# Patient Record
Sex: Female | Born: 1935 | Race: Black or African American | Hispanic: No | Smoking: Never smoker
Health system: Southern US, Community
[De-identification: ages and names within clinical notes are randomized; demographics above are authoritative.]

## PROBLEM LIST (undated history)

## (undated) DIAGNOSIS — G20A1 Parkinson's disease without dyskinesia, without mention of fluctuations: Secondary | ICD-10-CM

## (undated) DIAGNOSIS — Z8719 Personal history of other diseases of the digestive system: Secondary | ICD-10-CM

## (undated) DIAGNOSIS — R251 Tremor, unspecified: Secondary | ICD-10-CM

## (undated) DIAGNOSIS — Z87440 Personal history of urinary (tract) infections: Secondary | ICD-10-CM

## (undated) DIAGNOSIS — R32 Unspecified urinary incontinence: Secondary | ICD-10-CM

## (undated) DIAGNOSIS — I10 Essential (primary) hypertension: Secondary | ICD-10-CM

## (undated) DIAGNOSIS — G20 Parkinson's disease: Secondary | ICD-10-CM

## (undated) HISTORY — DX: Essential (primary) hypertension: I10

## (undated) HISTORY — DX: Parkinson's disease without dyskinesia, without mention of fluctuations: G20.A1

## (undated) HISTORY — DX: Parkinson's disease: G20

## (undated) HISTORY — PX: SHOULDER SURGERY: SHX246

## (undated) HISTORY — PX: COLONOSCOPY: SHX174

## (undated) HISTORY — DX: Unspecified urinary incontinence: R32

## (undated) HISTORY — DX: Tremor, unspecified: R25.1

## (undated) HISTORY — DX: Personal history of other diseases of the digestive system: Z87.19

## (undated) HISTORY — PX: KNEE SURGERY: SHX244

## (undated) HISTORY — DX: Personal history of urinary (tract) infections: Z87.440

---

## 2011-07-16 ENCOUNTER — Encounter: Payer: Self-pay | Admitting: Gastroenterology

## 2011-08-18 ENCOUNTER — Encounter: Payer: Self-pay | Admitting: Gastroenterology

## 2011-08-18 ENCOUNTER — Encounter: Payer: Self-pay | Admitting: Neurology

## 2011-08-18 ENCOUNTER — Ambulatory Visit (INDEPENDENT_AMBULATORY_CARE_PROVIDER_SITE_OTHER): Payer: No Typology Code available for payment source | Admitting: Gastroenterology

## 2011-08-18 ENCOUNTER — Other Ambulatory Visit (INDEPENDENT_AMBULATORY_CARE_PROVIDER_SITE_OTHER): Payer: No Typology Code available for payment source

## 2011-08-18 DIAGNOSIS — K5900 Constipation, unspecified: Secondary | ICD-10-CM | POA: Insufficient documentation

## 2011-08-18 DIAGNOSIS — R251 Tremor, unspecified: Secondary | ICD-10-CM

## 2011-08-18 DIAGNOSIS — R259 Unspecified abnormal involuntary movements: Secondary | ICD-10-CM

## 2011-08-18 LAB — COMPREHENSIVE METABOLIC PANEL
AST: 14 U/L (ref 0–37)
Albumin: 3.9 g/dL (ref 3.5–5.2)
Alkaline Phosphatase: 58 U/L (ref 39–117)
BUN: 11 mg/dL (ref 6–23)
Glucose, Bld: 92 mg/dL (ref 70–99)
Potassium: 3.6 mEq/L (ref 3.5–5.1)
Sodium: 143 mEq/L (ref 135–145)
Total Bilirubin: 0.8 mg/dL (ref 0.3–1.2)

## 2011-08-18 LAB — CBC WITH DIFFERENTIAL/PLATELET
Basophils Relative: 0.4 % (ref 0.0–3.0)
Eosinophils Absolute: 0 10*3/uL (ref 0.0–0.7)
HCT: 36.9 % (ref 36.0–46.0)
Lymphs Abs: 2 10*3/uL (ref 0.7–4.0)
MCHC: 33.3 g/dL (ref 30.0–36.0)
MCV: 95.8 fl (ref 78.0–100.0)
Monocytes Absolute: 0.5 10*3/uL (ref 0.1–1.0)
Neutrophils Relative %: 49.8 % (ref 43.0–77.0)
Platelets: 217 10*3/uL (ref 150.0–400.0)

## 2011-08-18 LAB — TSH: TSH: 0.8 u[IU]/mL (ref 0.35–5.50)

## 2011-08-18 MED ORDER — PEG-KCL-NACL-NASULF-NA ASC-C 100 G PO SOLR: 1.0000 | ORAL | Status: DC

## 2011-08-18 NOTE — Progress Notes (Signed)
HPI: This is a  very pleasant 76 year old woman who is here with her daughter today  About a year of constipation.  Sometimes a week to 2 between BMs.  Normal for her was q 2-3 days.  She noticed red blood after severe consipation, manual disempaction.  Drinking milk always helps.  They tried stool softeners, ginger, metamucil.    She has a tremor for at least a year.    Review of systems: Pertinent positive and negative review of systems were noted in the above HPI section.  All other review of systems was otherwise negative.   Past Medical History  Diagnosis Date  . Hypertension   . Tremor   . History of gallstones     Past Surgical History  Procedure Date  . Shoulder surgery      reports that she has never smoked. She has never used smokeless tobacco. She reports that she does not drink alcohol or use illicit drugs.  family history includes Diabetes in her father.  There is no history of Colon cancer.    Current Medications, Allergies were all reviewed with the patient via [facility] electronic medical record system.    Physical Exam: BP 142/62  Pulse 78  Ht 5\' 11"  (1.803 m)  Wt 190 lb (86.183 kg)  BMI 26.50 kg/m2 Constitutional: generally well-appearing Psychiatric: alert and oriented x3 Eyes: extraocular movements intact Mouth: oral pharynx moist, no lesions Neck: supple no lymphadenopathy Cardiovascular: heart regular rate and rhythm Lungs: clear to auscultation bilaterally Abdomen: soft, nontender, nondistended, no obvious ascites, no peritoneal signs, normal bowel sounds Extremities: no lower extremity edema bilaterally Skin: no lesions on visible extremities Rectal exam deferred for upcoming colonoscopy   Assessment and plan: 76 y.o. female with chronic constipation  Her daughter explained to me that she has really only tried very limited amounts of either MiraLax or fiber supplement. She actually only took one day of Metamucil before she decided  to stop using it. I explained to her that both of those products are usually very good at helping most constipation however they should be taken on a daily, chronic basis. She will retry fiber supplements. She has never had a colonoscopy and we will arrange for that to be done at her soonest convenience.  She also has a tremor in her left arm and her head is shaky, she was told by another caregiver that this was from "old age". She was not very satisfied with this and would like more expert opinion with a neurologist which I am happy to arrange for her.

## 2011-08-18 NOTE — Patient Instructions (Addendum)
Referral to neurology for tremor.  Dr Modesto Charon 08/27/11 930 am arrive 15 minutes early.  4th floor of [facility] You will be set up for a colonoscopy. Please start taking citrucel (orange flavored) powder fiber supplement.  This may cause some bloating at first but that usually goes away. Begin with a small spoonful and work your way up to a large, heaping spoonful daily over a week.  YOU SHOULD THEN TAKE THIS EVERY DAY. You will have labs checked today in the basement lab.  Please head down after you check out with the front desk  (cbc, cmet, tsh).

## 2011-08-23 ENCOUNTER — Telehealth: Payer: Self-pay | Admitting: Gastroenterology

## 2011-08-23 ENCOUNTER — Other Ambulatory Visit: Payer: Medicare Other | Admitting: Gastroenterology

## 2011-08-23 NOTE — Telephone Encounter (Signed)
She took a cab here for 1:30 colonoscopy.  Daughter will not be able to pick her up until 5:15.  I spoke with daughter on phone, we discussed rescheduling this for tomorrow AM at hospitial but daughter cannot be sure she can do that either.    Patty, can you get in touch with her, her daughter tomorrow about rescheduling this for a time that will work better for them.

## 2011-08-25 NOTE — Telephone Encounter (Signed)
Pt daughter will call back to schedule

## 2011-08-26 MED ORDER — PEG-KCL-NACL-NASULF-NA ASC-C 100 G PO SOLR: 1.0000 | ORAL | Status: DC

## 2011-08-26 NOTE — Telephone Encounter (Signed)
Pt rescheduled for 09/23/11 730 am daughter is aware [phone]  Instructions and free movi coupon at the front desk  meds reviewed and instructed over the phone

## 2011-08-27 ENCOUNTER — Ambulatory Visit (INDEPENDENT_AMBULATORY_CARE_PROVIDER_SITE_OTHER): Payer: Medicare Other | Admitting: Neurology

## 2011-08-27 ENCOUNTER — Encounter: Payer: Self-pay | Admitting: Neurology

## 2011-08-27 VITALS — BP 140/78 | HR 72 | Wt 192.0 lb

## 2011-08-27 DIAGNOSIS — G20 Parkinson's disease: Secondary | ICD-10-CM

## 2011-08-27 DIAGNOSIS — G20A1 Parkinson's disease without dyskinesia, without mention of fluctuations: Secondary | ICD-10-CM

## 2011-08-27 MED ORDER — CARBIDOPA-LEVODOPA 25-100 MG PO TABS: 1.0000 | ORAL_TABLET | Freq: Three times a day (TID) | ORAL | Status: DC

## 2011-08-27 NOTE — Patient Instructions (Signed)
Sinemet 25/100.   Start with 1/2 tab in the evening for 5 days,  Then increase to 1/2 tablet in the morning and evening for 5 days,  Then increase to 1/2 tablet three times a day for 5 days,  Then increase to 1/2 tab in the am., 1/2 tab in the afternoon, and 1 tablet at night for 5 days,  Then increase to 1/2 tab in the a.m., 1 tab in the afternoon and 1 tablet a night for 5 days,  Then increase to 1 tab three times a day from then on.
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
## 2011-09-22 ENCOUNTER — Telehealth: Payer: Self-pay | Admitting: Gastroenterology

## 2011-09-22 NOTE — Telephone Encounter (Signed)
Pt daughter called to say that her mother (the pt) had milk and oatmeal this morning.  I advised her that she can still have her procedure as scheduled just  make sure she follows the instructions for the rest of the night and tomorrow she agreed

## 2011-09-23 ENCOUNTER — Ambulatory Visit (HOSPITAL_COMMUNITY)
Admission: RE | Admit: 2011-09-23 | Discharge: 2011-09-23 | Disposition: A | Discharge status: Home / Self Care | Payer: No Typology Code available for payment source | Source: Ambulatory Visit | Attending: Gastroenterology | Admitting: Gastroenterology

## 2011-09-23 ENCOUNTER — Other Ambulatory Visit: Payer: Federal, State, Local not specified - PPO | Admitting: Gastroenterology

## 2011-09-23 ENCOUNTER — Telehealth: Payer: Self-pay | Admitting: Gastroenterology

## 2011-09-23 DIAGNOSIS — K5900 Constipation, unspecified: Secondary | ICD-10-CM

## 2011-09-23 DIAGNOSIS — K648 Other hemorrhoids: Secondary | ICD-10-CM | POA: Insufficient documentation

## 2011-09-23 DIAGNOSIS — K649 Unspecified hemorrhoids: Secondary | ICD-10-CM

## 2011-09-23 DIAGNOSIS — K5730 Diverticulosis of large intestine without perforation or abscess without bleeding: Secondary | ICD-10-CM | POA: Insufficient documentation

## 2011-09-23 NOTE — Telephone Encounter (Signed)
Previous message created in wrong chart disregard

## 2011-09-23 NOTE — Telephone Encounter (Signed)
Error
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
## 2011-10-12 ENCOUNTER — Encounter: Payer: Self-pay | Admitting: Neurology

## 2011-10-12 ENCOUNTER — Ambulatory Visit (INDEPENDENT_AMBULATORY_CARE_PROVIDER_SITE_OTHER): Payer: No Typology Code available for payment source | Admitting: Neurology

## 2011-10-12 VITALS — BP 144/80 | HR 64 | Wt 186.0 lb

## 2011-10-12 DIAGNOSIS — G20 Parkinson's disease: Secondary | ICD-10-CM

## 2011-10-12 DIAGNOSIS — G20A1 Parkinson's disease without dyskinesia, without mention of fluctuations: Secondary | ICD-10-CM

## 2011-10-12 MED ORDER — CARBIDOPA-LEVODOPA 25-100 MG PO TABS: 1.0000 | ORAL_TABLET | Freq: Three times a day (TID) | ORAL | Status: DC

## 2011-10-12 NOTE — Progress Notes (Signed)
Dear Dr. Concepcion Elk,  I saw  Meagan [name] back in [facility] for her problem with presumed tremor predominant Parkinson's disease.  As you may recall, she is a 76 y.o. year old female with a one year history of worsening tremors.  This has been accompanied by difficulty walking and getting out of chairs.  She has not had any falls.  At her last visit I felt she likely had Parkinson's disease and started her on Sinemet 100/25 tid.  She has noticed a marked change in her tremors, although they have not been taken away entirely.  Her tremor involves her jaw, arms and legs.  She is unsure whether it has helped her walking.  She denies obvious wearing off phenomenon.  She is having no lightheadness or abnormal dreams.  She denies dystonia or dyskinesias.  She is taking the Sinemet at 8:00 a.m., 3 p.m., and 8-9 p.m.    Medical history, social history, family history, medications and allergies were reviewed and have not changed since the last clinic vist.  ROS:  13 systems were reviewed and are notable for constipation.  All other review of systems are unremarkable.  Exam: . Filed Vitals:   10/12/11 0919  BP: 144/80  Pulse: 64  Weight: 186 lb (84.369 kg)    In general, well appearing older woman.   Cranial Nerves: Extraocular movements are intact without nystagmus, downward saccades ar normal. Facial sensation and muscles of mastication are intact. Muscles of facial expression are symmetric but with a decreased blink frequency.  Tongue protrusion, uvula, palate midline.  Shoulder shrug intact  Motor:  Mildly increased axial rigidity.  Mild cogwheeling right greater than left.  Bradykinesia with FFM.  Resting tremor without significant postural component(it seems worse on the left today).      Gait:  Narrow gait.  Has difficulty getting out of a chair.  Some mild retropulsion.  Worsening tremor on right with walking, reduced arm swing.  Impression:  Tremor predominant  Parkinson's disease  Recommendations:  I think she needs more Sinemet.  However, at this time she is unwilling to increase her dose.  However I have asked her to move her Sinemet dosing to 8,1,6 as there is no point in taking her Sinemet just before she sleeps(unless of course she is having motor phenomenon) that is bothering her at night.  We will see the patient back in 3 months.  Lupita Raider Modesto Charon, MD [facility], [place]

## 2011-10-12 NOTE — Patient Instructions (Signed)
They will call you call you with your appointments for physical therapy.

## 2012-01-12 ENCOUNTER — Ambulatory Visit: Payer: Medicare Other | Admitting: Neurology

## 2012-01-20 ENCOUNTER — Ambulatory Visit: Payer: Self-pay | Admitting: Neurology

## 2012-01-20 ENCOUNTER — Telehealth: Payer: Self-pay | Admitting: Neurology

## 2012-01-20 NOTE — Telephone Encounter (Signed)
Pt's ride never showed up for her appt today (01/20/2012) so we had to reschedule her for 02/08/2012. She would like to know if she can be worked in sooner.

## 2012-01-28 NOTE — Telephone Encounter (Signed)
ok 

## 2012-01-31 NOTE — Telephone Encounter (Signed)
Pt confirmed that she would be at her scheduled appointment on 02/08/2012. She has arranged for her ride to pick her up on that day. Removing pt from wait list, per her voicemail.

## 2012-02-08 ENCOUNTER — Ambulatory Visit (INDEPENDENT_AMBULATORY_CARE_PROVIDER_SITE_OTHER): Payer: No Typology Code available for payment source | Admitting: Neurology

## 2012-02-08 ENCOUNTER — Encounter: Payer: Self-pay | Admitting: Neurology

## 2012-02-08 DIAGNOSIS — G20 Parkinson's disease: Secondary | ICD-10-CM

## 2012-02-08 DIAGNOSIS — G20A1 Parkinson's disease without dyskinesia, without mention of fluctuations: Secondary | ICD-10-CM | POA: Insufficient documentation

## 2012-02-08 MED ORDER — CARBIDOPA-LEVODOPA 25-100 MG PO TABS: 2.0000 | ORAL_TABLET | Freq: Three times a day (TID) | ORAL | Status: DC

## 2012-02-08 NOTE — Progress Notes (Signed)
Addended by: Lelon Huh on: 02/08/2012 02:17 PM   Modules accepted: Orders

## 2012-02-08 NOTE — Patient Instructions (Signed)
  Sinemet 100/25 tabs  take 2 in the am, 1 at lunch, 1 at dinner for 5 days, then take 2 in the am., 2 at lunch and 1 at dinner for 5 days, then take 2 in the a.m., 2 at lunch and 2 at dinner from then on.

## 2012-02-08 NOTE — Progress Notes (Signed)
Dear Dr. Concepcion Elk,  I saw  [name] back in [facility] for her problem with Parkinson's disease.  As you may recall, she is a 77 y.o. year old female with a history of over 1 year of tremor and slowness of gait.  At her last visit she felt her tremor was better controlled on the Sinemet and didn't want to increase it.  However, in the interval she thinks she might need to increase it.  She does feel that the Sinemet helps still.  She is unsure if it helps her gait.  She denies wearing off phenomenon.  She has had no falls.  She is having no adverse effects of the medication.   Medical history, social history, and family history were reviewed and have not changed since the last clinic visit.  Current Outpatient Prescriptions on File Prior to Visit  Medication Sig Dispense Refill  . peg 3350 powder (MOVIPREP) 100 G SOLR Take 1 kit (100 g total) by mouth as directed. See written handout  1 kit  0  . psyllium (METAMUCIL) 58.6 % packet Take 1 packet by mouth daily.        . Sennosides (EX-LAX PO) Take by mouth as needed.        - Sinemet 100/25 tid - 7:30; 1:30; 6 p.m.  No Known Allergies  ROS:  13 systems were reviewed and are notable for right knee pain that is chronic.  All other review of systems are unremarkable.  Exam: . Filed Vitals:   02/08/12 0924  BP: 150/80  Pulse: 76  Height: 5\' 11"  (1.803 m)  Weight: 177 lb (80.287 kg)    In general, well nourished appearing.    Cranial Nerves: Extraocular movements are intact without nystagmus.Muscles of facial expression are symmetric. Decreased blink frequency.   Tongue protrusion, uvula, palate midline.  Shoulder shrug leads on the right.  She does have a head and jaw tremor.  Motor:  Coghweeling left > right.  Postural and resting tremor on left > right.  Reflexes:  2+ thoughout, except absent ankles.    Gait:  Stooped gait and station.  Cannot rise from a chair without using arms.  Mild retropulsion.   Turns slowly.  Left hand tremor increased with walking.  Impression/Recommendations: 1.  Parkinson's - I will increase her Sinemet to 100/25 2 tabs tid.  I am also going to send her to the Parkinson's physical therapists.  We will see the patient back in 3 months.  Lupita Raider Modesto Charon, MD [facility], [place]

## 2012-02-14 ENCOUNTER — Encounter: Payer: No Typology Code available for payment source | Admitting: Occupational Therapy

## 2012-02-22 ENCOUNTER — Ambulatory Visit: Payer: No Typology Code available for payment source | Attending: Neurology | Admitting: Occupational Therapy

## 2012-02-22 DIAGNOSIS — M2420 Disorder of ligament, unspecified site: Secondary | ICD-10-CM | POA: Insufficient documentation

## 2012-02-22 DIAGNOSIS — G20 Parkinson's disease: Secondary | ICD-10-CM | POA: Insufficient documentation

## 2012-02-22 DIAGNOSIS — Z5189 Encounter for other specified aftercare: Secondary | ICD-10-CM | POA: Insufficient documentation

## 2012-02-22 DIAGNOSIS — R4189 Other symptoms and signs involving cognitive functions and awareness: Secondary | ICD-10-CM | POA: Insufficient documentation

## 2012-02-22 DIAGNOSIS — G20A1 Parkinson's disease without dyskinesia, without mention of fluctuations: Secondary | ICD-10-CM | POA: Insufficient documentation

## 2012-02-22 DIAGNOSIS — M629 Disorder of muscle, unspecified: Secondary | ICD-10-CM | POA: Insufficient documentation

## 2012-02-22 DIAGNOSIS — R269 Unspecified abnormalities of gait and mobility: Secondary | ICD-10-CM | POA: Insufficient documentation

## 2012-02-23 ENCOUNTER — Ambulatory Visit: Payer: No Typology Code available for payment source | Admitting: Physical Therapy

## 2012-02-28 ENCOUNTER — Encounter: Payer: No Typology Code available for payment source | Admitting: Occupational Therapy

## 2012-02-29 ENCOUNTER — Telehealth: Payer: Self-pay | Admitting: Neurology

## 2012-02-29 NOTE — Telephone Encounter (Signed)
Pt is shaking a lot with taking her medication. She states that 1 pill is fine, but if she takes 2, she gets a tremor. Please call back for more details.

## 2012-03-01 ENCOUNTER — Encounter: Payer: No Typology Code available for payment source | Admitting: Occupational Therapy

## 2012-03-01 NOTE — Telephone Encounter (Signed)
Spoke with pt who said she is fine now, she thinks she took the med incorrectly before.  She again stated she was fine now.  I told her to call us back if she had any problems or concerns.

## 2012-03-06 ENCOUNTER — Ambulatory Visit: Payer: No Typology Code available for payment source | Admitting: Physical Therapy

## 2012-03-06 ENCOUNTER — Encounter: Payer: No Typology Code available for payment source | Admitting: Occupational Therapy

## 2012-03-13 ENCOUNTER — Encounter: Payer: No Typology Code available for payment source | Admitting: Occupational Therapy

## 2012-03-13 ENCOUNTER — Ambulatory Visit: Payer: No Typology Code available for payment source | Admitting: Physical Therapy

## 2012-03-15 ENCOUNTER — Encounter: Payer: No Typology Code available for payment source | Admitting: Occupational Therapy

## 2012-03-15 ENCOUNTER — Ambulatory Visit: Payer: No Typology Code available for payment source | Admitting: Physical Therapy

## 2012-03-20 ENCOUNTER — Ambulatory Visit: Payer: No Typology Code available for payment source | Admitting: Physical Therapy

## 2012-03-20 ENCOUNTER — Encounter: Payer: No Typology Code available for payment source | Admitting: Occupational Therapy

## 2012-03-23 ENCOUNTER — Encounter: Payer: No Typology Code available for payment source | Admitting: Occupational Therapy

## 2012-03-23 ENCOUNTER — Ambulatory Visit: Payer: No Typology Code available for payment source | Admitting: Physical Therapy

## 2012-03-27 ENCOUNTER — Ambulatory Visit: Payer: No Typology Code available for payment source | Admitting: Physical Therapy

## 2012-03-27 ENCOUNTER — Encounter: Payer: No Typology Code available for payment source | Admitting: Occupational Therapy

## 2012-03-29 ENCOUNTER — Encounter: Payer: No Typology Code available for payment source | Admitting: Occupational Therapy

## 2012-03-29 ENCOUNTER — Ambulatory Visit: Payer: No Typology Code available for payment source | Admitting: Physical Therapy

## 2012-04-05 ENCOUNTER — Telehealth: Payer: Self-pay | Admitting: Gastroenterology

## 2012-04-05 NOTE — Telephone Encounter (Signed)
i agree, thanks 

## 2012-04-05 NOTE — Telephone Encounter (Signed)
Pt is very constipated (small hard stool since last week) and has only been using Miralax 1/2 cap at night.  She has no pain, fever or any other GI complaints.  I advised her to take 2 capfuls of miralax today and tomorrow and if she does not have a bowel movement by Thursday to call back.  She is also taking stool softener as well.  Pt agreed to call with an update and call if any fever or abd pain begins.

## 2012-04-06 ENCOUNTER — Other Ambulatory Visit: Payer: Self-pay | Admitting: Neurology

## 2012-04-06 ENCOUNTER — Telehealth: Payer: Self-pay | Admitting: Neurology

## 2012-04-06 MED ORDER — CARBIDOPA-LEVODOPA 25-100 MG PO TABS: 2.0000 | ORAL_TABLET | Freq: Three times a day (TID) | ORAL | Status: DC

## 2012-04-06 NOTE — Telephone Encounter (Signed)
Med called in. Patient aware 

## 2012-04-06 NOTE — Telephone Encounter (Signed)
Pt called and reports that she had a good bowel movement and thanked me for helping her.  I advised her to stay on the miralax 2 caps today and then go back to daily.  She will call with any further problems or concerns

## 2012-04-06 NOTE — Telephone Encounter (Signed)
Pt needs sinemet refill stat. She is going out of town and needs the meds before she leaves. Pharmacy needs our approval because pt has met quota. Please send to [facility] pharmacy listed in system. Please call pt with questions on number listed below or mobile number on account.

## 2012-04-07 ENCOUNTER — Other Ambulatory Visit: Payer: Self-pay | Admitting: Neurology

## 2012-04-11 ENCOUNTER — Telehealth: Payer: Self-pay | Admitting: Neurology

## 2012-04-11 ENCOUNTER — Other Ambulatory Visit: Payer: Self-pay | Admitting: Neurology

## 2012-04-11 MED ORDER — CARBIDOPA-LEVODOPA 25-100 MG PO TABS: 1.0000 | ORAL_TABLET | Freq: Three times a day (TID) | ORAL | Status: DC

## 2012-04-11 NOTE — Telephone Encounter (Signed)
have them drop it back to 1 pill tid.  if she doesn't get better with that we can stop it -- but have them call us before we do that.

## 2012-04-11 NOTE — Telephone Encounter (Signed)
Called and spoke with the patient, patient's daughter and son. Patient's children report significant with loss, constipation and no appetite as a result of the medication she is taking (Sinemet 25/100 mg). The patient says she has had these side  effects since she started taking 2 pills TID (that was in March). Patient's children both state her weight loss as significant (weighed 177 in March 2013 and 190 in September 2012). They would like to see her current medication changed to a lower dose or either changed to another medication with less side effects. PD symptoms are well controlled; denies any falls. The patient has a f/u scheduled in early June. I told them that I would inform Dr. Modesto Charon of the patient's situation and we would be in touch. They were all in agreement with this plan. **Dr. Modesto Charon, please advise. Thank you.

## 2012-04-11 NOTE — Telephone Encounter (Signed)
Called and spoke with the patient and the patient's daughter. Information given as directed by Dr. Modesto Charon re: decreasing Sinemet 25/100 mg from 2 TID to 1 TID. They will try this for 7 to 10 days and call us back with an update of her condition. Asked them not to stop the med all at once. Voiced understanding.

## 2012-04-11 NOTE — Telephone Encounter (Signed)
Spoke with pt and pt's daughter. They are concerned that the Sinemet Dr. Modesto Charon prescribed is causing pt to lose weight. Fu appt is scheduled for 05/16/2012. She isn't sure if she needs a med adjustment or just needs to come in for appt.

## 2012-04-25 ENCOUNTER — Telehealth: Payer: Self-pay | Admitting: Neurology

## 2012-04-25 NOTE — Telephone Encounter (Signed)
Received a voice mail message 04/24/12 at 3:20 pm from the patient's daughter asking to give them a call for an update on her medication. I called this morning and spoke with the patient. She tells me her daughter is not there. Patient reports that since decreasing her Sinement from 2 TID to 1 TID she is feeling better; shaking less. Appetite is improved and she says she is eating. She still thinks she has room for improvement. She states she will stay at this dose. I told her I would let Dr. Modesto Charon know she is better and if I had anything to share with her I would be in touch. She agrees with this plan. I also told her that if her daughter wanted to call me to let her know that would be fine. No other issues voiced at this time. **Dr. Modesto Charon.....FYI.

## 2012-05-03 ENCOUNTER — Telehealth: Payer: Self-pay

## 2012-05-03 NOTE — Telephone Encounter (Signed)
Daughter called to update you on the decreased dose of her med and she is doing better on it.  Has increased appetite and her parkinsons is doing well also.

## 2012-05-16 ENCOUNTER — Ambulatory Visit: Payer: No Typology Code available for payment source | Admitting: Neurology

## 2012-05-18 ENCOUNTER — Telehealth: Payer: Self-pay | Admitting: Neurology

## 2012-05-18 NOTE — Telephone Encounter (Signed)
Tremors are increasing since dosage of meds was changed. Patient cancelled previous fu appt. Rescheduled for 07/27/2012 because pt needed appointment time after 12 pm. Please call patient directly to advise at home phone number.

## 2012-05-19 NOTE — Telephone Encounter (Signed)
Called and spoke with the patient. She states she is doing ok today as she just thinks she got over-heated yesterday. She is taking the Carbo-Levo 25/100 one TID. She states she is doing fine and her appetite is good. Appointment with Dr. Modesto Charon in August. Asked that she call us with any problems. She states she will.

## 2012-06-04 ENCOUNTER — Inpatient Hospital Stay (HOSPITAL_COMMUNITY)
Admission: EM | Admit: 2012-06-04 | Discharge: 2012-06-07 | DRG: 690 | Disposition: A | Discharge status: Rehabilitation Facility | Payer: No Typology Code available for payment source | Attending: Internal Medicine | Admitting: Internal Medicine

## 2012-06-04 ENCOUNTER — Encounter (HOSPITAL_COMMUNITY): Payer: Self-pay

## 2012-06-04 ENCOUNTER — Emergency Department (HOSPITAL_COMMUNITY): Payer: No Typology Code available for payment source

## 2012-06-04 DIAGNOSIS — B964 Proteus (mirabilis) (morganii) as the cause of diseases classified elsewhere: Secondary | ICD-10-CM | POA: Diagnosis present

## 2012-06-04 DIAGNOSIS — A498 Other bacterial infections of unspecified site: Secondary | ICD-10-CM | POA: Diagnosis present

## 2012-06-04 DIAGNOSIS — G20A1 Parkinson's disease without dyskinesia, without mention of fluctuations: Secondary | ICD-10-CM

## 2012-06-04 DIAGNOSIS — E876 Hypokalemia: Secondary | ICD-10-CM

## 2012-06-04 DIAGNOSIS — G20 Parkinson's disease: Secondary | ICD-10-CM | POA: Diagnosis present

## 2012-06-04 DIAGNOSIS — N390 Urinary tract infection, site not specified: Principal | ICD-10-CM

## 2012-06-04 DIAGNOSIS — R5381 Other malaise: Secondary | ICD-10-CM

## 2012-06-04 DIAGNOSIS — A419 Sepsis, unspecified organism: Secondary | ICD-10-CM

## 2012-06-04 DIAGNOSIS — R509 Fever, unspecified: Secondary | ICD-10-CM

## 2012-06-04 DIAGNOSIS — I10 Essential (primary) hypertension: Secondary | ICD-10-CM | POA: Diagnosis present

## 2012-06-04 DIAGNOSIS — A499 Bacterial infection, unspecified: Secondary | ICD-10-CM | POA: Diagnosis present

## 2012-06-04 LAB — URINALYSIS, ROUTINE W REFLEX MICROSCOPIC
Bilirubin Urine: NEGATIVE
Ketones, ur: NEGATIVE mg/dL
Nitrite: POSITIVE — AB
Protein, ur: 30 mg/dL — AB
Urobilinogen, UA: 0.2 mg/dL (ref 0.0–1.0)

## 2012-06-04 LAB — DIFFERENTIAL
Basophils Absolute: 0 10*3/uL (ref 0.0–0.1)
Basophils Relative: 0 % (ref 0–1)
Eosinophils Absolute: 0 10*3/uL (ref 0.0–0.7)
Eosinophils Relative: 0 % (ref 0–5)
Lymphocytes Relative: 15 % (ref 12–46)

## 2012-06-04 LAB — CBC
MCH: 31.4 pg (ref 26.0–34.0)
MCHC: 33.4 g/dL (ref 30.0–36.0)
MCV: 94 fL (ref 78.0–100.0)
Platelets: 232 10*3/uL (ref 150–400)
RDW: 13.5 % (ref 11.5–15.5)
WBC: 9.4 10*3/uL (ref 4.0–10.5)

## 2012-06-04 LAB — COMPREHENSIVE METABOLIC PANEL
ALT: 24 U/L (ref 0–35)
AST: 22 U/L (ref 0–37)
Albumin: 3.5 g/dL (ref 3.5–5.2)
Alkaline Phosphatase: 70 U/L (ref 39–117)
BUN: 14 mg/dL (ref 6–23)
CO2: 25 mEq/L (ref 19–32)
CO2: 24 mEq/L (ref 19–32)
Calcium: 8.5 mg/dL (ref 8.4–10.5)
Chloride: 101 mEq/L (ref 96–112)
Creatinine, Ser: 0.7 mg/dL (ref 0.50–1.10)
GFR calc Af Amer: >90 mL/min (ref >90)
GFR calc non Af Amer: 83 mL/min — ABNORMAL LOW (ref >90)
GFR calc non Af Amer: 83 mL/min — ABNORMAL LOW (ref >90)
Glucose, Bld: 118 mg/dL — ABNORMAL HIGH (ref 70–99)
Potassium: 3.4 mEq/L — ABNORMAL LOW (ref 3.5–5.1)
Sodium: 140 mEq/L (ref 135–145)
Total Bilirubin: 0.4 mg/dL (ref 0.3–1.2)
Total Bilirubin: 0.3 mg/dL (ref 0.3–1.2)

## 2012-06-04 LAB — CBC WITH DIFFERENTIAL/PLATELET
Basophils Absolute: 0 10*3/uL (ref 0.0–0.1)
Eosinophils Absolute: 0 10*3/uL (ref 0.0–0.7)
Eosinophils Relative: 0 % (ref 0–5)
MCH: 31.4 pg (ref 26.0–34.0)
MCV: 92.9 fL (ref 78.0–100.0)
Neutrophils Relative %: 81 % — ABNORMAL HIGH (ref 43–77)
Platelets: 264 10*3/uL (ref 150–400)
RDW: 13.3 % (ref 11.5–15.5)
WBC: 8.8 10*3/uL (ref 4.0–10.5)

## 2012-06-04 LAB — APTT: aPTT: 34 seconds (ref 24–37)

## 2012-06-04 LAB — URINE MICROSCOPIC-ADD ON

## 2012-06-04 LAB — LACTIC ACID, PLASMA: Lactic Acid, Venous: 1 mmol/L (ref 0.5–2.2)

## 2012-06-04 MED ORDER — CARBIDOPA-LEVODOPA 25-100 MG PO TABS
1.0000 | ORAL_TABLET | Freq: Three times a day (TID) | ORAL | Status: DC
  Administered 2012-06-04 – 2012-06-07 (×8): 1 via ORAL
  Filled 2012-06-04 (×10): qty 1

## 2012-06-04 MED ORDER — PSYLLIUM 95 % PO PACK
1.0000 | PACK | Freq: Every day | ORAL | Status: DC
  Administered 2012-06-04 – 2012-06-07 (×4): 1 via ORAL
  Filled 2012-06-04 (×4): qty 1

## 2012-06-04 MED ORDER — ACETAMINOPHEN 650 MG RE SUPP: 650.0000 mg | Freq: Four times a day (QID) | RECTAL | Status: DC | PRN

## 2012-06-04 MED ORDER — ACETAMINOPHEN 325 MG PO TABS
650.0000 mg | ORAL_TABLET | Freq: Four times a day (QID) | ORAL | Status: DC | PRN
  Administered 2012-06-04: 650 mg via ORAL
  Filled 2012-06-04: qty 2

## 2012-06-04 MED ORDER — POTASSIUM CHLORIDE 10 MEQ/100ML IV SOLN: 10.0000 meq | INTRAVENOUS | Status: DC

## 2012-06-04 MED ORDER — SODIUM CHLORIDE 0.9 % IV SOLN
INTRAVENOUS | Status: DC
  Administered 2012-06-04 – 2012-06-07 (×9): via INTRAVENOUS

## 2012-06-04 MED ORDER — WHITE PETROLATUM GEL
Status: AC
  Administered 2012-06-04: 18:00:00
  Filled 2012-06-04: qty 5

## 2012-06-04 MED ORDER — POTASSIUM CHLORIDE 10 MEQ/100ML IV SOLN
10.0000 meq | INTRAVENOUS | Status: AC
  Administered 2012-06-04 (×2): 10 meq via INTRAVENOUS
  Filled 2012-06-04 (×7): qty 100

## 2012-06-04 MED ORDER — ACETAMINOPHEN 650 MG RE SUPP
1300.0000 mg | Freq: Once | RECTAL | Status: AC
  Administered 2012-06-04: 1300 mg via RECTAL
  Filled 2012-06-04: qty 2

## 2012-06-04 MED ORDER — SODIUM CHLORIDE 0.9 % IV BOLUS (SEPSIS)
2000.0000 mL | Freq: Once | INTRAVENOUS | Status: AC
  Administered 2012-06-04: 1000 mL via INTRAVENOUS

## 2012-06-04 MED ORDER — DEXTROSE 5 % IV SOLN
2.0000 g | INTRAVENOUS | Status: DC
  Administered 2012-06-05 – 2012-06-06 (×2): 2 g via INTRAVENOUS
  Filled 2012-06-04 (×3): qty 2

## 2012-06-04 MED ORDER — DEXTROSE 5 % IV SOLN
2.0000 g | Freq: Once | INTRAVENOUS | Status: AC
  Administered 2012-06-04: 2 g via INTRAVENOUS
  Filled 2012-06-04: qty 2

## 2012-06-04 NOTE — H&P (Addendum)
[facility] History and Physical  Meagan [name] ZOX:[id number] DOB: [birth year] DOA: 06/04/2012  Referring physician: ED PCP: Dorrene German, MD   Chief Complaint: fever  HPI:  77 year old female with history of Parkinson's disease who was brought to ED due to acute onset unresponsives at home. Patient is not a good historian due to her mental status so history is obtain through ED physician. Further reports suggest normal mental status 1 day prior to admission.  Assessment/Plan  Principal Problem:  *Fever - follow up urinalysis and urine culture - follow up blood culture results - ceftriaxone started in ED  Active Problems:  Parkinson disease - continue carbidopa  Hypokalemia - will supplement - follow up BMET in am  Code Status: full code Family Communication: none at bedside Disposition Plan: will follow up with PT evaluation  Review of Systems:  Unable to assess as patient is non verbal at this time     Past Medical History  Diagnosis Date  . Hypertension   . Tremor   . History of gallstones    Past Surgical History  Procedure Date  . Shoulder surgery   . Knee surgery   . Colonoscopy    Social History:  reports that she has never smoked. She has never used smokeless tobacco. She reports that she does not drink alcohol or use illicit drugs.  No Known Allergies  Family History  Problem Relation Age of Onset  . Diabetes Father   . Colon cancer Neg Hx     Prior to Admission medications   Medication Sig Start Date End Date Taking? Authorizing Provider  carbidopa-levodopa (SINEMET) 25-100 MG per tablet Take 1 tablet by mouth 3 (three) times daily. 04/11/12 04/11/13 Yes Milas Gain, MD  psyllium (METAMUCIL) 58.6 % packet Take 1 packet by mouth daily.     Yes Historical Provider, MD  Sennosides (EX-LAX PO) Take by mouth as needed.     Yes Historical Provider, MD   Physical Exam: Filed Vitals:   06/04/12 1500 06/04/12 1515 06/04/12 1530 06/04/12  1545  BP: 149/62 148/65 156/68 155/66  Pulse: 90 97 85 91  Temp: 104 F (40 C) 103.7 F (39.8 C) 103.3 F (39.6 C) 103.2 F (39.6 C)  TempSrc:      Resp: 25 23 22 22   SpO2: 94% 95% 95% 93%     General:  No acute distress, appears stated age  Eyes: PERRLA, conjunctiva normal  ENT: no tonsillar erythema or exudates, no carotid bruits  Neck: supple, no lymphadenopathy  Cardiovascular: S1, S2, regular rate and rhythm  Respiratory: clear to ausculation bilaterally, no wheezing  Abdomen: non tender, non distended, (+) BS  Skin: warm, dry  Musculoskeletal: no erythema or joint deformity  Psychiatric: unable to test due to mental status  Neurologic: no focal neurologic deficits, awake  Labs on Admission:  Basic Metabolic Panel:  Lab 06/04/12 8119  NA 137  K 3.4*  CL 101  CO2 25  GLUCOSE 122*  BUN 14  CREATININE 0.70  CALCIUM 9.6  MG --  PHOS --   Liver Function Tests:  Lab 06/04/12 1335  AST 22  ALT 20  ALKPHOS 70  BILITOT 0.4  PROT 7.7  ALBUMIN 3.5   CBC:  Lab 06/04/12 1335  WBC 8.8  NEUTROABS 7.1  HGB 13.3  HCT 39.4  MCV 92.9  PLT 264   CBG:  Lab 06/04/12 1317  GLUCAP 120*    Radiological Exams on Admission: [facility] 1 [address]  06/04/2012    IMPRESSION: 1.  No radiographic evidence of acute cardiopulmonary disease. 2.  Atherosclerosis.   Manson Passey, MD  [facility] Pager [phone]  If 7PM-7AM, please contact night-coverage [email or web address] Password [facility] 06/04/2012, 4:34 PM  Time spent: 45 minutes

## 2012-06-04 NOTE — ED Provider Notes (Addendum)
History     CSN: [id number]  Arrival date & time 06/04/12  1257   First MD Initiated Contact with Patient 06/04/12 1312      Chief Complaint  Patient presents with  . Fever   of level V caveat altered mental status  (Consider location/radiation/quality/duration/timing/severity/associated sxs/prior treatment) HPI Patient was found by her daughter today 11 AM less responsive, unable to walk or speak. Patient had done well prior to this morning. States late last night watching movies. Brought by EMS. No treatment prior to coming here. History of Alzheimer's disease in contradiction to nurse's notes. Past Medical History  Diagnosis Date  . Hypertension   . Tremor   . History of gallstones    Parkinson's disease  Past Surgical History  Procedure Date  . Shoulder surgery   . Knee surgery   . Colonoscopy    cholecystectomy  Family History  Problem Relation Age of Onset  . Diabetes Father   . Colon cancer Neg Hx     History  Substance Use Topics  . Smoking status: Never Smoker   . Smokeless tobacco: Never Used  . Alcohol Use: No    OB History    Grav Para Term Preterm Abortions TAB SAB Ect Mult Living                  Review of Systems  Unable to perform ROS: Mental status change    Allergies  Review of patient's allergies indicates no known allergies.  Home Medications   Current Outpatient Rx  Name Route Sig Dispense Refill  . CARBIDOPA-LEVODOPA 25-100 MG PO TABS Oral Take 1 tablet by mouth 3 (three) times daily. 90 tablet 6  . PSYLLIUM 58.6 % PO PACK Oral Take 1 packet by mouth daily.      Marland Kitchen EX-LAX PO Oral Take by mouth as needed.        BP 165/82  Pulse 89  Temp 103.5 F (39.7 C) (Rectal)  Resp 22  SpO2 97%  Physical Exam  Nursing note and vitals reviewed. Constitutional: She appears well-developed and well-nourished.  HENT:  Head: Normocephalic and atraumatic.       Mucous membranes dry  Eyes: Conjunctivae are normal. Pupils are equal, round,  and reactive to light.  Neck: Neck supple. No tracheal deviation present. No thyromegaly present.  Cardiovascular: Normal rate and regular rhythm.   No murmur heard. Pulmonary/Chest: Effort normal and breath sounds normal.  Abdominal: Soft. Bowel sounds are normal. She exhibits no distension. There is no tenderness.  Musculoskeletal: Normal range of motion. She exhibits no edema and no tenderness.  Neurological: She is alert. Coordination normal.       Opens eyes to verbal stimulus, follow simple commands, nonverbal, moves all extremities, cogwheeling with active motion of extremities   Skin: Skin is warm and dry. No rash noted.  Psychiatric: She has a normal mood and affect.    ED Course  Procedures (including critical care time) 4:30 PM After treatment with intravenous fluids and antibiotics patient is alert answers questions sitting up in bed Glasgow Coma Score 15 Labs Reviewed  GLUCOSE, CAPILLARY - Abnormal; Notable for the following:    Glucose-Capillary 120 (*)     All other components within normal limits  CBC WITH DIFFERENTIAL - Abnormal; Notable for the following:    Neutrophils Relative 81 (*)     Lymphocytes Relative 11 (*)     All other components within normal limits  COMPREHENSIVE METABOLIC PANEL  CULTURE, BLOOD (ROUTINE  X 2)  CULTURE, BLOOD (ROUTINE X 2)  URINALYSIS, ROUTINE W REFLEX MICROSCOPIC  URINE CULTURE  LACTIC ACID, PLASMA   No results found.   No diagnosis found.    MDM  Spoke with Dr.Devine Plan admit medical surgical floor Diagnosis #1 sepsis #2 urinary tract infection  #3 hypokalemia      Doug Sou, MD 06/04/12 1641  Doug Sou, MD 06/04/12 [phone]

## 2012-06-04 NOTE — ED Notes (Signed)
Pt found poorly responsive and unable to ambulate after waking from nap @ 1100. Daughter states pt awake, talking and ambulated to bathroom at 0900 this a.m. Pt does have Alzheimer's disease but usually is up and carries on normal conversations on a daily basis. Pt is hot to touch, unable to assist w/ turning, and unable to verbalize at this time.

## 2012-06-04 NOTE — ED Notes (Signed)
ZOX:[id number]<AV> Expected date:06/04/12<BR> Expected time:12:39 PM<BR> Means of arrival:Ambulance<BR> Comments:<BR> Parkinson, weakness, not acting right

## 2012-06-04 NOTE — ED Notes (Signed)
Correction of previous entry, pt has Parkinson's disease not Alzheimers

## 2012-06-05 DIAGNOSIS — G20 Parkinson's disease: Secondary | ICD-10-CM

## 2012-06-05 DIAGNOSIS — A419 Sepsis, unspecified organism: Secondary | ICD-10-CM

## 2012-06-05 DIAGNOSIS — R509 Fever, unspecified: Secondary | ICD-10-CM

## 2012-06-05 LAB — CBC
Hemoglobin: 11.3 g/dL — ABNORMAL LOW (ref 12.0–15.0)
MCHC: 32.7 g/dL (ref 30.0–36.0)
Platelets: 203 10*3/uL (ref 150–400)
RBC: 3.65 MIL/uL — ABNORMAL LOW (ref 3.87–5.11)

## 2012-06-05 LAB — COMPREHENSIVE METABOLIC PANEL
CO2: 21 mEq/L (ref 19–32)
Calcium: 8.5 mg/dL (ref 8.4–10.5)
Chloride: 109 mEq/L (ref 96–112)
Creatinine, Ser: 0.63 mg/dL (ref 0.50–1.10)
GFR calc Af Amer: >90 mL/min (ref >90)
GFR calc non Af Amer: 86 mL/min — ABNORMAL LOW (ref >90)
Glucose, Bld: 122 mg/dL — ABNORMAL HIGH (ref 70–99)
Total Bilirubin: 0.4 mg/dL (ref 0.3–1.2)

## 2012-06-05 LAB — GLUCOSE, CAPILLARY: Glucose-Capillary: 129 mg/dL — ABNORMAL HIGH (ref 70–99)

## 2012-06-05 NOTE — Progress Notes (Addendum)
Patient ID: Meagan [name], female   DOB: 04-04-1935, 77 y.o.   MRN: [id number] [facility] PROGRESS NOTE  Meagan [name] [id number] DOB: 07/11/1935 DOA: 06/04/2012 PCP: Dorrene German, MD  Assessment/Plan   Principal Problem:  *Fever  - follow up urinalysis and urine culture  - follow up blood culture results - no growth to date - ceftriaxone started in ED   Active Problems:  Parkinson disease  - continue carbidopa   Hypokalemia  - resolved  Code Status: full code  Family Communication: none at bedside  Disposition Plan: will follow up with PT evaluation   Manson Passey, MD  [facility] Pager [phone]  If 7PM-7AM, please contact night-coverage [email or web address] Password [email or web address] 06/05/2012, 7:24 PM   LOS: 1 day   Consultants:  Physical therapy - recommends inpatient rehab  Procedures:  none  Antibiotics:  Ceftriaxone 06/04/2012 -->  HPI/Subjective: No acute events overnight.  Objective: Filed Vitals:   06/05/12 0400 06/05/12 0705 06/05/12 1429 06/05/12 1847  BP:  144/64 139/75   Pulse:  94 83   Temp: 99.5 F (37.5 C) 100.8 F (38.2 C) 100.6 F (38.1 C) 99.7 F (37.6 C)  TempSrc: Axillary Oral Oral Oral  Resp:  22 21   Height:      Weight:  177 lb (80.287 kg)    SpO2:  97% 95%     Intake/Output Summary (Last 24 hours) at 06/05/12 1924 Last data filed at 06/05/12 1700  Gross per 24 hour  Intake 1732.83 ml  Output   2850 ml  Net -1117.17 ml    Exam:   General:  Pt is alert, follows commands appropriately, not in acute distress; non verbal but does understand questions   Cardiovascular: Regular rate and rhythm, S1/S2, no murmurs, no rubs, no gallops  Respiratory: Clear to auscultation bilaterally, no wheezing, no crackles, no rhonchi  Abdomen: Soft, non tender, non distended, bowel sounds present, no guarding  Extremities: No edema, pulses DP and PT palpable bilaterally  Neuro: Grossly nonfocal  Data  Reviewed: Basic Metabolic Panel:  Lab 06/05/12 2952 06/04/12 1837 06/04/12 1335  NA 139 140 137  K 3.8 3.5 3.4*  CL 109 106 101  CO2 21 24 25   GLUCOSE 122* 118* 122*  BUN 11 12 14   CREATININE 0.63 0.71 0.70  CALCIUM 8.5 8.5 9.6  MG -- 1.8 --  PHOS -- 2.8 --   Liver Function Tests:  Lab 06/05/12 0450 06/04/12 1837 06/04/12 1335  AST 30 28 22   ALT 18 24 20   ALKPHOS 68 66 70  BILITOT 0.4 0.3 0.4  PROT 6.5 6.7 7.7  ALBUMIN 2.7* 2.9* 3.5   No results found for this basename: LIPASE:5,AMYLASE:5 in the last 168 hours No results found for this basename: AMMONIA:5 in the last 168 hours CBC:  Lab 06/05/12 0450 06/04/12 1837 06/04/12 1335  WBC 8.6 9.4 8.8  NEUTROABS -- 6.9 7.1  HGB 11.3* 11.6* 13.3  HCT 34.6* 34.7* 39.4  MCV 94.8 94.0 92.9  PLT 203 232 264   Cardiac Enzymes: No results found for this basename: CKTOTAL:5,CKMB:5,CKMBINDEX:5,TROPONINI:5 in the last 168 hours BNP: No components found with this basename: POCBNP:5 CBG:  Lab 06/05/12 0733 06/04/12 1317  GLUCAP 129* 120*    Recent Results (from the past 240 hour(s))  CULTURE, BLOOD (ROUTINE X 2)     Status: Normal (Preliminary result)   Collection Time   06/04/12  1:35 PM      Component Value Range Status Comment   Specimen Description  BLOOD RIGHT AC   Final    Special Requests BOTTLES DRAWN AEROBIC AND ANAEROBIC 5 CC EACH   Final    Culture  Setup Time 06/04/2012 19:10   Final    Culture     Final    Value:        BLOOD CULTURE RECEIVED NO GROWTH TO DATE CULTURE WILL BE HELD FOR 5 DAYS BEFORE ISSUING A FINAL NEGATIVE REPORT   Report Status PENDING   Incomplete   URINE CULTURE     Status: Normal (Preliminary result)   Collection Time   06/04/12  2:23 PM      Component Value Range Status Comment   Specimen Description URINE, CLEAN CATCH   Final    Special Requests NONE   Final    Culture  Setup Time 06/04/2012 19:09   Final    Colony Count PENDING   Incomplete    Culture Culture reincubated for better  growth   Final    Report Status PENDING   Incomplete   CULTURE, BLOOD (ROUTINE X 2)     Status: Normal (Preliminary result)   Collection Time   06/04/12  2:39 PM      Component Value Range Status Comment   Specimen Description BLOOD RIGHT HAND   Final    Special Requests BOTTLES DRAWN AEROBIC ONLY 2 CC   Final    Culture  Setup Time 06/04/2012 19:10   Final    Culture     Final    Value:        BLOOD CULTURE RECEIVED NO GROWTH TO DATE CULTURE WILL BE HELD FOR 5 DAYS BEFORE ISSUING A FINAL NEGATIVE REPORT   Report Status PENDING   Incomplete      Studies: Dg Chest Port 1 View  06/04/2012  *RADIOLOGY REPORT*  Clinical Data: Fever.  PORTABLE CHEST - 1 VIEW  Comparison: No priors.  Findings: Lung volumes are low.  No consolidative airspace disease. No pleural effusions.  Pulmonary vasculature is normal.  Heart size is normal. The patient is rotated to the left on today's exam, resulting in distortion of the mediastinal contours and reduced diagnostic sensitivity and specificity for mediastinal pathology. Atherosclerotic calcifications within the arch of the aorta.  Soft tissue anchors project over the left humeral head, likely from prior rotator cuff surgery.  IMPRESSION: 1.  No radiographic evidence of acute cardiopulmonary disease. 2.  Atherosclerosis.  Original Report Authenticated By: Florencia Reasons, M.D.    Scheduled Meds:   . carbidopa-levodopa  1 tablet Oral TID  . cefTRIAXone (ROCEPHIN)  IV  2 g Intravenous Q24H  . potassium chloride  10 mEq Intravenous Q1 Hr x 2  . psyllium  1 packet Oral Daily   Continuous Infusions:   . sodium chloride 150 mL/hr at 06/05/12 1853

## 2012-06-05 NOTE — Evaluation (Signed)
Physical Therapy Evaluation Patient Details Name: Meagan [name] MRN: [id number] DOB: [birth year] Today's Date: 06/05/2012 Time: 4098-1191 PT Time Calculation (min): 20 min  PT Assessment / Plan / Recommendation Clinical Impression  77 yo female admitted with unresponsiveness, fever. Pt with hx of Parkinson's disease. Per pt report (no family present, unsure of accuracy) she was alone during the day while daughter worked. She was able to perform ADLs/mobility without assistance. Pt currently requires +2 assist for safe mobility. At this time, do not feel pt is safe to d/c home alone. Recommend CIR consult. If CIR not an option, may need to consider SNF for ST rehab if 24 hour supervison/assist is not available.    PT Assessment  Patient needs continued PT services    Follow Up Recommendations  Inpatient Rehab; 24 hour supervision    Barriers to Discharge Decreased caregiver support      Equipment Recommendations   (to be determined)    Recommendations for Other Services OT consult;Rehab consult   Frequency Min 3X/week    Precautions / Restrictions Precautions Precautions: Fall Restrictions Weight Bearing Restrictions: No   Pertinent Vitals/Pain       Mobility  Bed Mobility Bed Mobility: Supine to Sit Supine to Sit: 3: Mod assist;HOB elevated Details for Bed Mobility Assistance: Assist for bil LEs off bed and trunk to upright. Utilized bedpad for scooting, positioning. Increased time to complete task with difficulty initiating noted.  Transfers Transfers: Sit to Stand;Stand to Sit Sit to Stand: 1: +2 Total assist;From bed;With upper extremity assist Sit to Stand: Patient Percentage: 60% Stand to Sit: To chair/3-in-1;With upper extremity assist Details for Transfer Assistance: VCS safety, technique, hand placement. Assist to rise, stabilize, control descent.  Ambulation/Gait Ambulation/Gait Assistance: 1: +2 Total assist Ambulation/Gait: Patient Percentage:  60% Ambulation Distance (Feet): 80 Feet Assistive device: Rolling walker Ambulation/Gait Assistance Details: VCs safety, posture, distance from RW. Difficulty initiating activity. Festinating pattern intermittenly. Attempted ambulation with RW-pt unable to peform safely.  Gait Pattern: Festinating;Shuffle;Decreased stride length;Trunk flexed;Step-through pattern    Exercises     PT Diagnosis: Difficulty walking;Abnormality of gait;Generalized weakness  PT Problem List: Decreased strength;Decreased activity tolerance;Decreased balance;Decreased mobility;Decreased coordination;Decreased knowledge of use of DME PT Treatment Interventions: DME instruction;Gait training;Functional mobility training;Therapeutic activities;Therapeutic exercise;Balance training;Patient/family education   PT Goals Acute Rehab PT Goals PT Goal Formulation: With patient Time For Goal Achievement: 06/19/12 Potential to Achieve Goals: Good Pt will go Supine/Side to Sit: with supervision PT Goal: Supine/Side to Sit - Progress: Goal set today Pt will go Sit to Supine/Side: with supervision PT Goal: Sit to Supine/Side - Progress: Goal set today Pt will go Sit to Stand: with supervision PT Goal: Sit to Stand - Progress: Goal set today Pt will Ambulate: 51 - 150 feet;with supervision;with least restrictive assistive device PT Goal: Ambulate - Progress: Goal set today  Visit Information  Last PT Received On: 06/05/12 Assistance Needed: +2    Subjective Data  Subjective: "Can I have a tissue for my nose"   Patient Stated Goal: None   Prior Functioning  Home Living Lives With: Daughter Available Help at Discharge: Family Type of Home: House Home Access: Level entry Home Layout: One level Home Adaptive Equipment: None Prior Function Level of Independence: Independent Communication Communication: No difficulties Unsure of accuracy of information pt has given    Cognition  Overall Cognitive Status: Appears  within functional limits for tasks assessed/performed  (Slow to process at times, but pt able to give answer for questions) Arousal/Alertness: Awake/alert Orientation  Level: Appears intact for tasks assessed Behavior During Session: [facility] for tasks performed    Extremity/Trunk Assessment Right Upper Extremity Assessment RUE ROM/Strength/Tone: Deficits RUE ROM/Strength/Tone Deficits: Resting tremors noted-increased tremor activity with intention Left Upper Extremity Assessment LUE ROM/Strength/Tone: Deficits LUE ROM/Strength/Tone Deficits: Resting tremors noted-increased tremor activity with intention Right Lower Extremity Assessment RLE ROM/Strength/Tone: Deficits RLE ROM/Strength/Tone Deficits: Strength at least 4/5 with functional activity.  Left Lower Extremity Assessment LLE ROM/Strength/Tone: Deficits LLE ROM/Strength/Tone Deficits: Strength at least 4/5 with functional activity.  Trunk Assessment Trunk Assessment: Normal   Balance Balance Balance Assessed: Yes Static Sitting Balance Static Sitting - Balance Support: Bilateral upper extremity supported;Feet supported Static Sitting - Level of Assistance: 4: Min assist Static Sitting - Comment/# of Minutes: Assist to shift and maintain weight anteriorly over BOS.  Dynamic Standing Balance Dynamic Standing - Balance Support: No upper extremity supported Dynamic Standing - Level of Assistance: 3: Mod assist  End of Session PT - End of Session Equipment Utilized During Treatment: Gait belt Activity Tolerance: Patient tolerated treatment well Patient left: in chair;with call bell/phone within reach  GP     Rebeca Alert [facility] 06/05/2012, 10:20 AM [phone]

## 2012-06-06 LAB — GLUCOSE, CAPILLARY: Glucose-Capillary: 108 mg/dL — ABNORMAL HIGH (ref 70–99)

## 2012-06-06 MED ORDER — CIPROFLOXACIN HCL 500 MG PO TABS: 500.0000 mg | ORAL_TABLET | Freq: Two times a day (BID) | ORAL | Status: DC

## 2012-06-06 NOTE — Discharge Summary (Signed)
Physician Discharge Summary  Meagan [name] [id number] DOB: August 24, 1935 DOA: 06/04/2012  PCP: Dorrene German, MD  Admit date: 06/04/2012 Discharge date: 06/06/2012  Discharge Condition: medically stable for discharge; patient and her daughter have insisted on discharge home today and not to proceed with inpatient rehab evaluation  Diet recommendation: regular as tolerated  History of present illness:  77 year old female with Parkinson's disease who presented to ED after being found less responsive at home and has had fever. No chest pain, no shortness of breath, no cough. No abdominal pain, no nausea or vomiting, no hematuria or blood in stool.   Assessment/Plan   Principal Problem:  *Fever  - continue cipro 500 mg daily for 5 days - follow up blood culture results - no growth to date  - ceftriaxone started in ED   Active Problems:  Parkinson disease  - continue carbidopa   Hypokalemia  - resolved   Code Status: full code  Family Communication: none at bedside  Disposition Plan: will follow up with PT evaluation   Discharge Exam: Filed Vitals:   06/06/12 0630  BP: 146/73  Pulse: 71  Temp: 98.7 F (37.1 C)  Resp: 20   Filed Vitals:   06/05/12 1429 06/05/12 1847 06/05/12 2200 06/06/12 0630  BP: 139/75  147/71 146/73  Pulse: 83  79 71  Temp: 100.6 F (38.1 C) 99.7 F (37.6 C) 99.2 F (37.3 C) 98.7 F (37.1 C)  TempSrc: Oral Oral Oral Oral  Resp: 21  20 20   Height:      Weight:    176 lb 3.2 oz (79.924 kg)  SpO2: 95%  99% 97%    General: Pt is alert, follows commands appropriately, not in acute distress Cardiovascular: Regular rate and rhythm, S1/S2 +, no murmurs, no rubs, no gallops Respiratory: Clear to auscultation bilaterally, no wheezing, no crackles, no rhonchi Abdominal: Soft, non tender, non distended, bowel sounds +, no guarding Extremities: no edema, no cyanosis, pulses palpable bilaterally DP and PT Neuro: Grossly nonfocal  Discharge  Instructions  Discharge Orders    Future Appointments: Provider: Department: Dept Phone: Center:   07/27/2012 3:00 PM Milas Gain, MD [facility] [phone] None     Future Orders Please Complete By Expires   Diet - low sodium heart healthy      Increase activity slowly      Call MD for:  persistant nausea and vomiting      Call MD for:  severe uncontrolled pain      Call MD for:  difficulty breathing, headache or visual disturbances      Call MD for:  persistant dizziness or light-headedness        Medication List  As of 06/06/2012 10:56 AM   TAKE these medications         carbidopa-levodopa 25-100 MG per tablet   Commonly known as: SINEMET IR   Take 1 tablet by mouth 3 (three) times daily.      ciprofloxacin 500 MG tablet   Commonly known as: CIPRO   Take 1 tablet (500 mg total) by mouth 2 (two) times daily.      EX-LAX PO   Take by mouth as needed.      psyllium 58.6 % packet   Commonly known as: METAMUCIL   Take 1 packet by mouth daily.           Follow-up Information    Follow up with Fleet Contras A, MD. Schedule an appointment as soon as possible for  a visit in 1 week.   Contact information:   [address] [place] [address] [phone]          The results of significant diagnostics from this hospitalization (including imaging, microbiology, ancillary and laboratory) are listed below for reference.    Significant Diagnostic Studies: Dg Chest Port 1 View 06/04/2012  *RADIOLOGY REPORT*  Clinical Data: Fever.  PORTABLE CHEST - 1 VIEW  Comparison: No priors.  Findings: Lung volumes are low.  No consolidative airspace disease. No pleural effusions.  Pulmonary vasculature is normal.  Heart size is normal. The patient is rotated to the left on today's exam, resulting in distortion of the mediastinal contours and reduced diagnostic sensitivity and specificity for mediastinal pathology. Atherosclerotic calcifications within the arch of  the aorta.  Soft tissue anchors project over the left humeral head, likely from prior rotator cuff surgery.  IMPRESSION: 1.  No radiographic evidence of acute cardiopulmonary disease. 2.  Atherosclerosis.  Original Report Authenticated By: Florencia Reasons, M.D.    Microbiology: Recent Results (from the past 240 hour(s))  CULTURE, BLOOD (ROUTINE X 2)     Status: Normal (Preliminary result)   Collection Time   06/04/12  1:35 PM      Component Value Range Status Comment   Specimen Description BLOOD RIGHT AC   Final    Special Requests BOTTLES DRAWN AEROBIC AND ANAEROBIC 5 CC EACH   Final    Culture  Setup Time 06/04/2012 19:10   Final    Culture     Final    Value:        BLOOD CULTURE RECEIVED NO GROWTH TO DATE CULTURE WILL BE HELD FOR 5 DAYS BEFORE ISSUING A FINAL NEGATIVE REPORT   Report Status PENDING   Incomplete   URINE CULTURE     Status: Normal (Preliminary result)   Collection Time   06/04/12  2:23 PM      Component Value Range Status Comment   Specimen Description URINE, CLEAN CATCH   Final    Special Requests NONE   Final    Culture  Setup Time 06/04/2012 19:09   Final    Colony Count PENDING   Incomplete    Culture Culture reincubated for better growth   Final    Report Status PENDING   Incomplete   CULTURE, BLOOD (ROUTINE X 2)     Status: Normal (Preliminary result)   Collection Time   06/04/12  2:39 PM      Component Value Range Status Comment   Specimen Description BLOOD RIGHT HAND   Final    Special Requests BOTTLES DRAWN AEROBIC ONLY 2 CC   Final    Culture  Setup Time 06/04/2012 19:10   Final    Culture     Final    Value:        BLOOD CULTURE RECEIVED NO GROWTH TO DATE CULTURE WILL BE HELD FOR 5 DAYS BEFORE ISSUING A FINAL NEGATIVE REPORT   Report Status PENDING   Incomplete      Labs: Basic Metabolic Panel:  Lab 06/05/12 7846 06/04/12 1837 06/04/12 1335  NA 139 140 137  K 3.8 3.5 3.4*  CL 109 106 101  CO2 21 24 25   GLUCOSE 122* 118* 122*  BUN 11 12 14     CREATININE 0.63 0.71 0.70  CALCIUM 8.5 8.5 9.6  MG -- 1.8 --  PHOS -- 2.8 --   Liver Function Tests:  Lab 06/05/12 0450 06/04/12 1837 06/04/12 1335  AST 30 28 22   ALT 18 24 20   ALKPHOS 68 66 70  BILITOT 0.4 0.3 0.4  PROT 6.5 6.7 7.7  ALBUMIN 2.7* 2.9* 3.5   CBC:  Lab 06/05/12 0450 06/04/12 1837 06/04/12 1335  WBC 8.6 9.4 8.8  HGB 11.3* 11.6* 13.3  HCT 34.6* 34.7* 39.4  MCV 94.8 94.0 92.9  PLT 203 232 264   CBG:  Lab 06/05/12 2218 06/05/12 0733 06/04/12 1317  GLUCAP 108* 129* 120*    Time coordinating discharge: Over 30 minutes  Signed:  Manson Passey, MD  [facility] 06/06/2012, 10:56 AM  Pager #: [phone]

## 2012-06-06 NOTE — Progress Notes (Signed)
Physical Therapy Treatment Patient Details Name: Meagan [name] MRN: [id number] DOB: November 28, 1935 Today's Date: 06/06/2012 Time: 7829-5621 PT Time Calculation (min): 37 min  PT Assessment / Plan / Recommendation Comments on Treatment Session  Improved performance with mobility this session. Continues to require Min-Mod A. Recommend CIR, if possible, to maximize independence and safety with mobility. If pt discharges home, recommend 24 hour supervision and HHPT. May need to consider SNF if CIR not an option and 24 hour not available.     Follow Up Recommendations  Inpatient Rehab    Barriers to Discharge        Equipment Recommendations  Rolling walker with 5" wheels    Recommendations for Other Services OT consult  Frequency Min 3X/week   Plan Discharge plan remains appropriate    Precautions / Restrictions Precautions Precautions: Fall Restrictions Weight Bearing Restrictions: No   Pertinent Vitals/Pain     Mobility  Bed Mobility Bed Mobility: Supine to Sit Supine to Sit: HOB elevated;3: Mod assist;With rails Details for Bed Mobility Assistance: Assist for trunk to upright and Min A for bil Les off bed. Increased time to initiate and complete task.  Transfers Transfers: Stand to Sit;Sit to Stand Sit to Stand: 3: Mod assist;From elevated surface;With upper extremity assist;From bed Stand to Sit: To chair/3-in-1;4: Min assist;With armrests;With upper extremity assist Details for Transfer Assistance: VCs safety, technique, hand placement. Assist to rise, stabilize, control descent. Stood at [facility] with RW while therapist performed a little hygiene. Total A for hygiene, but only Min A for stability.  Ambulation/Gait Ambulation/Gait Assistance: 4: Min assist Ambulation Distance (Feet): 80 Feet Assistive device: Rolling walker Ambulation/Gait Assistance Details: VCs safety, posture, distance from RW, to increase step length. Intermittent shuffling noted. Improved step length  compared to eval. Slow gait speed. Assist to stabilize and navigate with RW.  Gait Pattern: Step-through pattern;Shuffle;Trunk flexed;Decreased stride length;Decreased step length - right;Decreased step length - left    Exercises     PT Diagnosis:    PT Problem List:   PT Treatment Interventions:     PT Goals Acute Rehab PT Goals PT Goal: Supine/Side to Sit - Progress: Progressing toward goal PT Goal: Sit to Stand - Progress: Progressing toward goal PT Goal: Ambulate - Progress: Progressing toward goal  Visit Information  Last PT Received On: 06/06/12 Assistance Needed: +1    Subjective Data  Subjective: "I feel good today. I'm glad you came." Patient Stated Goal: Home   Cognition  Overall Cognitive Status: Appears within functional limits for tasks assessed/performed Arousal/Alertness: Awake/alert Orientation Level: Appears intact for tasks assessed Behavior During Session: [facility] for tasks performed    Balance     End of Session PT - End of Session Equipment Utilized During Treatment: Gait belt Activity Tolerance: Patient tolerated treatment well Patient left: in chair;with call bell/phone within reach   GP     Rebeca Alert [facility] 06/06/2012, 10:37 AM [phone]

## 2012-06-06 NOTE — Progress Notes (Signed)
Foley d/c'd. Will monitor for voiding

## 2012-06-06 NOTE — Consult Note (Signed)
Physical Medicine and Rehabilitation Consult Reason for Consult: Weakness Referring Physician: Dr. Elisabeth Pigeon   HPI: Meagan [name] is a 77 y.o. female with history of Parkinson's disease who was admitted via ED on 06/04/12 due to acute onset unresponsives at home. Noted to ber febrile with temp of 104.  Started on IV ceftriaxone for presumptive UTI with improvement in mentation. PT evaluation done 07/01 and patient was noted to have delayed processing and generalized weakness.  MD, PT recommending CIR. Foley D/Cd today, voiding with some inc   Review of Systems  HENT: Negative for hearing loss.   Eyes: Positive for photophobia. Negative for blurred vision.  Respiratory: Negative for cough, sputum production and shortness of breath.   Cardiovascular: Negative for chest pain and palpitations.  Gastrointestinal: Negative for nausea and abdominal pain.  Genitourinary: Negative for urgency and frequency.  Neurological: Positive for tremors. Negative for headaches.       Parkinson's disease dx last fall.  Psychiatric/Behavioral: The patient does not have insomnia.    Past Medical History  Diagnosis Date  . Hypertension   . Tremor   . History of gallstones    Past Surgical History  Procedure Date  . Shoulder surgery   . Knee surgery   . Colonoscopy    Family History  Problem Relation Age of Onset  . Diabetes Father   . Colon cancer Neg Hx    Social History: Lives with daughter.  Retired Research scientist (physical sciences). She  reports that she has never smoked. She has never used smokeless tobacco. She reports that she does not drink alcohol or use illicit drugs. Daughter works days.  No falls reported by pt at home RN notes no safety awareness issues   Allergies: No Known Allergies  Medications Prior to Admission  Medication Sig Dispense Refill  . carbidopa-levodopa (SINEMET) 25-100 MG per tablet Take 1 tablet by mouth 3 (three) times daily.  90 tablet  6  . psyllium (METAMUCIL) 58.6 % packet  Take 1 packet by mouth daily.        . Sennosides (EX-LAX PO) Take by mouth as needed.          Home: Home Living Lives With: Daughter Available Help at Discharge: Family Type of Home: House Home Access: Level entry Home Layout: One level Home Adaptive Equipment: None  Functional History:   Functional Status:  Mobility: Bed Mobility Bed Mobility: Supine to Sit Supine to Sit: 3: Mod assist;HOB elevated Transfers Transfers: Sit to Stand;Stand to Sit Sit to Stand: 1: +2 Total assist;From bed;With upper extremity assist Sit to Stand: Patient Percentage: 60% Stand to Sit: To chair/3-in-1;With upper extremity assist Ambulation/Gait Ambulation/Gait Assistance: 1: +2 Total assist Ambulation/Gait: Patient Percentage: 60% Ambulation Distance (Feet): 80 Feet Assistive device: Rolling walker Ambulation/Gait Assistance Details: VCs safety, posture, distance from RW. Difficulty initiating activity. Festinating pattern intermittenly. Attempted ambulation with RW-pt unable to peform safely.  Gait Pattern: Festinating;Shuffle;Decreased stride length;Trunk flexed;Step-through pattern    ADL:    Cognition: Cognition Arousal/Alertness: Awake/alert Orientation Level: Oriented to person;Disoriented to time;Disoriented to situation (stated shes in the hospital) Cognition Overall Cognitive Status: Appears within functional limits for tasks assessed/performed Arousal/Alertness: Awake/alert Orientation Level: Appears intact for tasks assessed Behavior During Session: [facility] for tasks performed  Blood pressure 146/73, pulse 71, temperature 98.7 F (37.1 C), temperature source Oral, resp. rate 20, height 5\' 10"  (1.778 m), weight 79.924 kg (176 lb 3.2 oz), SpO2 97.00%.  Physical Exam  Nursing note and vitals reviewed. Constitutional: She appears well-developed and  well-nourished.  HENT:  Head: Normocephalic and atraumatic.  Eyes: Pupils are equal, round, and reactive to light.  Cardiovascular:  Normal rate and regular rhythm.   Pulmonary/Chest: Effort normal and breath sounds normal.  Abdominal: Soft. Bowel sounds are normal.  Musculoskeletal: She exhibits no edema.  Neurological: She is alert. Coordination abnormal.       Intentional>resting tremors.  Cogwheeling BLE. Decreased memory. Follows basic commands without difficulty.  Skin: Skin is warm and dry.  Motor strength 5/5 in BUE and BLE Sensation normal in UE and LEs +cogwheel rigidity in Bilateral Biceps DTRs nl Results for orders placed during the hospital encounter of 06/04/12 (from the past 24 hour(s))  GLUCOSE, CAPILLARY     Status: Abnormal   Collection Time   06/05/12 10:18 PM      Component Value Range   Glucose-Capillary 108 (*) 70 - 99 mg/dL   Dg Chest Port 1 View  06/04/2012  *RADIOLOGY REPORT*  Clinical Data: Fever.  PORTABLE CHEST - 1 VIEW  Comparison: No priors.  Findings: Lung volumes are low.  No consolidative airspace disease. No pleural effusions.  Pulmonary vasculature is normal.  Heart size is normal. The patient is rotated to the left on today's exam, resulting in distortion of the mediastinal contours and reduced diagnostic sensitivity and specificity for mediastinal pathology. Atherosclerotic calcifications within the arch of the aorta.  Soft tissue anchors project over the left humeral head, likely from prior rotator cuff surgery.  IMPRESSION: 1.  No radiographic evidence of acute cardiopulmonary disease. 2.  Atherosclerosis.  Original Report Authenticated By: Florencia Reasons, M.D.    Assessment/Plan: Diagnosis: Parkinson's disease recent onset with pseudoexacerbation due to UTI 1. Does the need for close, 24 hr/day medical supervision in concert with the patient's rehab needs make it unreasonable for this patient to be served in a less intensive setting? Yes 2. Co-Morbidities requiring supervision/potential complications: Urinary incont, UTI 3. Due to bladder management, bowel management, skin/wound  care, disease management, medication administration and patient education, does the patient require 24 hr/day rehab nursing? Yes 4. Does the patient require coordinated care of a physician, rehab nurse, PT (1-2 hrs/day, 5 days/week), OT (1-2 hrs/day, 5 days/week) and SLP (.5-1 hrs/day, 5 days/week) to address physical and functional deficits in the context of the above medical diagnosis(es)? Yes Addressing deficits in the following areas: balance, endurance, locomotion, strength, transferring, bowel/bladder control, bathing, dressing, toileting and cognition 5. Can the patient actively participate in an intensive therapy program of at least 3 hrs of therapy per day at least 5 days per week? Yes 6. The potential for patient to make measurable gains while on inpatient rehab is good 7. Anticipated functional outcomes upon discharge from inpatient rehab are Mod I/S mobility with PT, Mod I/Sup ADLs with OT, eval cognition and swallow with SLP. 8. Estimated rehab length of stay to reach the above functional goals is: 10d 9. Does the patient have adequate social supports to accommodate these discharge functional goals? Potentially 10. Anticipated D/C setting: Home 11. Anticipated post D/C treatments: HH therapy 12. Overall Rehab/Functional Prognosis: good  RECOMMENDATIONS: This patient's condition is appropriate for continued rehabilitative care in the following setting: CIR Patient has agreed to participate in recommended program. Yes Note that insurance prior authorization may be required for reimbursement for recommended care.  Comment:    06/06/2012

## 2012-06-06 NOTE — Progress Notes (Signed)
Contacted via phone by [name]- pts daughter and caregiver who is concerned that no physician has reached out to her regarding her mother's status.  Daughter states staff have been taking good care of her mother, but she would like to her from a physician, for her mother has difficulty communicating and remembering what the physician has told her.  Daughter also voiced concern about a discharge date and possibly wanting home health.  I apologized to the daughter regarding the lack of communication and ensured the daughter that a physician has been rounding on her mother and provided her with the date and times of H&P and progress notes from Dr Elisabeth Pigeon regarding her mothers care.  I assured the daughter that I would reach out to the physician regarding her concerns and provide the physician with her number.  The daughter verbalized appreciation and I told her to f/u with me if needs were not met.  Dr Elisabeth Pigeon was then paged with pt name, daughter name/number and concerns for her to call her.

## 2012-06-06 NOTE — Discharge Instructions (Signed)

## 2012-06-06 NOTE — Progress Notes (Signed)
Addendum: Daughter of MRrs. Benedetti changed her mind and wanted her mother to be evaluated for inpatient rehab. At this time we are awaiting bed awailability  Manson Passey [facility] Pager: [phone]

## 2012-06-06 NOTE — Progress Notes (Signed)
CARE MANAGEMENT NOTE 06/06/2012  Patient:  [facility]   Account Number:  [id number]  Date Initiated:  06/06/2012  Documentation initiated by:  DAVIS,RHONDA  Subjective/Objective Assessment:   patient with change in mental status , fever     Action/Plan:   has been living with daughter plan is for ip rehab   Anticipated DC Date:  06/06/2012   Anticipated DC Plan:  IP REHAB FACILITY  In-house referral  NA      DC Planning Services  CM consult      PAC Choice  NA   Choice offered to / List presented to:  NA   DME arranged  NA      DME agency  NA     HH arranged  NA      HH agency  NA   Status of service:  In process, will continue to follow Medicare Important Message given?   (If response is "NO", the following Medicare IM given date fields will be blank) Date Medicare IM given:   Date Additional Medicare IM given:    Discharge Disposition:    Per UR Regulation:  Reviewed for med. necessity/level of care/duration of stay  If discussed at Long Length of Stay Meetings, dates discussed:    Comments:  [id number]/Rhonda Earlene Plater, RN, BSN, CCM Chart and patient information reviewed no discharge needs present at this time. Case Management [phone]

## 2012-06-07 ENCOUNTER — Encounter (HOSPITAL_COMMUNITY): Payer: Self-pay | Admitting: *Deleted

## 2012-06-07 ENCOUNTER — Inpatient Hospital Stay (HOSPITAL_COMMUNITY)
Admission: RE | Admit: 2012-06-07 | Discharge: 2012-06-16 | DRG: 945 | Disposition: A | Discharge status: Home Health | Payer: No Typology Code available for payment source | Source: Ambulatory Visit | Attending: Physical Medicine & Rehabilitation | Admitting: Physical Medicine & Rehabilitation

## 2012-06-07 DIAGNOSIS — G20 Parkinson's disease: Secondary | ICD-10-CM | POA: Diagnosis present

## 2012-06-07 DIAGNOSIS — E876 Hypokalemia: Secondary | ICD-10-CM | POA: Diagnosis not present

## 2012-06-07 DIAGNOSIS — I10 Essential (primary) hypertension: Secondary | ICD-10-CM | POA: Diagnosis present

## 2012-06-07 DIAGNOSIS — K5900 Constipation, unspecified: Secondary | ICD-10-CM | POA: Diagnosis present

## 2012-06-07 DIAGNOSIS — D649 Anemia, unspecified: Secondary | ICD-10-CM | POA: Diagnosis present

## 2012-06-07 DIAGNOSIS — A498 Other bacterial infections of unspecified site: Secondary | ICD-10-CM | POA: Diagnosis present

## 2012-06-07 DIAGNOSIS — R5381 Other malaise: Secondary | ICD-10-CM

## 2012-06-07 DIAGNOSIS — A499 Bacterial infection, unspecified: Secondary | ICD-10-CM | POA: Diagnosis present

## 2012-06-07 DIAGNOSIS — Z5189 Encounter for other specified aftercare: Secondary | ICD-10-CM | POA: Diagnosis present

## 2012-06-07 DIAGNOSIS — N390 Urinary tract infection, site not specified: Secondary | ICD-10-CM | POA: Diagnosis present

## 2012-06-07 DIAGNOSIS — G20A1 Parkinson's disease without dyskinesia, without mention of fluctuations: Secondary | ICD-10-CM | POA: Diagnosis present

## 2012-06-07 LAB — URINE CULTURE: Colony Count: 95000

## 2012-06-07 LAB — GLUCOSE, CAPILLARY: Glucose-Capillary: 91 mg/dL (ref 70–99)

## 2012-06-07 MED ORDER — FLEET ENEMA 7-19 GM/118ML RE ENEM
1.0000 | ENEMA | Freq: Once | RECTAL | Status: AC | PRN
  Filled 2012-06-07: qty 1

## 2012-06-07 MED ORDER — PROCHLORPERAZINE MALEATE 5 MG PO TABS: 5.0000 mg | ORAL_TABLET | Freq: Four times a day (QID) | ORAL | Status: DC | PRN

## 2012-06-07 MED ORDER — BISACODYL 10 MG RE SUPP: 10.0000 mg | Freq: Every day | RECTAL | Status: DC | PRN

## 2012-06-07 MED ORDER — LISINOPRIL 2.5 MG PO TABS
2.5000 mg | ORAL_TABLET | Freq: Every day | ORAL | Status: DC
  Administered 2012-06-07 – 2012-06-12 (×6): 2.5 mg via ORAL
  Filled 2012-06-07 (×9): qty 1

## 2012-06-07 MED ORDER — PROCHLORPERAZINE EDISYLATE 5 MG/ML IJ SOLN: 5.0000 mg | Freq: Four times a day (QID) | INTRAMUSCULAR | Status: DC | PRN

## 2012-06-07 MED ORDER — PSYLLIUM 95 % PO PACK
1.0000 | PACK | Freq: Every day | ORAL | Status: DC
  Administered 2012-06-08 – 2012-06-16 (×8): 1 via ORAL
  Filled 2012-06-07 (×11): qty 1

## 2012-06-07 MED ORDER — ALUM & MAG HYDROXIDE-SIMETH 200-200-20 MG/5ML PO SUSP: 30.0000 mL | ORAL | Status: DC | PRN

## 2012-06-07 MED ORDER — ACETAMINOPHEN 325 MG PO TABS
325.0000 mg | ORAL_TABLET | ORAL | Status: DC | PRN
  Filled 2012-06-07: qty 2

## 2012-06-07 MED ORDER — ENOXAPARIN SODIUM 40 MG/0.4ML ~~LOC~~ SOLN
40.0000 mg | SUBCUTANEOUS | Status: DC
  Administered 2012-06-07 – 2012-06-11 (×3): 40 mg via SUBCUTANEOUS
  Filled 2012-06-07 (×6): qty 0.4

## 2012-06-07 MED ORDER — PROCHLORPERAZINE 25 MG RE SUPP: 12.5000 mg | Freq: Four times a day (QID) | RECTAL | Status: DC | PRN

## 2012-06-07 MED ORDER — TRAZODONE HCL 50 MG PO TABS
25.0000 mg | ORAL_TABLET | Freq: Every evening | ORAL | Status: DC | PRN
  Filled 2012-06-07: qty 1

## 2012-06-07 MED ORDER — CARBIDOPA-LEVODOPA 25-100 MG PO TABS
1.0000 | ORAL_TABLET | Freq: Three times a day (TID) | ORAL | Status: DC
  Administered 2012-06-07 – 2012-06-16 (×28): 1 via ORAL
  Filled 2012-06-07 (×36): qty 1

## 2012-06-07 MED ORDER — CIPROFLOXACIN HCL 500 MG PO TABS
500.0000 mg | ORAL_TABLET | Freq: Two times a day (BID) | ORAL | Status: AC
  Administered 2012-06-07 – 2012-06-10 (×7): 500 mg via ORAL
  Filled 2012-06-07 (×8): qty 1

## 2012-06-07 MED ORDER — GUAIFENESIN-DM 100-10 MG/5ML PO SYRP: 5.0000 mL | ORAL_SOLUTION | Freq: Four times a day (QID) | ORAL | Status: DC | PRN

## 2012-06-07 MED ORDER — CIPROFLOXACIN HCL 500 MG PO TABS: 500.0000 mg | ORAL_TABLET | Freq: Two times a day (BID) | ORAL | Status: DC

## 2012-06-07 NOTE — PMR Pre-admission (Signed)
PMR Admission Coordinator Pre-Admission Assessment  Patient: Meagan [name] is an 77 y.o., female MRN: [id number] DOB: 1935/11/16 Height: 5\' 10"  (177.8 cm) Weight: 85 kg (187 lb 6.3 oz)  Insurance Information HMO:      PPO:       PCP:       IPA:       80/20:       OTHER: Group # [id number] PRIMARY: [employer]      Policy#: [id number]      Subscriber: [name] CM Name:        Phone#:       Fax#: [phone] Pre-Cert#:        Employer: Retired Benefits:  Phone #: [phone]     Name: Schuyler Amor. Date: 12/07/11     Deduct: $0      Out of Pocket Max: [phone] (met $140)      Life Max: unlimited CIR: $235 days 1-6      SNF: $0 1-20 d, $135 days 21-100 Outpatient: Combined visits     Co-Pay: $5/visit Home Health: 100%      Co-Pay: none DME: 80%     Co-Pay: 20% Providers: in network  SECONDARY: [employer] BCBS PPO      Policy#: [id number]      Subscriber: [name] CM Name:        Phone#:       Fax#:   Pre-Cert#:        Employer: Retired Benefits:  Phone #: [phone]     Name:   Eff. Date:       Deduct:        Out of Pocket Max:        Life Max:   CIR:        SNF:   Outpatient:       Co-Pay:   Home Health:        Co-Pay:   DME:       Co-Pay:    Emergency Contact Information Contact Information    Name Relation Home Work Mobile   Malone,[name] Daughter [phone]  [phone]     Current Medical History  Patient Admitting Diagnosis:  Parkinsons with pseudoexacerbation  History of Present Illness: A 77 y.o. female with history of Parkinson's disease who was admitted via ED on 06/04/12 due to acute onset unresponsives at home. Noted to ber febrile with temp of 104. Started on IV ceftriaxone for presumptive UTI with improvement in mentation. PT evaluation done 07/01 and patient was noted to have delayed processing and generalized weakness.  Foley D/Cd 07/02, voiding with some incontinence.  Past Medical History  Past Medical History  Diagnosis Date  .  Hypertension   . Tremor   . History of gallstones    Family History  family history includes Diabetes in her father.  There is no history of Colon cancer.  Prior Rehab/Hospitalizations: No previous rehab admissions.  Went to an exercise class last year.   Current Medications  Current facility-administered medications:0.9 %  sodium chloride infusion, , Intravenous, Continuous, Doug Sou, MD, Last Rate: 150 mL/hr at 06/07/12 0717;  acetaminophen (TYLENOL) suppository 650 mg, 650 mg, Rectal, Q6H PRN, Alison Murray, MD;  acetaminophen (TYLENOL) tablet 650 mg, 650 mg, Oral, Q6H PRN, Alison Murray, MD, 650 mg at 06/04/12 2317 carbidopa-levodopa (SINEMET IR) 25-100 MG per tablet immediate release 1 tablet, 1 tablet, Oral, TID, Alison Murray, MD, 1 tablet at 06/06/12 2106;  cefTRIAXone (ROCEPHIN) 2 g in dextrose 5 %  50 mL IVPB, 2 g, Intravenous, Q24H, Alison Murray, MD, 2 g at 06/06/12 1515;  psyllium (HYDROCIL/METAMUCIL) packet 1 packet, 1 packet, Oral, Daily, Alison Murray, MD, 1 packet at 06/06/12 1031  Patients Current Diet: General  Precautions / Restrictions Precautions Precautions: Fall Restrictions Weight Bearing Restrictions: No   Prior Activity Level Limited Community (1-2x/wk): Went out about 3 X a week with her daughter.  Home Assistive Devices / Equipment Home Assistive Devices/Equipment: Dentures (specify type);Eyeglasses Home Adaptive Equipment: None  Prior Functional Level Prior Function Level of Independence: Independent Driving: No (Not driving for the past 9 months.)  Current Functional Level Cognition  Arousal/Alertness: Awake/alert Overall Cognitive Status: Appears within functional limits for tasks assessed/performed Orientation Level: Oriented to person;Disoriented to time;Disoriented to situation (stated she was in hospital)    Extremity Assessment (includes Sensation/Coordination)  RUE ROM/Strength/Tone: Deficits RUE ROM/Strength/Tone Deficits: Resting  tremors noted-increased tremor activity with intention  RLE ROM/Strength/Tone: Deficits RLE ROM/Strength/Tone Deficits: Strength at least 4/5 with functional activity.     ADLs       Mobility  Bed Mobility: Supine to Sit Supine to Sit: HOB elevated;3: Mod assist;With rails    Transfers  Transfers: Stand to Sit;Sit to Stand Sit to Stand: 3: Mod assist;From elevated surface;With upper extremity assist;From bed Sit to Stand: Patient Percentage: 60% Stand to Sit: To chair/3-in-1;4: Min assist;With armrests;With upper extremity assist    Ambulation / Gait / Stairs / Wheelchair Mobility  Ambulation/Gait Ambulation/Gait Assistance: 4: Min assist Ambulation/Gait: Patient Percentage: 60% Ambulation Distance (Feet): 80 Feet Assistive device: Rolling walker Ambulation/Gait Assistance Details: VCs safety, posture, distance from RW, to increase step length. Intermittent shuffling noted. Improved step length compared to eval. Slow gait speed. Assist to stabilize and navigate with RW.  Gait Pattern: Step-through pattern;Shuffle;Trunk flexed;Decreased stride length;Decreased step length - right;Decreased step length - left    Posture / Balance Static Sitting Balance Static Sitting - Balance Support: Bilateral upper extremity supported;Feet supported Static Sitting - Level of Assistance: 4: Min assist Static Sitting - Comment/# of Minutes: Assist to shift and maintain weight anteriorly over BOS.  Dynamic Standing Balance Dynamic Standing - Balance Support: No upper extremity supported Dynamic Standing - Level of Assistance: 3: Mod assist     Previous Home Environment Living Arrangements: Children (daughter works during day M-F) Lives With: Daughter Available Help at Discharge: Family Type of Home: House Home Layout: One level Home Access: Level entry Home Care Services: No  Discharge Living Setting Plans for Discharge Living Setting: Patient's home;House (Dtr lives with patient.) Type of  Home at Discharge: House Discharge Home Layout: One level Discharge Home Access: Stairs to enter Entrance Stairs-Number of Steps: 1 Do you have any problems obtaining your medications?: No  Social/Family/Support Systems Patient Roles: Parent Contact Information: [name] - Dtr (h) [phone] ([phone] Anticipated Caregiver: Dtr and hired help Ability/Limitations of Caregiver: Dtr works, but can get assist while working for patient. Caregiver Availability: 24/7 Discharge Plan Discussed with Primary Caregiver: Yes Is Caregiver In Agreement with Plan?: Yes Does Caregiver/Family have Issues with Lodging/Transportation while Pt is in Rehab?: No  Goals/Additional Needs Patient/Family Goal for Rehab: PT/OT mod I/S goals, may need ST eval for cognition and swallow. Expected length of stay: 10 days Cultural Considerations: None Dietary Needs: Regular diet Equipment Needs: TBD Pt/Family Agrees to Admission and willing to participate: Yes Program Orientation Provided & Reviewed with Pt/Caregiver Including Roles  & Responsibilities: Yes  Patient Condition: This patient's condition remains as documented in  the Consult dated 06/06/12, in which the Rehabilitation Physician determined and documented that the patient's condition is appropriate for intensive rehabilitative care in an inpatient rehabilitation facility.  Preadmission Screen Completed By:  Trish Mage, 06/07/2012 9:31 AM ______________________________________________________________________   Discussed status with Dr. Riley Kill on 06/07/12 at [phone] and received telephone approval for admission today.  Admission Coordinator:  Trish Mage, time 0931/Date07/03/13

## 2012-06-07 NOTE — Progress Notes (Signed)
Patient transferred from [facility] to [facility] inpatient rehab by care link at 1415.  Admission packet provided and oriented to rehab unit.  Patient unable to sign the safety plan due to tremors in the hands, will pass on to the next nurse for the daughter to sign when daughter comes in.  Call light within reach, bed alarm on.  Patient call appropriately for assistance.

## 2012-06-07 NOTE — Progress Notes (Signed)
[facility].  Report given to nurse. Daughter called with update

## 2012-06-07 NOTE — Progress Notes (Signed)
Rehab admissions - Evaluated for possible admission.  I spoke with patient.  She would like to come to inpatient rehab.  Bed available on inpatient rehab and will plan to admit to inpatient rehab today.  Call me for questions.  #[phone]

## 2012-06-07 NOTE — H&P (Signed)
Physical Medicine and Rehabilitation Admission H&P    Chief Complaint  Patient presents with  . UTI, weakness,  parkinson's disease  : HPI: Meagan [name] is a 77 y.o. female with history of Parkinson's disease who was admitted via ED on 06/04/12 due to acute onset unresponsives at home. Noted to ber febrile with temp of 104. Started on IV ceftriaxone for presumptive UTI with improvement in mentation. Culture positive for E. Coli and proteus. Changed to cipro with recommendations for 7 day course treatment.  PT evaluation done 07/01 and patient was noted to have delayed processing with generalized weakness. Evaluated by therapy team and deemed to be a good candidate for CIR. the patient was ultimately admitted into inpatient rehabilitation.  Review of Systems  HENT: Negative for neck pain.   Eyes: Positive for blurred vision.  Respiratory: Negative for cough and shortness of breath.   Cardiovascular: Negative for chest pain and palpitations.  Gastrointestinal: Positive for constipation.  Genitourinary: Positive for urgency and frequency.  Musculoskeletal: Negative for back pain.  Neurological: Positive for tremors (chronic). Negative for headaches.  All other systems reviewed and are negative.   Past Medical History  Diagnosis Date  . Hypertension   . Tremor   . History of gallstones    Past Surgical History  Procedure Date  . Shoulder surgery   . Knee surgery   . Colonoscopy    Family History  Problem Relation Age of Onset  . Diabetes Father   . Colon cancer Neg Hx    Social History: Retired from [employer].  Lives with daughter and independent PTA. She  reports that she has never smoked. She has never used smokeless tobacco. She reports that she does not drink alcohol or use illicit drugs.  Allergies: No Known Allergies  Medications Prior to Admission  Medication Sig Dispense Refill  . carbidopa-levodopa (SINEMET) 25-100 MG per tablet Take 1 tablet by mouth 3  (three) times daily.  90 tablet  6  . ciprofloxacin (CIPRO) 500 MG tablet Take 1 tablet (500 mg total) by mouth 2 (two) times daily.  5 tablet  0  . psyllium (METAMUCIL) 58.6 % packet Take 1 packet by mouth daily.        . Sennosides (EX-LAX PO) Take by mouth as needed.          Home: Home Living Lives With: Daughter Available Help at Discharge: Family Type of Home: House Home Access: Level entry Home Layout: One level Home Adaptive Equipment: None   Functional History: Prior Function Driving: No (Not driving for the past 9 months.)  Functional Status:  Mobility: Bed Mobility Bed Mobility: Supine to Sit Supine to Sit: HOB elevated;3: Mod assist;With rails Transfers Transfers: Stand to Sit;Sit to Stand Sit to Stand: 3: Mod assist;From elevated surface;With upper extremity assist;From bed Sit to Stand: Patient Percentage: 60% Stand to Sit: To chair/3-in-1;4: Min assist;With armrests;With upper extremity assist Ambulation/Gait Ambulation/Gait Assistance: 4: Min assist Ambulation/Gait: Patient Percentage: 60% Ambulation Distance (Feet): 80 Feet Assistive device: Rolling walker Ambulation/Gait Assistance Details: VCs safety, posture, distance from RW, to increase step length. Intermittent shuffling noted. Improved step length compared to eval. Slow gait speed. Assist to stabilize and navigate with RW.  Gait Pattern: Step-through pattern;Shuffle;Trunk flexed;Decreased stride length;Decreased step length - right;Decreased step length - left    ADL:    Cognition: Cognition Arousal/Alertness: Awake/alert Orientation Level: Oriented to person;Disoriented to time;Disoriented to situation (knows she's in hospital) Cognition Overall Cognitive Status: Appears within functional limits for tasks  assessed/performed Arousal/Alertness: Awake/alert Orientation Level: Appears intact for tasks assessed Behavior During Session: [facility] for tasks performed   Blood pressure 147/56, pulse 60,  temperature 98.4 F (36.9 C), temperature source Oral, resp. rate 18, height 5\' 10"  (1.778 m), weight 85 kg (187 lb 6.3 oz), SpO2 94.00%. Physical Exam  Nursing note and vitals reviewed. Constitutional: She appears well-developed and well-nourished.  HENT:  Head: Normocephalic and atraumatic.  Right Ear: External ear normal.  Nose: Nose normal.  Mouth/Throat: Oropharynx is clear and moist.  Eyes: Conjunctivae and EOM are normal. Pupils are equal, round, and reactive to light.  Neck: Normal range of motion. Neck supple.  Cardiovascular: Normal rate and regular rhythm.   Pulmonary/Chest: Effort normal. No respiratory distress. She has rhonchi.  Abdominal: Soft. Bowel sounds are normal.  Musculoskeletal: She exhibits no edema and no tenderness.  Neurological: She is alert. No cranial nerve deficit.       Oriented to self. Situation and place with cues.  Date "July 5th we celebrate the 4th on the 6th.  Intentional greater than resting tremors in all 4 limbs. Masked facies. No gross rigidity. Strength grossly 3+ proximal to 4/5 distally in all 4 limbs. Follows basic commands without difficulty but needs cues initially for 2 step commands. Slow movements.    Skin: Skin is warm and dry.  Psychiatric: She has a normal mood and affect. She is slowed. Cognition and memory are impaired.    Results for orders placed during the hospital encounter of 06/04/12 (from the past 48 hour(s))  GLUCOSE, CAPILLARY     Status: Abnormal   Collection Time   06/05/12 10:18 PM      Component Value Range Comment   Glucose-Capillary 108 (*) 70 - 99 mg/dL   GLUCOSE, CAPILLARY     Status: Abnormal   Collection Time   06/06/12  7:30 AM      Component Value Range Comment   Glucose-Capillary 103 (*) 70 - 99 mg/dL    Comment 1 Notify RN     GLUCOSE, CAPILLARY     Status: Normal   Collection Time   06/07/12  7:27 AM      Component Value Range Comment   Glucose-Capillary 91  70 - 99 mg/dL    No results found.  Post  Admission Physician Evaluation: 1. Functional deficits secondary  to deconditioning related to urosepsis and pseudo-exacerbation of Parkinson symptoms.. 2. Patient is admitted to receive collaborative, interdisciplinary care between the physiatrist, rehab nursing staff, and therapy team. 3. Patient's level of medical complexity and substantial therapy needs in context of that medical necessity cannot be provided at a lesser intensity of care such as a SNF. 4. Patient has experienced substantial functional loss from his/her baseline which was documented above under the "Functional History" and "Functional Status" headings.  Judging by the patient's diagnosis, physical exam, and functional history, the patient has potential for functional progress which will result in measurable gains while on inpatient rehab.  These gains will be of substantial and practical use upon discharge  in facilitating mobility and self-care at the household level. 5. Physiatrist will provide 24 hour management of medical needs as well as oversight of the therapy plan/treatment and provide guidance as appropriate regarding the interaction of the two. 6. 24 hour rehab nursing will assist with bladder management, bowel management, safety, skin/wound care, disease management, medication administration, pain management and patient education  and help integrate therapy concepts, techniques,education, etc. 7. PT will assess and treat for:  Lower extremity strength, range of motion, neuromuscular reeducation, safety, functional mobility.  Goals are: Supervision to modified in the bed. 8. OT will assess and treat for: Upper extremity strength, range of motion, safety, ADLs, neuromuscular reeducation, adaptive equipment.   Goals are: Modified independent to occasional minimal assistance. 9. SLP will assess and treat for: Potentially for cognitive assessment and treatment. Goals: supervision. 10. Case Management and Social Worker will assess  and treat for psychological issues and discharge planning. 11. Team conference will be held weekly to assess progress toward goals and to determine barriers to discharge. 12. Patient will receive at least 3 hours of therapy per day at least 5 days per week. 13. ELOS: 10 days      Prognosis:  good   Medical Problem List and Plan: 1. DVT Prophylaxis/Anticoagulation: Pharmaceutical: Lovenox 2. Pain Management: N/A 3. Mood:  Monitor for now. Patient appears fairly upbeat on examination today.  4. Neuropsych: This patient is not capable of making decisions on his/her own behalf. ?Mild baseline dementia. 5. Parkinson's disease:  Continue sinemet IR tid 6. E coli/Proteus UTI: on cipro.  Antibiotic D#4/7. Patient remains afebrile. Follow voiding habits. 7. HTN:  New diagnosis.  Start low dose lisinopril and titrate slowly as needed.  Monitor with bid checks.    06/07/2012, Sherrilee Gilles.D.

## 2012-06-07 NOTE — Plan of Care (Signed)
Overall Plan of Care [facility]) Patient Details Name: Meagan [name] MRN: [id number] DOB: May 01, 1935  Diagnosis:  Parkinson pseudoexacerbation  Primary Diagnosis:    <principal problem not specified> Co-morbidities: htn, uti  Functional Problem List  Patient demonstrates impairments in the following areas: Balance, Bladder, Bowel, Cognition, Endurance, Medication Management, Motor and Safety  Basic ADL's: eating, grooming, bathing, dressing and toileting Advanced ADL's: N/A at this time  Transfers:  toilet and tub/shower Locomotion:  ambulation with wheelchair for long distances/safety around house  Additional Impairments:  Communication  comprehension and expression and Social Cognition   social interaction, problem solving, memory, attention and awareness  Anticipated Outcomes Item Anticipated Outcome  Eating/Swallowing  Supervision for self feeding  Basic self-care  Supervision-Min Assist  Tolieting  Supervision  Bowel/Bladder  Min. Assist with bowel/bladder  Transfers  Supervision raised toilet seat with armrests, Min Assist walk in shower stall  Locomotion  Supervision/Min assist  Communication  Min A  Cognition  Min A  Pain  N/A  Safety/Judgment  Min A  Other     Therapy Plan: PT Frequency: 2-3 X/day, 60-90 minutes OT Frequency: 1-2 X/day, 60-90 minutes;5 out of 7 days SLP Frequency: 1-2 X/day, 30-60 minutes   Team Interventions: Item RN PT OT SLP SW TR Other  Self Care/Advanced ADL Retraining   x      Neuromuscular Re-Education  x       Therapeutic Activities  x x x  x   UE/LE Strength Training/ROM  x x   x   UE/LE Coordination Activities  x x   x   Visual/Perceptual Remediation/Compensation         DME/Adaptive Equipment Instruction  x x   x   Therapeutic Exercise  x x   x   Balance/Vestibular Training  x x   x   Patient/Family Education x x x x  x   Cognitive Remediation/Compensation  x  x     Functional Mobility Training  x x   x     Ambulation/Gait Training  x       Museum/gallery curator  x       Wheelchair Propulsion/Positioning  x       Functional Tourist information centre manager Reintegration      x   Dysphagia/Aspiration Landscape architect Facilitation    x     Bladder Management x        Bowel Management x        Disease Management/Prevention         Pain Management         Medication Management x        Skin Care/Wound Management x        Splinting/Orthotics         Discharge Planning  x x x x x   Psychosocial Support  x x x x x                      Team Discharge Planning: Destination:  Home Projected Follow-up:  PT, OT, SLP and Home Health Projected Equipment Needs:  Bedside Commode and shower seat with arm rests, wheelchair + cushion.  Patient/family involved in discharge planning:  Yes  MD ELOS: 10 days Medical Rehab Prognosis:  Excellent Assessment: pt is admitted for CIR therapies. Goals are min assist to supervision. The team will focus on basic self-care, mobility, NMR, safety, communication,  family ed.

## 2012-06-07 NOTE — Progress Notes (Signed)
Pt seem to be very confused, poor attention span, daughter voice concern, nurse was move from Room 4003 to 4025 closer to the nursing station. PA on called was notify of room change.

## 2012-06-07 NOTE — Progress Notes (Signed)
Subjective: Patient seen and examined this morning. Denies any symptoms. No overnight issues  Objective:  Vital signs in last 24 hours:  Filed Vitals:   06/06/12 0630 06/06/12 1432 06/06/12 2250 06/07/12 0900  BP: 146/73 121/60 147/56   Pulse: 71 75 60   Temp: 98.7 F (37.1 C) 98.8 F (37.1 C) 98.4 F (36.9 C)   TempSrc: Oral Oral Oral   Resp: 20 18 18    Height:      Weight: 79.924 kg (176 lb 3.2 oz)   85 kg (187 lb 6.3 oz)  SpO2: 97% 97% 94%     Intake/Output from previous day:  No intake or output data in the 24 hours ending 06/07/12 0957  Physical Exam:  General:elderly female in no acute distress. HEENT: no pallor, no icterus, moist oral mucosa, no JVD, no lymphadenopathy Heart: Normal  s1 &s2  Regular rate and rhythm, without murmurs, rubs, gallops. Lungs: Clear to auscultation bilaterally. Abdomen: Soft, nontender, nondistended, positive bowel sounds. Extremities: No clubbing cyanosis or edema with positive pedal pulses. Neuro: Alert, awake, oriented x3, nonfocal.   Lab Results:  Basic Metabolic Panel:    Component Value Date/Time   NA 139 06/05/2012 0450   K 3.8 06/05/2012 0450   CL 109 06/05/2012 0450   CO2 21 06/05/2012 0450   BUN 11 06/05/2012 0450   CREATININE 0.63 06/05/2012 0450   GLUCOSE 122* 06/05/2012 0450   CALCIUM 8.5 06/05/2012 0450   CBC:    Component Value Date/Time   WBC 8.6 06/05/2012 0450   HGB 11.3* 06/05/2012 0450   HCT 34.6* 06/05/2012 0450   PLT 203 06/05/2012 0450   MCV 94.8 06/05/2012 0450   NEUTROABS 6.9 06/04/2012 1837   LYMPHSABS 1.4 06/04/2012 1837   MONOABS 1.1* 06/04/2012 1837   EOSABS 0.0 06/04/2012 1837   BASOSABS 0.0 06/04/2012 1837    Recent Results (from the past 240 hour(s))  CULTURE, BLOOD (ROUTINE X 2)     Status: Normal (Preliminary result)   Collection Time   06/04/12  1:35 PM      Component Value Range Status Comment   Specimen Description BLOOD RIGHT AC   Final    Special Requests BOTTLES DRAWN AEROBIC AND ANAEROBIC 5 CC EACH    Final    Culture  Setup Time 06/04/2012 19:10   Final    Culture     Final    Value:        BLOOD CULTURE RECEIVED NO GROWTH TO DATE CULTURE WILL BE HELD FOR 5 DAYS BEFORE ISSUING A FINAL NEGATIVE REPORT   Report Status PENDING   Incomplete   URINE CULTURE     Status: Normal   Collection Time   06/04/12  2:23 PM      Component Value Range Status Comment   Specimen Description URINE, CLEAN CATCH   Final    Special Requests NONE   Final    Culture  Setup Time 06/04/2012 19:09   Final    Colony Count 95,000 COLONIES/ML   Final    Culture     Final    Value: PROTEUS MIRABILIS     ESCHERICHIA COLI   Report Status 06/07/2012 FINAL   Final    Organism ID, Bacteria PROTEUS MIRABILIS   Final    Organism ID, Bacteria ESCHERICHIA COLI   Final   CULTURE, BLOOD (ROUTINE X 2)     Status: Normal (Preliminary result)   Collection Time   06/04/12  2:39 PM  Component Value Range Status Comment   Specimen Description BLOOD RIGHT HAND   Final    Special Requests BOTTLES DRAWN AEROBIC ONLY 2 CC   Final    Culture  Setup Time 06/04/2012 19:10   Final    Culture     Final    Value:        BLOOD CULTURE RECEIVED NO GROWTH TO DATE CULTURE WILL BE HELD FOR 5 DAYS BEFORE ISSUING A FINAL NEGATIVE REPORT   Report Status PENDING   Incomplete     Studies/Results: No results found.  Medications: Scheduled Meds:   . carbidopa-levodopa  1 tablet Oral TID  . cefTRIAXone (ROCEPHIN)  IV  2 g Intravenous Q24H  . psyllium  1 packet Oral Daily   Continuous Infusions:   . sodium chloride 150 mL/hr at 06/07/12 0717   PRN Meds:.acetaminophen, acetaminophen  Assessment/Plan:  *Fever  -related to UTI with cx growing e coli and proteus both sensitive to cipro - continue cipro 500 mg bid for 4 more days as outlined in discharge summary ( to complete a 7 day course) - blood cx negative  -remains afebrile   Parkinson disease  - continue carbidopa   Hypokalemia  - resolved   Patient seen by CIR  and  agreed for admission to inpatient rehab   Stable for discharge today.   LOS: 3 days   Alinah Sheard 06/07/2012, 9:57 AM

## 2012-06-08 LAB — DIFFERENTIAL
Basophils Absolute: 0 10*3/uL (ref 0.0–0.1)
Lymphocytes Relative: 34 % (ref 12–46)
Monocytes Absolute: 0.4 10*3/uL (ref 0.1–1.0)
Monocytes Relative: 9 % (ref 3–12)
Neutro Abs: 2.6 10*3/uL (ref 1.7–7.7)

## 2012-06-08 LAB — CBC
HCT: 31.9 % — ABNORMAL LOW (ref 36.0–46.0)
Hemoglobin: 10.7 g/dL — ABNORMAL LOW (ref 12.0–15.0)
RDW: 13.1 % (ref 11.5–15.5)
WBC: 4.9 10*3/uL (ref 4.0–10.5)

## 2012-06-08 LAB — COMPREHENSIVE METABOLIC PANEL
BUN: 6 mg/dL (ref 6–23)
Calcium: 8.5 mg/dL (ref 8.4–10.5)
Creatinine, Ser: 0.49 mg/dL — ABNORMAL LOW (ref 0.50–1.10)
GFR calc Af Amer: >90 mL/min (ref >90)
Glucose, Bld: 95 mg/dL (ref 70–99)
Total Protein: 5.9 g/dL — ABNORMAL LOW (ref 6.0–8.3)

## 2012-06-08 MED ORDER — TAB-A-VITE/IRON PO TABS
1.0000 | ORAL_TABLET | Freq: Every day | ORAL | Status: DC
  Administered 2012-06-08 – 2012-06-16 (×9): 1 via ORAL
  Filled 2012-06-08 (×10): qty 1

## 2012-06-08 MED ORDER — POTASSIUM CHLORIDE CRYS ER 20 MEQ PO TBCR
20.0000 meq | EXTENDED_RELEASE_TABLET | Freq: Every day | ORAL | Status: DC
  Administered 2012-06-08 – 2012-06-16 (×8): 20 meq via ORAL
  Filled 2012-06-08 (×10): qty 1

## 2012-06-08 NOTE — Progress Notes (Signed)
Social Work  Social Work Assessment and Plan  Patient Details  Name: Meagan [name] MRN: [id number] Date of Birth: [birth year]  Today's Date: 06/08/2012  Problem List:  Patient Active Problem List  Diagnosis  . Parkinson disease  . UTI (urinary tract infection), bacterial  . Physical deconditioning   Past Medical History:  Past Medical History  Diagnosis Date  . Hypertension   . Tremor   . History of gallstones    Past Surgical History:  Past Surgical History  Procedure Date  . Shoulder surgery   . Knee surgery   . Colonoscopy    Social History:  reports that she has never smoked. She has never used smokeless tobacco. She reports that she does not drink alcohol or use illicit drugs.  Family / Support Systems Marital Status: Divorced How Long?: "a long time" Patient Roles: Parent Children: daughter, Meagan [name] (lives with patient) @ (H) [phone] or (C) [phone];  pt has seven other children with all living "up [place]" Anticipated Caregiver: Daughter Ability/Limitations of Caregiver: Daughter does work f/t days (M-F), yet reports that her siblings are going to share in coming to town to stay a couple of weeks each to provide assistance to pt Caregiver Availability: 24/7 Family Dynamics: Pt describes very good support from all family, with local daughter her primary support.  Daughter notes good relationships among sibling.  Social History Preferred language: English Religion:  Cultural Background: NA Education: HS grad - nothing further Read: Yes Write: Yes Employment Status: Retired Date Retired/Disabled/Unemployed: 2002 Age Retired: 65  Fish farm manager Issues: None Guardian/Conservator: No POA or legal rep at this time, however, plans are to make her eldest son her POA. - per daughter   Abuse/Neglect Physical Abuse: Denies Verbal Abuse: Denies Sexual Abuse: Denies Exploitation of patient/patient's resources: Denies Self-Neglect:  Denies  Emotional Status Pt's affect, behavior adn adjustment status: Pt with slowed speech and low volume, therefore, very difficult to hear/ understand her responses.  She does attempt to answer questions and answers appear accurate, however, she turns to daughter to have answers elaborated on.  Very calm and not showing/ reporting any s/s of depression or anxiety and daughter denies any h/o of these issues.  Depression screen deferred at this time but will continue to monitor. Recent Psychosocial Issues: None Pyschiatric History: None Substance Abuse History: None  Patient / Family Perceptions, Expectations & Goals Pt/Family understanding of illness & functional limitations: Pt and daughter with general understanding of the role of a recent UTI/ fever contributing to her Parkinson's exacerbation and, subsequently, her need for CIR txs for increasing overall strength. Premorbid pt/family roles/activities: Pt was independent overall with her self - care.  Limitied community activity as daughter works f/t and pt does not drive.  Daughter reports, "I'd try to get her out of the house at least once a week if we could".  Daughter manages medications and majority of household duties. Anticipated changes in roles/activities/participation: Pt may require some increase in both physical and cognitive assistance.  May require 24/7 assistance initially.   Pt/family expectations/goals: Daughter aware 24/7 assist likely initially, but both are hopeful that she will regain prior level of function within a few weeks after d/c.  Community Resources [employer]: Other (Comment) ([facility] (already a client)) Premorbid Home Care/DME Agencies: None Transportation available at discharge: yes Resource referrals recommended: Support group (specify);Other (Comment) (Would benefit from community adult programs )  Discharge Planning Living Arrangements: Children Support Systems: Children Type of Residence: Private  residence [employer]  Resources: Administrator (specify) ([employer] (secondary)) Financial Resources: Restaurant manager, fast food Screen Referred: No Living Expenses: Own Money Management: Family Do you have any problems obtaining your medications?: No Home Management: daughter Patient/Family Preliminary Plans: Pt plans to return to her own home with daughter and other children to share in providing 24/7 care (temporary basis i.e. 4 weeks apprx.) Social Work Anticipated Follow Up Needs: HH/OP;Other (comment) (Adult activity programs?) Expected length of stay: 1-2 weeks  Clinical Impression Pleasant, generally oriented, elderly woman here after Parkinson's exacerbation.  Difficult to hear/ understand speech, therefore, daughter offers additional information.  Daughter very attentive and supportive and notes family is prepared to provide 24/7 assist to patient after d/c "for a few weeks", however, interested in possible community programs for pt as well "...  To keep her as active as possible".  Will provide info on community programs and local Parkinson's support Groups.   Meagan [name] 06/08/2012, 1:47 PM

## 2012-06-08 NOTE — Evaluation (Signed)
Occupational Therapy Assessment and Plan  Patient Details  Name: Meagan [name] MRN: [id number] Date of Birth: 04-30-1935  OT Diagnosis: cognitive deficits and muscle weakness (generalized) Rehab Potential: Rehab Potential: Good ELOS: 10-12 days   Today's Date: 06/08/2012 Time: 1030-1140 Time Calculation (min): 70 min OT eval and self care retraining.  Focus session on activity tolerance, walker safety, static and dynamic balance, sit><stand (many times!), problem solving and frustration tolerance.   Daughter present and realizes that patient will need 24/7 assistance upon discharge.  Problem List:  Patient Active Problem List  Diagnosis  . Parkinson disease  . UTI (urinary tract infection), bacterial  . Physical deconditioning    Past Medical History:  Past Medical History  Diagnosis Date  . Hypertension   . Tremor   . History of gallstones    Past Surgical History:  Past Surgical History  Procedure Date  . Shoulder surgery   . Knee surgery   . Colonoscopy     Assessment & Plan Clinical Impression: 77 y.o. female with history of Parkinson's disease who was admitted via ED on 06/04/12 due to acute onset unresponsives at home. Noted to be febrile with temp of 104. Started on IV ceftriaxone for presumptive UTI with improvement in mentation. Culture positive for E.Coli and proteus.  Patient was noted to have delayed processing and generalized weakness.  Foley D/Cd 07/02, voiding with some incontinence.Patient transferred to CIR on 06/07/2012 .    Patient currently requires min - mod assist with basic self-care skills secondary to muscle weakness, decreased cardiorespiratoy endurance, decreased initiation, decreased attention, decreased awareness, decreased problem solving, decreased safety awareness, decreased memory and delayed processing and decreased standing balance and decreased balance strategies.  Prior to hospitalization, patient's daughter states patient would not let  her assist with bathing so unsure if needed assistance yet states that her mother could complete BADL with Mod I unless seated on low surface and daughter called patient during the day to be sure she had taken her medication, etc.  Patient will benefit from skilled intervention to decrease level of assist with basic self-care skills prior to discharge home with care partner.  Anticipate patient will require 24 hour supervision and minimal physical assistance and follow up home health.  OT - End of Session Endurance Deficit: Yes Endurance Deficit Description: Easily fatigued with low level activity OT Assessment Rehab Potential: Good OT Plan OT Frequency: 1-2 X/day, 60-90 minutes;5 out of 7 days Estimated Length of Stay: 10-12 days OT Treatment/Interventions: Balance/vestibular training;Discharge planning;DME/adaptive equipment instruction;Functional mobility training;Patient/family education;Self Care/advanced ADL retraining;Psychosocial support;Therapeutic Activities;UE/LE Coordination activities;UE/LE Strength taining/ROM OT Recommendation Follow Up Recommendations: Home health OT Equipment Recommended: Wheelchair (measurements);Wheelchair cushion (measurements);Rolling walker with 5" wheels;Tub/shower seat  OT Evaluation Precautions/Restrictions  Precautions Precautions: Fall  Pain No c/o pain  Home Living/Prior Functioning Home Living Lives With: Daughter (daughter works 7a-5p) Available Help at Discharge: Family (another daughter will come in for 1-2 weeks then son will) Type of Home: House Home Access: Level entry Home Layout: One level Bathroom Shower/Tub: Walk-in shower;Door [employer] Toilet: Standard Bathroom Accessibility: Yes How Accessible: Accessible via walker Home Adaptive Equipment: Walker - rolling (Pt currently using a plastic lawn chair for shower) Additional Comments: Daughter states that if her mom still needs assistance after her siblings have stayed as long  as they can, she will lessen her work hours. Prior Function Level of Independence: Independent with basic ADLs;Needs assistance with homemaking Meal Prep: Maximal Homemaking Assistance Comments: Per daughter, PTA patient would not let daughter  assist with bathing and occasionally patient needed assist to stand from a low surface. 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 Leisure: Hobbies-yes (Comment) Comments: Likes puzzles Vision/Perception  Vision - History Baseline Vision: Wears glasses only for reading  Cognition Overall Cognitive Status: Impaired Arousal/Alertness: Awake/alert Orientation Level: Oriented to person Memory: Impaired Memory Impairment: Decreased short term memory;Decreased recall of new information Awareness: Impaired Awareness Impairment: Intellectual impairment Problem Solving: Impaired Executive Function: Reasoning;Initiating;Self Correcting Reasoning: Impaired Initiating: Impaired Self Correcting: Impaired Behaviors: Poor frustration tolerance Safety/Judgment: Impaired Comments: Easily frustrated when unable to problem solve effectively during BADL tasks Sensation Sensation Light Touch: Appears Intact Proprioception: Impaired Detail Additional Comments: SLightly impaired great toe proprioception of Rt. foot.  Coordination Gross Motor Movements are Fluid and Coordinated: No Fine Motor Movements are Fluid and Coordinated: No Coordination and Movement Description: Significant tremors noted of bil. UEs and LEs. Tremors limit functional mobility and ADLs  Mobility  Transfers Sit to Stand: 4: Min assist Sit to Stand Details: Verbal cues for technique Sit to Stand Details (indicate cue type and reason): Patient often took 3-5 tries to stand up. Stand to Sit: 1: +1 Total assist Stand to Sit Details: Uncontrolled descent, tends to sit prior to being safely squared with chair or mat. Verbal cues and  assist needed to safely sit.    Balance Static Sitting Balance Static Sitting - Balance Support: Bilateral upper extremity supported;Feet supported Static Sitting - Level of Assistance: 5: Stand by assistance Static Sitting - Comment/# of Minutes: Posterior weightshift noted, posterior pelvic tilt.  Static Standing Balance Static Standing - Balance Support: No upper extremity supported Static Standing - Level of Assistance: 2: Max assist Static Standing - Comment/# of Minutes: Significant posterior lean in static stance. Verbal cues and manual faciltation for anterior weight shift.  Extremity/Trunk Assessment RUE Assessment RUE Assessment: Within Functional Limits (for BADL tasks) LUE Assessment LUE Assessment: Within Functional Limits (for BADL tasks)  See FIM for current functional status Refer to Care Plan for Long Term Goals  Recommendations for other services: None  Discharge Criteria: Patient will be discharged from OT if patient refuses treatment 3 consecutive times without medical reason, if treatment goals not met, if there is a change in medical status, if patient makes no progress towards goals or if patient is discharged from hospital.  The above assessment, treatment plan, treatment alternatives and goals were discussed and mutually agreed upon: by patient and by family  Meagan [name] 06/08/2012, 12:39 PM

## 2012-06-08 NOTE — Progress Notes (Signed)
Patient information reviewed and entered into [facility] system by Arpi Diebold, RN, CRRN, PPS Coordinator.  Information including medical coding and functional independence measure will be reviewed and updated through discharge.     Per nursing patient was given "Data Collection Information Summary for Patients in Inpatient Rehabilitation Facilities with attached "Privacy Act Statement-Health Care Records" upon admission.   

## 2012-06-08 NOTE — Care Management (Signed)
Inpatient Rehabilitation Center Individual Statement of Services  Patient Name:  Meagan [name]  Date:  06/08/2012  Welcome to the Inpatient Rehabilitation Center.  Our goal is to provide you with an individualized program based on your diagnosis and situation, designed to meet your specific needs.  With this comprehensive rehabilitation program, you will be expected to participate in at least 3 hours of rehabilitation therapies Monday-Friday, with modified therapy programming on the weekends.  Your rehabilitation program will include the following services:  Physical Therapy (PT), Occupational Therapy (OT), Speech Therapy (ST), 24 hour per day rehabilitation nursing, Therapeutic Recreaction (TR), Case Management (RN and Child psychotherapist), Rehabilitation Medicine, Nutrition Services and Pharmacy Services  Weekly team conferences will be held on  Tuesday  to discuss your progress.  Your RN Case Designer, television/film set will talk with you frequently to get your input and to update you on team discussions.  Team conferences with you and your family in attendance may also be held.  Expected length of stay: @ 2 weeks    Overall anticipated outcome: Supervision-Min Assist  Depending on your progress and recovery, your program may change.  Your RN Case Estate agent will coordinate services and will keep you informed of any changes.  Your RN Sports coach and SW names and contact numbers are listed  below.  The following services may also be recommended but are not provided by the Inpatient Rehabilitation Center:   Driving Evaluations  Home Health Rehabiltiation Services  Outpatient Rehabilitatation [facility]  Vocational Rehabilitation   Arrangements will be made to provide these services after discharge if needed.  Arrangements include referral to agencies that provide these services.  Your insurance has been verified to be:  [employer] + [employer] Your primary doctor  is:  Dr Fleet Contras  Pertinent information will be shared with your doctor and your insurance company.  Case Manager: Melanee Spry, [facility] [phone]  Social Worker:  [place], [place] [phone]  Information discussed with and copy given to patient by: Brock Ra, 06/08/2012, 1:23 PM

## 2012-06-08 NOTE — Progress Notes (Signed)
Physical Therapy Session Note  Patient Details  Name: Meagan [name] MRN: [id number] Date of Birth: 04/06/1935  Today's Date: 06/08/2012 Time: 4098-1191 Time Calculation (min): 28 min  Short Term Goals: Week 1:  PT Short Term Goal 1 (Week 1): Pt will perform sit <> stand with moderate assist consistently.  PT Short Term Goal 1 - Progress (Week 1): Progressing toward goal PT Short Term Goal 2 (Week 1): Pt will transfer bed <> wheelchair with moderate assist consistently. PT Short Term Goal 2 - Progress (Week 1): Progressing toward goal PT Short Term Goal 3 (Week 1): Pt will ambulate 30' with moderate assist and RW.  PT Short Term Goal 3 - Progress (Week 1): Progressing toward goal  Skilled Therapeutic Interventions/Progress Updates:    Pt with decreased tremors and improved functional mobility compared to evaluation this morning - likely contributed to having Parkinson's medication on board this session. Initially practiced wheelchair mobility x 35', pt has difficulty finding wheels and how to push them. Requires hand over hand assist for turns and max verbal cues. Rest of session primarily focused on pre-stand mechanics (LE positioning, scooting to EOB, anterior pelvic tilt and anterior trunk translation). Provided pt with sheet stretch to promote anterior pelvic tilt. Progressed to repeated sit <> stands from elevated surface with moderate assistance, hands on rolling table in front to promote anterior translation.   Pt's daughter present during session, educated on need for 24 hour supervision/assist at D/C, she verbalized understanding. I feel she may be unrealistic in thoughts that her mother will be able to wean off of this in a few weeks. Given cognitive deficits and progressive memory decline pt may not be able to reach a point where she is safe alone.    Therapy Documentation Precautions:  Precautions Precautions: Fall Restrictions Weight Bearing Restrictions: No Pain: Pain  Assessment Pain Assessment: No/denies pain Locomotion : Wheelchair Mobility Distance: 58'   See FIM for current functional status  Therapy/Group: Individual Therapy  Wilhemina Bonito 06/08/2012, 5:28 PM

## 2012-06-08 NOTE — Evaluation (Signed)
Recreational Therapy Assessment and Plan  Patient Details  Name: Meagan [name] MRN: [id number] Date of Birth: [birth year] Today's Date: 06/08/2012  Rehab Potential: Good ELOS: 2 weeks   Assessment Clinical Impression:Problem List:  Patient Active Problem List   Diagnosis   .  Parkinson disease   .  UTI (urinary tract infection), bacterial   .  Physical deconditioning    Past Medical History:  Past Medical History   Diagnosis  Date   .  Hypertension    .  Tremor    .  History of gallstones     Past Surgical History:  Past Surgical History   Procedure  Date   .  Shoulder surgery    .  Knee surgery    .  Colonoscopy     Assessment & Plan  Clinical Impression: 77 y.o. female with history of Parkinson's disease who was admitted via ED on 06/04/12 due to acute onset unresponsives at home. Noted to be febrile with temp of 104. Started on IV ceftriaxone for presumptive UTI with improvement in mentation. Culture positive for E.Coli and proteus. Patient was noted to have delayed processing and generalized weakness. Foley D/Cd 07/02, voiding with some incontinence.Patient transferred to CIR on 06/07/2012.   Pt presents with decreased activity tolerance, decreased functional mobility, decreased balance,decreased cognition/awareness limiting pt's independence with leisure/community pursuits.   Leisure History/Participation Premorbid leisure interest/current participation: [place] - Flower gardening;Community - Grocery store;Community - Shopping mall;Games - Jig-saw puzzles (decorating) Other Leisure Interests: Television;Reading (limited by vision, needs new Rx for  glasses) Leisure Participation Style: With Family/Friends;Alone Awareness of Community Resources: Good-identify 3 post discharge leisure resources Psychosocial / Spiritual Stress Management: Good Patient agreeable to Pet Therapy: Yes Does patient have pets?: No Social interaction - Mood/Behavior: Cooperative English as a second language teacher for Education?: Yes Patient Agreeable to Outing?: Yes Recreational Therapy Orientation Orientation -Reviewed with patient: Available activity resources Strengths/Weaknesses Patient Strengths/Abilities: Willingness to participate Patient weaknesses: Physical limitations  Plan Rec Therapy Plan Is patient appropriate for Therapeutic Recreation?: Yes Rehab Potential: Good Treatment times per week: Min 1 time per week > 20 minutes Estimated Length of Stay: 2 weeks TR Treatment/Interventions: Adaptive equipment instruction;1:1 session;Balance/vestibular training;Community reintegration;Functional mobility training;Recreation/leisure participation;Therapeutic activities;Therapeutic exercise  Recommendations for other services: None  Discharge Criteria: Patient will be discharged from TR if patient refuses treatment 3 consecutive times without medical reason.  If treatment goals not met, if there is a change in medical status, if patient makes no progress towards goals or if patient is discharged from hospital.  The above assessment, treatment plan, treatment alternatives and goals were discussed and mutually agreed upon: by patient  Divonte Senger 06/08/2012, 3:14 PM

## 2012-06-08 NOTE — Progress Notes (Addendum)
Patient ID: Meagan [name], female   DOB: 10-10-1935, 77 y.o.   MRN: [id number] Subjective/Complaints: Runny nose and cough. Concerned that nobody is tending to her calls. Confused Other ROS items are negative.  Objective: Vital Signs: Blood pressure 164/64, pulse 61, temperature 98.5 F (36.9 C), temperature source Oral, resp. rate 18, height 5\' 11"  (1.803 m), weight 79.561 kg (175 lb 6.4 oz), SpO2 95.00%. No results found.  Basename 06/08/12 0635  WBC 4.9  HGB 10.7*  HCT 31.9*  PLT 210    Basename 06/08/12 0635  NA 145  K 3.0*  CL 111  CO2 26  GLUCOSE 95  BUN 6  CREATININE 0.49*  CALCIUM 8.5   CBG (last 3)   Basename 06/07/12 0727 06/06/12 0730 06/05/12 2218  GLUCAP 91 103* 108*    Wt Readings from Last 3 Encounters:  06/07/12 79.561 kg (175 lb 6.4 oz)  06/07/12 85 kg (187 lb 6.3 oz)  02/08/12 80.287 kg (177 lb)    Physical Exam:  General appearance: alert, cooperative and mild distress, anxious Head: Normocephalic, without obvious abnormality, atraumatic Eyes: conjunctivae/corneas clear. PERRL, EOM's intact. Fundi benign. Ears: normal TM's and external ear canals both ears Nose: Nares normal. Septum midline. Mucosa normal. No drainage or sinus tenderness. Throat: lips, mucosa, and tongue normal; teeth and gums normal Neck: no adenopathy, no carotid bruit, no JVD, supple, symmetrical, trachea midline and thyroid not enlarged, symmetric, no tenderness/mass/nodules Back: symmetric, no curvature. ROM normal. No CVA tenderness. Resp: clear to auscultation bilaterally Cardio: regular rate and rhythm, S1, S2 normal, no murmur, click, rub or gallop GI: soft, non-tender; bowel sounds normal; no masses,  no organomegaly Extremities: extremities normal, atraumatic, no cyanosis or edema Pulses: 2+ and symmetric Skin: Skin color, texture, turgor normal. No rashes or lesions Neurologic: Pt with ongoing pill rolling and intentional tremors. Moves all 4's with proximal  weakness noted. No gross sensory deficits, CN exam normal. Oriented to person. Incision/Wound:     Assessment/Plan: 1. Functional deficits secondary to deconditioning, parkinson's pseudoexacerbation which require 3+ hours per day of interdisciplinary therapy in a comprehensive inpatient rehab setting. Physiatrist is providing close team supervision and 24 hour management of active medical problems listed below. Physiatrist and rehab team continue to assess barriers to discharge/monitor patient progress toward functional and medical goals. FIM:       FIM - Toileting Toileting: 1: Two helpers        FIM - Locomotion: Ambulation Ambulation/Gait Assistance: 1: +2 Total assist  Comprehension Comprehension Mode: Auditory Comprehension: 2-Understands basic 25 - 49% of the time/requires cueing 51 - 75% of the time  Expression Expression Mode: Verbal Expression: 2-Expresses basic 25 - 49% of the time/requires cueing 50 - 75% of the time. Uses single words/gestures.  Social Interaction Social Interaction: 2-Interacts appropriately 25 - 49% of time - Needs frequent redirection.  Problem Solving Problem Solving: 1-Solves basic less than 25% of the time - needs direction nearly all the time or does not effectively solve problems and may need a restraint for safety  Memory Memory: 1-Recognizes or recalls less than 25% of the time/requires cueing greater than 75% of the time   MEDICAL PROBLEM LIST  1. DVT Prophylaxis/Anticoagulation: Pharmaceutical: Lovenox  2. Pain Management: N/A  3. Mood: Monitor for now. Confusion feeds her anxiety however. 4. Neuropsych: This patient is not capable of making decisions on his/her own behalf. Likely baseline dementia.  5. Parkinson's disease: Continue sinemet IR tid  6. E coli/Proteus UTI: on cipro thru 7/6.  Patient remains afebrile. Follow voiding habits.  7. HTN: New diagnosis. Start low dose lisinopril and titrate slowly as needed. Monitor  with bid checks 8. Anemia: MVI 9. Mild hypokalemia: supplement and recheck monday   LOS (Days) 1 A FACE TO FACE EVALUATION WAS PERFORMED  Kymber Kosar T 06/08/2012, 9:06 AM

## 2012-06-08 NOTE — Evaluation (Signed)
Speech Language Pathology Assessment and Plan  Patient Details  Name: Meagan [name] MRN: [id number] Date of Birth: [birth year]  SLP Diagnosis: Cognitive Impairments  Rehab Potential: Good ELOS: 10-12 days   Today's Date: 06/08/2012 Time: 0930-1030 Time Calculation (min): 60 min  Problem List:  Patient Active Problem List  Diagnosis  . Parkinson disease  . UTI (urinary tract infection), bacterial  . Physical deconditioning   Past Medical History:  Past Medical History  Diagnosis Date  . Hypertension   . Tremor   . History of gallstones    Past Surgical History:  Past Surgical History  Procedure Date  . Shoulder surgery   . Knee surgery   . Colonoscopy     Assessment / Plan / Recommendation Clinical Impression  Pt transferred to CIR 06/07/12 and presents with moderate cognitive impairments characterized by impaired sustained attention,  intellectual awareness, orientation, functional problem solving, initiation, safety awareness and short-term memory.  Pt also demonstrated decreased comprehension of mildly complex information and decreased thought organization with impaired word-finding. Pt would benefit from skilled SLP intervention to maximize cognitive function and overall safety for discharge home with family. Anticipate pt will need f/u home health SLP services and 24 hour supervision.     SLP Assessment  Patient will need skilled Speech Lanaguage Pathology Services during CIR admission    Recommendations  Follow up Recommendations: Home Health SLP Equipment Recommended: None recommended by SLP    SLP Frequency 1-2 X/day, 30-60 minutes   SLP Treatment/Interventions Cognitive remediation/compensation;Cueing hierarchy;Functional tasks;Internal/external aids;Speech/Language facilitation;Patient/family education;Therapeutic Activities;Environmental controls    Pain Pain Assessment Pain Assessment: No/denies pain Prior Functioning Cognitive/Linguistic Baseline:  Baseline deficits Type of Home: House Lives With: Daughter (daughter works 7a-5p) Available Help at Discharge: Family (another daughter will come in for 1-2 weeks then son will) Vocation: Retired  Teacher, music Term Goals: Week 1: SLP Short Term Goal 1 (Week 1): Pt will sustain attention to a functional task for ~ 10 minutes with Min A question and verbal cues for redirection SLP Short Term Goal 2 (Week 1): Pt will initiate functional tasks with Mod A verbal and questioning cues. SLP Short Term Goal 3 (Week 1): Pt will demonstrate functional problem solving during basic and familiar tasks with Mod A verbal and visual cues. SLP Short Term Goal 4 (Week 1): Pt will utilize external aids for orientaton to place, time and situation with Mod A verbal and questioning cues.  SLP Short Term Goal 5 (Week 1): Pt will demonstrate use of strategies to increase word-finding during functional conversation with Mod A verbal and question cues.   See FIM for current functional status Refer to Care Plan for Long Term Goals  Recommendations for other services: None  Discharge Criteria: Patient will be discharged from SLP if patient refuses treatment 3 consecutive times without medical reason, if treatment goals not met, if there is a change in medical status, if patient makes no progress towards goals or if patient is discharged from hospital.  The above assessment, treatment plan, treatment alternatives and goals were discussed and mutually agreed upon: by patient and by family  Meagan [name] 06/08/2012, 3:50 PM

## 2012-06-08 NOTE — Evaluation (Signed)
Physical Therapy Assessment and Plan  Patient Details  Name: Meagan [name] MRN: [id number] Date of Birth: 1935-06-22  PT Diagnosis: Abnormal posture, Abnormality of gait, Cognitive deficits, Coordination disorder, Difficulty walking, Impaired cognition and Muscle weakness Rehab Potential: Good ELOS: ~3 weeks   Today's Date: 06/08/2012 Time: 4098-1191 Time Calculation (min): 63 min  Problem List:  Patient Active Problem List  Diagnosis  . Parkinson disease  . UTI (urinary tract infection), bacterial  . Physical deconditioning    Past Medical History:  Past Medical History  Diagnosis Date  . Hypertension   . Tremor   . History of gallstones    Past Surgical History:  Past Surgical History  Procedure Date  . Shoulder surgery   . Knee surgery   . Colonoscopy     Assessment & Plan Clinical Impression: 77 y.o. female with history of Parkinson's disease who was admitted via ED on 06/04/12 due to acute onset unresponsives at home. Noted to ber febrile with temp of 104. Started on IV ceftriaxone for presumptive UTI with improvement in mentation. Patient transferred to CIR on 06/07/2012 .   Patient currently requires total with mobility secondary to muscle weakness, impaired timing and sequencing, unbalanced muscle activation, decreased coordination and decreased motor planning, decreased initiation, decreased awareness, decreased problem solving, decreased safety awareness, decreased memory and delayed processing and decreased sitting balance, decreased standing balance and decreased balance strategies. Pt currently most limited by posterior lean during sit <> stand and in standing in addition to decreased cognition. Pt with significant tremors during evaluation, likely due to not having received Parkinson's medication yet.   Prior to hospitalization, patient was modified independent with mobility and lived with Daughter (Daughter works (7a-5p) but lessening hours soon) in a House  home.  Home access is  Level entry.  Patient will benefit from skilled PT intervention to maximize safe functional mobility, minimize fall risk and decrease caregiver burden for planned discharge home with 24 hour assist.  Anticipate patient will benefit from follow up [facility] at discharge.  PT - End of Session Endurance Deficit: Yes Endurance Deficit Description: Easily fatigued with low level activity PT Assessment Rehab Potential: Good Barriers to Discharge: Decreased caregiver support PT Plan PT Frequency: 2-3 X/day, 60-90 minutes Estimated Length of Stay: 2-2.5 weeks PT Treatment/Interventions: Ambulation/gait training;Balance/vestibular training;Cognitive remediation/compensation;Functional mobility training;Neuromuscular re-education;Patient/family education;Psychosocial support;UE/LE Strength taining/ROM;Therapeutic Activities;Therapeutic Exercise;UE/LE Coordination activities;Wheelchair propulsion/positioning PT Recommendation Follow Up Recommendations: Home health PT;24 hour supervision/assistance Equipment Recommended: Wheelchair (measurements);Wheelchair cushion (measurements);Rolling walker with 5" wheels  PT Evaluation Precautions/Restrictions Precautions Precautions: Fall  Vital Signs Therapy Vitals Temp: 98.5 F (36.9 C) Temp src: Oral Pulse Rate: 61  Resp: 18  BP: 164/64 mmHg Patient Position, if appropriate: Lying Oxygen Therapy SpO2: 95 % O2 Device: None (Room air) Pain Pain Assessment Pain Assessment: No/denies pain Home Living/Prior Functioning Home Living Lives With: Daughter (Daughter works (7a-5p) but lessening hours soon) Available Help at Discharge: Family Type of Home: House Home Access: Level entry Home Layout: One level Bathroom Shower/Tub: Tub/shower unit;Door [employer] Toilet: Standard Bathroom Accessibility: Yes How Accessible: Accessible via walker Home Adaptive Equipment: Shower chair with back Prior Function Level of Independence:  Independent with basic ADLs;Needs assistance with homemaking Meal Prep: Maximal Homemaking Assistance Comments: Daughter does all cooking and cleaning Able to Take Stairs?: Yes Driving: No Vocation: Retired Leisure: Hobbies-yes (Comment) Comments: Likes puzzles  Cognition Overall Cognitive Status: Impaired Arousal/Alertness: Awake/alert Orientation Level: Oriented to person;Disoriented to place;Oriented to time;Disoriented to situation Memory: Impaired Memory Impairment: Retrieval deficit;Storage deficit;Decreased  recall of new information;Decreased long term memory;Decreased short term memory Awareness: Impaired Problem Solving: Impaired Executive Function: Initiating;Self Correcting Initiating: Impaired Self Correcting: Impaired Safety/Judgment: Impaired Sensation Sensation Light Touch: Appears Intact Proprioception: Impaired Detail Additional Comments: SLightly impaired great toe proprioception of Rt. foot.  Coordination Gross Motor Movements are Fluid and Coordinated: No Fine Motor Movements are Fluid and Coordinated: No Coordination and Movement Description: Significant tremors noted of bil. UEs and LEs. Tremors limit functional mobility and ADLs  Mobility Bed Mobility Bed Mobility: Rolling Left;Left Sidelying to Sit Rolling Left: 4: Min assist Rolling Left Details: Tactile cues for initiation;Verbal cues for technique;Verbal cues for sequencing Left Sidelying to Sit: 4: Min assist Left Sidelying to Sit Details (indicate cue type and reason): Pt takes considerable amount of time to process and carry out task. Pt with very inefficient movement.  Transfers Sit to Stand: 1: +1 Total assist;2: Max assist Sit to Stand Details (indicate cue type and reason): Total assist for initial 2 stands, max assist with last 3 stands. Verbal/tactile cues required for bil. LE placement, scooting to EOB, and anteiror trunk translation. Pt requires assist to initiate movement. Significant  posterior lean throughout stand.  Stand to Sit: 1: +1 Total assist Stand to Sit Details: Uncontrolled descent, tends to sit prior to being safely squared with chair or mat. Verbal cues and assist needed to safely sit.  Stand Pivot Transfers: 1: +2 Total assist Stand Pivot Transfer Details (indicate cue type and reason): Inital transfer required 2 people for assist after multiple failed attempts. Then pt able to perform with +1 total assist two more times during session.  Locomotion  Ambulation Ambulation: Yes Ambulation/Gait Assistance: 1: +2 Total assist Ambulation Distance (Feet): 15 Feet Assistive device: Rolling walker Ambulation/Gait Assistance Details: Manual facilitation for weight shifting;Verbal cues for technique;Verbal cues for safe use of DME/AE Ambulation/Gait Assistance Details: Pt unable to ambulate 1' even with +2 total assist. With RW pt able to ambulate however requires another person for safety and to pull chair behind initially due to posterior lean. Posterior lean diminishes with increased distance.  Gait Gait: Yes Gait Pattern: Impaired Gait Pattern: Step-to pattern;Shuffle;Trunk flexed;Festinating;Wide base of support;Decreased step length - left;Decreased step length - right (min foot clearance bilaterally) Stairs / Additional Locomotion Stairs: Yes Stairs Assistance: 1: +2 Total assist Stairs Assistance Details: Tactile cues for initiation;Verbal cues for technique;Verbal cues for sequencing;Tactile cues for sequencing Stairs Assistance Details (indicate cue type and reason): 2 people for safety, max assist needed for steps. Pt needs step by step repeated verbal/tactile cues for each LE and UE, very limited short term memory.  Stair Management Technique: Two rails;Step to pattern;Forwards Number of Stairs: 2    Balance Static Sitting Balance Static Sitting - Balance Support: Bilateral upper extremity supported;Feet supported Static Sitting - Level of Assistance:  5: Stand by assistance Static Sitting - Comment/# of Minutes: Posterior weightshift noted, posterior pelvic tilt.  Static Standing Balance Static Standing - Balance Support: No upper extremity supported Static Standing - Level of Assistance: 2: Max assist Static Standing - Comment/# of Minutes: Significant posterior lean in static stance. Verbal cues and manual faciltation for anterior weight shift.  Extremity Assessment      RLE Assessment RLE Assessment: Exceptions to [facility] RLE AROM (degrees) RLE Overall AROM Comments: WFL with exception of limited dorsiflexion. RLE Strength RLE Overall Strength Comments: Generalized deconditioning grossly >/= 3+/5 however functionally very weak. LLE Assessment LLE Assessment: Exceptions to [facility] LLE AROM (degrees) LLE Overall AROM Comments: [facility]  with exception of limited dorsiflexion. LLE Strength LLE Overall Strength Comments: Generalized deconditioning grossly >/= 3+/5 however functionally very weak.  See FIM for current functional status Refer to Care Plan for Long Term Goals  Skilled Therapeutic Interventions/Progress Updates:  Performed repeated sit <> stands with max assist facilitating anterior weight shift through transition and while standing. Transfer training x 3 reps, improving from +2 total assist to max assist during session, Verbal cues for step by step sequencing. Pt very slow to initiate and will stand still until told exactly what to do with each extremity.    Recommendations for other services: None  Discharge Criteria: Patient will be discharged from PT if patient refuses treatment 3 consecutive times without medical reason, if treatment goals not met, if there is a change in medical status, if patient makes no progress towards goals or if patient is discharged from hospital.  The above assessment, treatment plan, treatment alternatives and goals were discussed and mutually agreed upon: by patient, family not available  Wilhemina Bonito 06/08/2012, 8:57 AM

## 2012-06-09 DIAGNOSIS — R5381 Other malaise: Secondary | ICD-10-CM

## 2012-06-09 DIAGNOSIS — N390 Urinary tract infection, site not specified: Secondary | ICD-10-CM

## 2012-06-09 DIAGNOSIS — Z5189 Encounter for other specified aftercare: Secondary | ICD-10-CM

## 2012-06-09 DIAGNOSIS — G20 Parkinson's disease: Secondary | ICD-10-CM

## 2012-06-09 NOTE — Progress Notes (Signed)
Patient ID: Meagan [name], female   DOB: 02-Aug-1935, 77 y.o.   MRN: [id number] Patient ID: Meagan [name], female   DOB: 1935/02/01, 77 y.o.   MRN: [id number] Subjective/Complaints: Had a better night. Comfortable today .Marland KitchenA 12 point review of systems has been performed and if not noted above is otherwise negative. .  Objective: Vital Signs: Blood pressure 172/79, pulse 63, temperature 98.8 F (37.1 C), temperature source Oral, resp. rate 17, height 5\' 11"  (1.803 m), weight 79.561 kg (175 lb 6.4 oz), SpO2 95.00%. No results found.  Basename 06/08/12 0635  WBC 4.9  HGB 10.7*  HCT 31.9*  PLT 210    Basename 06/08/12 0635  NA 145  K 3.0*  CL 111  CO2 26  GLUCOSE 95  BUN 6  CREATININE 0.49*  CALCIUM 8.5   CBG (last 3)   Basename 06/07/12 0727 06/06/12 0730  GLUCAP 91 103*    Wt Readings from Last 3 Encounters:  06/07/12 79.561 kg (175 lb 6.4 oz)  06/07/12 85 kg (187 lb 6.3 oz)  02/08/12 80.287 kg (177 lb)    Physical Exam:  General appearance: alert, cooperative and mild distress, more relaxed today Head: Normocephalic, without obvious abnormality, atraumatic Eyes: conjunctivae/corneas clear. PERRL, EOM's intact. Fundi benign. Ears: normal TM's and external ear canals both ears Nose: Nares normal. Septum midline. Mucosa normal. No drainage or sinus tenderness. Throat: lips, mucosa, and tongue normal; teeth and gums normal Neck: no adenopathy, no carotid bruit, no JVD, supple, symmetrical, trachea midline and thyroid not enlarged, symmetric, no tenderness/mass/nodules Back: symmetric, no curvature. ROM normal. No CVA tenderness. Resp: clear to auscultation bilaterally Cardio: regular rate and rhythm, S1, S2 normal, no murmur, click, rub or gallop GI: soft, non-tender; bowel sounds normal; no masses,  no organomegaly Extremities: extremities normal, atraumatic, no cyanosis or edema Pulses: 2+ and symmetric Skin: Skin color, texture, turgor normal. No rashes or  lesions Neurologic: Pt with ongoing pill rolling and intentional tremors. Moves all 4's with proximal weakness noted. No gross sensory deficits, CN exam normal. Oriented to person. Incision/Wound:     Assessment/Plan: 1. Functional deficits secondary to deconditioning, parkinson's pseudoexacerbation which require 3+ hours per day of interdisciplinary therapy in a comprehensive inpatient rehab setting. Physiatrist is providing close team supervision and 24 hour management of active medical problems listed below. Physiatrist and rehab team continue to assess barriers to discharge/monitor patient progress toward functional and medical goals. FIM: FIM - Bathing Bathing Steps Patient Completed: Chest;Right Arm;Left Arm;Abdomen;Front perineal area;Buttocks;Right upper leg;Left upper leg;Right lower leg (including foot);Left lower leg (including foot) Bathing: 4: Steadying assist  FIM - Upper Body Dressing/Undressing Upper body dressing/undressing steps patient completed: Thread/unthread right sleeve of pullover shirt/dresss;Thread/unthread left sleeve of pullover shirt/dress;Put head through opening of pull over shirt/dress;Pull shirt over trunk;Thread/unthread right sleeve of front closure shirt/dress;Thread/unthread left sleeve of front closure shirt/dress Upper body dressing/undressing: 4: Min-Patient completed 75 plus % of tasks FIM - Lower Body Dressing/Undressing Lower body dressing/undressing steps patient completed: Thread/unthread right pants leg;Thread/unthread left pants leg;Pull pants up/down Lower body dressing/undressing: 2: Max-Patient completed 25-49% of tasks  FIM - Toileting Toileting: 1: Two helpers  FIM - Diplomatic Services operational officer Devices: Psychiatrist Transfers: 1-Two helpers  FIM - Games developer Transfer: 4: Supine > Sit: Min A (steadying Pt. > 75%/lift 1 leg);1: Two helpers  FIM - Locomotion: Wheelchair Distance:  35' Locomotion: Wheelchair: 1: Travels less than 50 ft with moderate assistance (Pt: 50 - 74%) FIM - Locomotion: Ambulation  Locomotion: Ambulation Assistive Devices: Designer, industrial/product Ambulation/Gait Assistance: 4: Min assist (1 LOB with turn and Max Assist to recover) Locomotion: Ambulation: 2: Travels 50 - 149 ft with minimal assistance (Pt.>75%)  Comprehension Comprehension Mode: Auditory Comprehension: 5-Understands basic 90% of the time/requires cueing < 10% of the time  Expression Expression Mode: Verbal Expression: 4-Expresses basic 75 - 89% of the time/requires cueing 10 - 24% of the time. Needs helper to occlude trach/needs to repeat words.  Social Interaction Social Interaction: 4-Interacts appropriately 75 - 89% of the time - Needs redirection for appropriate language or to initiate interaction.  Problem Solving Problem Solving: 2-Solves basic 25 - 49% of the time - needs direction more than half the time to initiate, plan or complete simple activities  Memory Memory: 3-Recognizes or recalls 50 - 74% of the time/requires cueing 25 - 49% of the time   MEDICAL PROBLEM LIST  1. DVT Prophylaxis/Anticoagulation: Pharmaceutical: Lovenox  2. Pain Management: N/A  3. Mood: Monitor for now. Confusion feeds her anxiety however. More relaxed today 4. Neuropsych: This patient is not capable of making decisions on his/her own behalf. Likely baseline dementia.  5. Parkinson's disease: Continue sinemet IR tid  6. E coli/Proteus UTI: on cipro thru 7/6.  Patient remains afebrile. Follow voiding habits.  7. HTN: New diagnosis. Start low dose lisinopril and titrate slowly as needed. Monitor with bid checks 8. Anemia: MVI 9. Mild hypokalemia: supplement and recheck monday   LOS (Days) 2 A FACE TO FACE EVALUATION WAS PERFORMED  Linea Calles T 06/09/2012, 6:56 AM

## 2012-06-09 NOTE — Progress Notes (Signed)
Occupational Therapy Session Note  Patient Details  Name: Meagan [name] MRN: [id number] Date of Birth: 22-Apr-1935  Today's Date: 06/09/2012 Time: 0930-1030 Time Calculation (min): 60 min  Short Term Goals: Week 1:  OT Short Term Goal 1 (Week 1): STG=LTG of overall Supervision - Min assist  Skilled Therapeutic Interventions/Progress Updates:  Self care retraining to include sponge bath (declined shower), dress and groom at sink.  Patient confused stating she already had a bath.  With encouragement patient agreed to sponge bath.  Focused session on sustained attention, problem solving, activity tolerance, sit><stand and the vcs necessary to decrease need for assistance to stand, and standing balance.  Patient voicing several concerns regarding: all of the medication she is taking and that she is "off schedule" in terms of when she takes her medication for tremors, does not like having a female assist with any bathroom or bathing care (referring to female nurse), and does not like so many different people coming in to give her medication and to provide care.  Suggested that she have list of all medications she receives.  Patient really liked that idea therefore passed this task on to SLP to follow through.  Therapy Documentation Precautions:  Precautions Precautions: Fall Restrictions Weight Bearing Restrictions: No Pain: No reports of pain  See FIM for current functional status  Therapy/Group: Individual Therapy  [name] 06/09/2012, 11:52 AM

## 2012-06-09 NOTE — Progress Notes (Signed)
Speech Language Pathology Daily Session Note  Patient Details  Name: Emmani Lesueur MRN: [id number] Date of Birth: October 18, 1935  Today's Date: 06/09/2012 Time: 10:30-11:00 Time Calculation (min): 30 min  Short Term Goals: Week 1: SLP Short Term Goal 1 (Week 1): Pt will sustain attention to a functional task for ~ 10 minutes with Min A question and verbal cues for redirection SLP Short Term Goal 1 - Progress (Week 1): Progressing toward goal SLP Short Term Goal 2 (Week 1): Pt will initiate functional tasks with Mod A verbal and questioning cues. SLP Short Term Goal 2 - Progress (Week 1): Progressing toward goal SLP Short Term Goal 3 (Week 1): Pt will demonstrate functional problem solving during basic and familiar tasks with Mod A verbal and visual cues. SLP Short Term Goal 3 - Progress (Week 1): Progressing toward goal SLP Short Term Goal 4 (Week 1): Pt will utilize external aids for orientaton to place, time and situation with Mod A verbal and questioning cues.  SLP Short Term Goal 5 (Week 1): Pt will demonstrate use of strategies to increase word-finding during functional conversation with Mod A verbal and question cues.  SLP Short Term Goal 5 - Progress (Week 1): Progressing toward goal  Skilled Therapeutic Interventions: Session focused on identifying current med schedule and purpose of each medication, addressing goals for attention and problem-solving in particular.  Pt voicing concern re: multitude of pills she is taking and was seeking clarification.  After reviewing each of her current meds, pt able to recall purpose of 4/7.  Problem-solved through med schedule with moderate verbal cues.  Min redirection required to sustain attention to activity. Min cues to refer to handout when struggling to retrieve names/function of meds.  Pt continued to voice concern over the necessity of some of her medications.  Encouraged her to speak with RN/MD to clarify questions.   FIM:   Comprehension Comprehension: 3-Understands basic 50 - 74% of the time/requires cueing 25 - 50%  of the time Expression Expression: 4-Expresses basic 75 - 89% of the time/requires cueing 10 - 24% of the time. Needs helper to occlude trach/needs to repeat words. Social Interaction Social Interaction: 5-Interacts appropriately 90% of the time - Needs monitoring or encouragement for participation or interaction. Problem Solving Problem Solving: 2-Solves basic 25 - 49% of the time - needs direction more than half the time to initiate, plan or complete simple activities Memory Memory: 2-Recognizes or recalls 25 - 49% of the time/requires cueing 51 - 75% of the time  Pain Pain Assessment Pain Assessment: No/denies pain  Therapy/Group: Individual Therapy Tery Hoeger L. Samson Frederic, [place] CCC/SLP Pager [phone]   Blenda Mounts Laurice 06/09/2012, 12:10 PM

## 2012-06-09 NOTE — Progress Notes (Signed)
Physical Therapy Session Note  Patient Details  Name: Meagan [name] MRN: [id number] Date of Birth: 1935/09/24  Today's Date: 06/09/2012 Time: 1102-1157 Time Calculation (min): 55 min  Short Term Goals: Week 1:  PT Short Term Goal 1 (Week 1): Pt will perform sit <> stand with moderate assist consistently.  PT Short Term Goal 1 - Progress (Week 1): Progressing toward goal PT Short Term Goal 2 (Week 1): Pt will transfer bed <> wheelchair with moderate assist consistently. PT Short Term Goal 2 - Progress (Week 1): Progressing toward goal PT Short Term Goal 3 (Week 1): Pt will ambulate 30' with moderate assist and RW.  PT Short Term Goal 3 - Progress (Week 1): Progressing toward goal  Skilled Therapeutic Interventions/Progress Updates:   Pt with continued confusion. Requires max verbal cues and moderate assist to complete wheelchair mobility x 50'. Pt requires same verbal and tactile cues with each turn, unable to remember instruction from a few minutes prior. Standing balance activity with one UE support focusing on reaching forwards to promote anterior weight shift and righting reactions, min assist. Gait training x 130', 100' with RW and up to moderate assist with 3 losses of balance. Pt tends to get hips and feet outside of RW with turns. Sit <> stand reinforcing yesterday's session focused on mechanics however pt with ~10% carryover. Pt has tight hip flexors, will attempt to address in further sessions.  Pt with impaired processing of simple tasks and decreased recall of instruction intra and inter session.    Second Session Skilled Therapeutic Interventions/Progress Updates:  Time:  [phone] Time Calculation (min): 43 min Pain: 0/10 Gait training with RW with min assist max verbal cues for safe negotiation with turning with RW. Pt very unsafe with turning to sit on mat, places walker far out in front/to side prior to fully turning to sit, PT needed to finally assist pt with safe  placement as she was unable to follow verbal cues. Gait training x 200', 75' without device, pt needs nearly constant min assist, up to mod assist and had slight increase in losses of balance compared to with RW however has decreased safety risk due to RW malplacement. Pt had to use bathroom during session, practiced in handicap accessible bathroom, pt needs mod assist to sit <> stand from elevated toilet with hand rails, max verbal cues for safety and sequencing throughout.   Pt's sentences don't make sense at times throughout session - difficulty with word finding. When back in room pt stating she needed the trash can in front of her since she "goes" so much, explained that she is not to urinate in the trash can, pt verbalized understanding. Pt left in wheelchiar with quick release belt.   Therapy Documentation Precautions:  Precautions Precautions: Fall Restrictions Weight Bearing Restrictions: No Pain: Pain Assessment Pain Assessment: No/denies pain either session Locomotion : Ambulation Ambulation/Gait Assistance: 3: Mod assist Wheelchair Mobility Distance: 72'   See FIM for current functional status  Therapy/Group: Individual Therapy both sessions  Wilhemina Bonito 06/09/2012, 12:06 PM

## 2012-06-10 LAB — CULTURE, BLOOD (ROUTINE X 2): Culture: NO GROWTH

## 2012-06-10 NOTE — Progress Notes (Signed)
Physical Therapy Note  Patient Details  Name: Meagan [name] MRN: [id number] Date of Birth: 01-23-1935 Today's Date: 06/10/2012  1300-1322 (22 minutes) individual (pt missed 8 minutes due to bathroom needs) Pain: no complaint of pain Focus of treatment: Gait training/endurance with RW Treatment: Gait 120 feet X 2 RW min assist with mod/max vcs to use AD safely. Pt attempts to ambulate without AD at times. Sit to stand with decreased forward trunk lean requiring several attempts and min/mod assist .   Caison Hearn,JIM 06/10/2012, 1:24 PM

## 2012-06-10 NOTE — Progress Notes (Signed)
Occupational Therapy Session Note  Patient Details  Name: Meagan [name] MRN: [id number] Date of Birth: February 23, 1935  Today's Date: 06/10/2012 Time: 0803-0900  1st sessopm Time Calculation (min): 57 min  Time: 1430-1515  (45 min) 2nd session Time Calculation (min): 45  min   Short Term Goals: Week 1:  OT Short Term Goal 1 (Week 1): STG=LTG of overall Supervision - Min assist  Skilled Therapeutic Interventions/Progress Updates:      1st session:  Performed functional mobility, dynamic sitting balance, standing activity, during bathing and dressing at sink level.  Pt. Ambulated to chest of drawers with RW and retrieved clothes with minimal assist.  She  Stood with close supervision for 6 minutes to wash body.  Pt. Needed assistance with motor planning donning her shirt.            2nd session:  Pt. Stood and engaged in Set designer game for 10 minutes.  Ambulated to the clothesline to hand out clothes with minimal to supervision for balance and clipping clothes pins to line.  Pt. Exhibited some cognitive issues with checkers and following directions.  Daughter, [name] present during session and was pleased with her mothers progress.   Therapy Documentation Precautions:  Precautions Precautions: Fall Restrictions Weight Bearing Restrictions: No    Pain:  none    See FIM for current functional status  Therapy/Group: Individual Therapy  [place], Folds 06/10/2012, 8:41 AM

## 2012-06-10 NOTE — Progress Notes (Signed)
Speech Language Pathology Daily Session Note  Patient Details  Name: Meagan [name] MRN: [id number] Date of Birth: 10-Jun-1935  Today's Date: 06/10/2012 Time: 1005-1030 Time Calculation (min): 25 min  Short Term Goals: Week 1: SLP Short Term Goal 1 (Week 1): Pt will sustain attention to a functional task for ~ 10 minutes with Min A question and verbal cues for redirection SLP Short Term Goal 1 - Progress (Week 1): Progressing toward goal SLP Short Term Goal 2 (Week 1): Pt will initiate functional tasks with Mod A verbal and questioning cues. SLP Short Term Goal 2 - Progress (Week 1): Progressing toward goal SLP Short Term Goal 3 (Week 1): Pt will demonstrate functional problem solving during basic and familiar tasks with Mod A verbal and visual cues. SLP Short Term Goal 3 - Progress (Week 1): Progressing toward goal SLP Short Term Goal 4 (Week 1): Pt will utilize external aids for orientaton to place, time and situation with Mod A verbal and questioning cues.  SLP Short Term Goal 5 (Week 1): Pt will demonstrate use of strategies to increase word-finding during functional conversation with Mod A verbal and question cues.  SLP Short Term Goal 5 - Progress (Week 1): Progressing toward goal  Skilled Therapeutic Interventions: Treatment session focused on cognitive-linguistic treatment; RN present for medication administration with patient refusing to take 2/5 medications.  SLP facilitated session by educating patient on rationale for current medications and though she may not currently have symptoms, the medication's purpose is to prevent the symptoms of a UTI from occuring again.  During administration on medication patient reported difficulty swallowing large pills and SLP suspects this difficulty also impacts her desire/willingness to take medications.  SLP educated both RN and patient on strategies such as puree.  SLP also educated patient on dysphagia as a potential deficit that occurs with  Parkinson's.  Daughter present for end of session and patient more agreeable to take medication with encouragement from her, so SLP notified RN.    FIM:  Comprehension Comprehension Mode: Auditory Comprehension: 3-Understands basic 50 - 74% of the time/requires cueing 25 - 50%  of the time Expression Expression Mode: Verbal Expression: 4-Expresses basic 75 - 89% of the time/requires cueing 10 - 24% of the time. Needs helper to occlude trach/needs to repeat words. Social Interaction Social Interaction: 5-Interacts appropriately 90% of the time - Needs monitoring or encouragement for participation or interaction. Problem Solving Problem Solving: 2-Solves basic 25 - 49% of the time - needs direction more than half the time to initiate, plan or complete simple activities Memory Memory: 2-Recognizes or recalls 25 - 49% of the time/requires cueing 51 - 75% of the time  Pain Pain Assessment Pain Assessment: No/denies pain Pain Score: 0-No pain  Therapy/Group: Individual Therapy  Charlane Ferretti., CCC-SLP [phone]  Meagan [name] 06/10/2012, 11:31 AM

## 2012-06-10 NOTE — Progress Notes (Signed)
Physical Therapy Note  Patient Details  Name: Meagan [name] MRN: [id number] Date of Birth: 30-Mar-1935 Today's Date: 06/10/2012  4098-1191 (55 minutes) individual Pain: no complaint of pain Focus of treatment: Therapeutic exercises to improve activity tolerance; therapeutic activity focused on improving standing tolerance/balance; gait training with RW Treatment: Transfers SPT wc ><Nustep min/ mod assist with max vcs for hand placement sit to stand; Nustep Level 3 X 5 minutes for general activity tolerance; Standing - sit to stand min assist for safety; stands with bilateral hips flexed; shoulder arc in standing X 2 minutes; reaching with trunk rotation arms length min assist; standing trunk extension using basketball X 10; gait- 160 feet X 1 RW min assist with instructional cues for hand placement sit to stand.   Carys Malina,JIM 06/10/2012, 9:16 AM

## 2012-06-10 NOTE — Progress Notes (Signed)
Patient ID: Meagan [name], female   DOB: September 18, 1935, 77 y.o.   MRN: [id number] Patient ID: Meagan [name], female   DOB: 17-Aug-1935, 77 y.o.   MRN: [id number] Patient ID: Meagan [name], female   DOB: 1935-01-18, 77 y.o.   MRN: [id number] Subjective/Complaints: 7/6. Comfortable today without new complaints;  Potassium 3.0- received K supplements.  Chest- clear; CV- normal heart sounds; Abd- benign; Extr- no edema .Marland KitchenA 12 point review of systems has been performed and if not noted above is otherwise negative. CBG (last 3)  No results found for this basename: GLUCAP:3 in the last 72 hours   BP Readings from Last 3 Encounters:  06/10/12 154/82  06/06/12 147/56  02/08/12 150/80    Objective: Vital Signs: Blood pressure 154/82, pulse 70, temperature 98.5 F (36.9 C), temperature source Oral, resp. rate 18, height 5\' 11"  (1.803 m), weight 79.561 kg (175 lb 6.4 oz), SpO2 98.00%. No results found.  Basename 06/08/12 0635  WBC 4.9  HGB 10.7*  HCT 31.9*  PLT 210    Basename 06/08/12 0635  NA 145  K 3.0*  CL 111  CO2 26  GLUCOSE 95  BUN 6  CREATININE 0.49*  CALCIUM 8.5   CBG (last 3)  No results found for this basename: GLUCAP:3 in the last 72 hours  Wt Readings from Last 3 Encounters:  06/07/12 79.561 kg (175 lb 6.4 oz)  06/07/12 85 kg (187 lb 6.3 oz)  02/08/12 80.287 kg (177 lb)    Physical Exam:  General appearance: alert, cooperative and mild distress, more relaxed today Head: Normocephalic, without obvious abnormality, atraumatic Eyes: conjunctivae/corneas clear. PERRL, EOM's intact. Fundi benign. Ears: normal TM's and external ear canals both ears Nose: Nares normal. Septum midline. Mucosa normal. No drainage or sinus tenderness. Throat: lips, mucosa, and tongue normal; teeth and gums normal Neck: no adenopathy, no carotid bruit, no JVD, supple, symmetrical, trachea midline and thyroid not enlarged, symmetric, no tenderness/mass/nodules Back: symmetric, no  curvature. ROM normal. No CVA tenderness. Resp: clear to auscultation bilaterally Cardio: regular rate and rhythm, S1, S2 normal, no murmur, click, rub or gallop GI: soft, non-tender; bowel sounds normal; no masses,  no organomegaly Extremities: extremities normal, atraumatic, no cyanosis or edema Pulses: 2+ and symmetric Skin: Skin color, texture, turgor normal. No rashes or lesions Neurologic: Pt with ongoing pill rolling and intentional tremors. Moves all 4's with proximal weakness noted. No gross sensory deficits, CN exam normal. Oriented to person. Incision/Wound:     Assessment/Plan: 1. Functional deficits secondary to deconditioning, parkinson's pseudoexacerbation which require 3+ hours per day of interdisciplinary therapy in a comprehensive inpatient rehab setting. Physiatrist is providing close team supervision and 24 hour management of active medical problems listed below. Physiatrist and rehab team continue to assess barriers to discharge/monitor patient progress toward functional and medical goals. FIM: FIM - Bathing Bathing Steps Patient Completed: Chest;Right Arm;Left Arm;Abdomen;Front perineal area;Buttocks;Right upper leg;Left upper leg;Right lower leg (including foot);Left lower leg (including foot) Bathing: 4: Steadying assist  FIM - Upper Body Dressing/Undressing Upper body dressing/undressing steps patient completed: Thread/unthread right sleeve of pullover shirt/dresss;Thread/unthread left sleeve of pullover shirt/dress;Put head through opening of pull over shirt/dress;Pull shirt over trunk Upper body dressing/undressing: 4: Min-Patient completed 75 plus % of tasks FIM - Lower Body Dressing/Undressing Lower body dressing/undressing steps patient completed: Thread/unthread right pants leg;Thread/unthread left pants leg;Pull pants up/down;Thread/unthread right underwear leg;Thread/unthread left underwear leg;Pull underwear up/down;Don/Doff right sock;Don/Doff left sock  (shoes too tight) Lower body dressing/undressing: 4: Min-Patient completed 75 plus %  of tasks  FIM - Toileting Toileting steps completed by patient: Performs perineal hygiene Toileting Assistive Devices: Grab bar or rail for support Toileting: 2: Max-Patient completed 1 of 3 steps  FIM - Diplomatic Services operational officer Devices: Elevated toilet seat;Grab bars Toilet Transfers: 3-To toilet/BSC: Mod A (lift or lower assist);3-From toilet/BSC: Mod A (lift or lower assist)  FIM - Bed/Chair Transfer Bed/Chair Transfer: 4: Supine > Sit: Min A (steadying Pt. > 75%/lift 1 leg);1: Two helpers  FIM - Locomotion: Wheelchair Distance: 50' Locomotion: Wheelchair: 2: Travels 50 - 149 ft with moderate assistance (Pt: 50 - 74%) FIM - Locomotion: Ambulation Locomotion: Ambulation Assistive Devices: Designer, industrial/product Ambulation/Gait Assistance: 3: Mod assist Locomotion: Ambulation: 3: Travels 150 ft or more with moderate assistance (Pt: 50 - 74%)  Comprehension Comprehension Mode: Auditory Comprehension: 3-Understands basic 50 - 74% of the time/requires cueing 25 - 50%  of the time  Expression Expression Mode: Verbal Expression: 4-Expresses basic 75 - 89% of the time/requires cueing 10 - 24% of the time. Needs helper to occlude trach/needs to repeat words.  Social Interaction Social Interaction: 5-Interacts appropriately 90% of the time - Needs monitoring or encouragement for participation or interaction.  Problem Solving Problem Solving: 2-Solves basic 25 - 49% of the time - needs direction more than half the time to initiate, plan or complete simple activities  Memory Memory: 2-Recognizes or recalls 25 - 49% of the time/requires cueing 51 - 75% of the time   MEDICAL PROBLEM LIST  1. DVT Prophylaxis/Anticoagulation: Pharmaceutical: Lovenox  2. Pain Management: N/A  3. Mood: Monitor for now. Confusion feeds her anxiety however. More relaxed today 4. Neuropsych: This patient is  not capable of making decisions on his/her own behalf. Likely baseline dementia.  5. Parkinson's disease: Continue sinemet IR tid  6. E coli/Proteus UTI: on cipro thru 7/6.  Patient remains afebrile. Follow voiding habits.  7. HTN: New diagnosis. Start low dose lisinopril and titrate slowly as needed. Monitor with bid checks 8. Anemia: MVI 9. Mild hypokalemia: supplement and recheck monday   LOS (Days) 3 A FACE TO FACE EVALUATION WAS PERFORMED  Rogelia Boga 06/10/2012, 8:19 AM

## 2012-06-11 NOTE — Progress Notes (Signed)
Physical Therapy Session Note  Patient Details  Name: Meagan [name] MRN: [id number] Date of Birth: 09/28/1935  Today's Date: 06/11/2012 Time: 1040-1105 Time Calculation (min): 25 min  Short Term Goals: Week 1:  PT Short Term Goal 1 (Week 1): Pt will perform sit <> stand with moderate assist consistently.  PT Short Term Goal 1 - Progress (Week 1): Progressing toward goal PT Short Term Goal 2 (Week 1): Pt will transfer bed <> wheelchair with moderate assist consistently. PT Short Term Goal 2 - Progress (Week 1): Progressing toward goal PT Short Term Goal 3 (Week 1): Pt will ambulate 30' with moderate assist and RW.  PT Short Term Goal 3 - Progress (Week 1): Progressing toward goal Week 2:     Skilled Therapeutic Interventions/Progress Updates:   Therapeutic exercise performed with LE to increase strength for functional mobility: 1 x 10 bil hip flexion, knee extension, ankle pumps, ankle circles, resisted hip abduction/adduction; bil LE hamstring and heel cord stretching.    Mod assist l and R wt shifting to facilitate moving hips forward in w/c.  Sit>< stand x 3 for generalized strengthening, focusing on wt shifting.  Gait training x 5', interrupted by pt stating she was urinating.   W/c> bed stand step transfer using RW,  Side stepping to R for better position before sitting.  Sit> supine with supervision.  Nursing notified of bladder accident.     Therapy Documentation Precautions:  Precautions Precautions: Fall Restrictions Weight Bearing Restrictions: No General:   Vital Signs:   Pain: Pain Assessment Pain Assessment: No/denies pain      See FIM for current functional status  Therapy/Group: Individual Therapy  [name] 06/11/2012, 12:33 PM

## 2012-06-11 NOTE — Progress Notes (Signed)
Patient ID: Meagan [name], female   DOB: 08-19-1935, 77 y.o.   MRN: [id number] Patient ID: Meagan [name], female   DOB: 08/18/1935, 77 y.o.   MRN: [id number] Patient ID: Meagan [name], female   DOB: 08-03-1935, 77 y.o.   MRN: [id number] Patient ID: Meagan [name], female   DOB: 05/01/1935, 77 y.o.   MRN: [id number] Subjective/Complaints: 7/7. Comfortable today without new complaints;  Potassium 3.0 earlier- received K supplements. Exam unchanged Chest- clear; CV- normal heart sounds; Abd- benign; Extr- no edema .Marland KitchenA 12 point review of systems has been performed and if not noted above is otherwise negative. CBG (last 3)  No results found for this basename: GLUCAP:3 in the last 72 hours   BP Readings from Last 3 Encounters:  06/11/12 165/76  06/06/12 147/56  02/08/12 150/80    Objective: Vital Signs: Blood pressure 165/76, pulse 66, temperature 98.9 F (37.2 C), temperature source Oral, resp. rate 18, height 5\' 11"  (1.803 m), weight 79.561 kg (175 lb 6.4 oz), SpO2 97.00%. No results found. No results found for this basename: WBC:2,HGB:2,HCT:2,PLT:2 in the last 72 hours No results found for this basename: NA:2,K:2,CL:2,CO2:2,GLUCOSE:2,BUN:2,CREATININE:2,CALCIUM:2 in the last 72 hours CBG (last 3)  No results found for this basename: GLUCAP:3 in the last 72 hours  Wt Readings from Last 3 Encounters:  06/07/12 79.561 kg (175 lb 6.4 oz)  06/07/12 85 kg (187 lb 6.3 oz)  02/08/12 80.287 kg (177 lb)    Physical Exam:  General appearance: alert, cooperative and mild distress, more relaxed today Head: Normocephalic, without obvious abnormality, atraumatic Eyes: conjunctivae/corneas clear. PERRL, EOM's intact. Fundi benign. Ears: normal TM's and external ear canals both ears Nose: Nares normal. Septum midline. Mucosa normal. No drainage or sinus tenderness. Throat: lips, mucosa, and tongue normal; teeth and gums normal Neck: no adenopathy, no carotid bruit, no JVD, supple,  symmetrical, trachea midline and thyroid not enlarged, symmetric, no tenderness/mass/nodules Back: symmetric, no curvature. ROM normal. No CVA tenderness. Resp: clear to auscultation bilaterally Cardio: regular rate and rhythm, S1, S2 normal, no murmur, click, rub or gallop GI: soft, non-tender; bowel sounds normal; no masses,  no organomegaly Extremities: extremities normal, atraumatic, no cyanosis or edema Pulses: 2+ and symmetric Skin: Skin color, texture, turgor normal. No rashes or lesions Neurologic: Pt with ongoing pill rolling and intentional tremors. Moves all 4's with proximal weakness noted. No gross sensory deficits, CN exam normal. Oriented to person. Incision/Wound:     Assessment/Plan: 1. Functional deficits secondary to deconditioning, parkinson's pseudoexacerbation which require 3+ hours per day of interdisciplinary therapy in a comprehensive inpatient rehab setting. Physiatrist is providing close team supervision and 24 hour management of active medical problems listed below. Physiatrist and rehab team continue to assess barriers to discharge/monitor patient progress toward functional and medical goals. FIM: FIM - Bathing Bathing Steps Patient Completed: Chest;Right Arm;Left Arm;Abdomen;Front perineal area;Buttocks;Right upper leg;Left upper leg;Right lower leg (including foot);Left lower leg (including foot) Bathing: 4: Steadying assist  FIM - Upper Body Dressing/Undressing Upper body dressing/undressing steps patient completed: Thread/unthread right sleeve of pullover shirt/dresss;Thread/unthread left sleeve of pullover shirt/dress;Put head through opening of pull over shirt/dress;Pull shirt over trunk Upper body dressing/undressing: 4: Min-Patient completed 75 plus % of tasks FIM - Lower Body Dressing/Undressing Lower body dressing/undressing steps patient completed: Thread/unthread right underwear leg;Thread/unthread left underwear leg;Pull underwear  up/down;Thread/unthread right pants leg;Thread/unthread left pants leg;Pull pants up/down;Don/Doff left sock;Don/Doff right sock (needed assist buttoning shirt) Lower body dressing/undressing: 4: Min-Patient completed 75 plus % of tasks  FIM - Toileting  Toileting steps completed by patient: Performs perineal hygiene Toileting Assistive Devices: Grab bar or rail for support Toileting: 2: Max-Patient completed 1 of 3 steps  FIM - Diplomatic Services operational officer Devices: Elevated toilet seat;Grab bars Toilet Transfers: 3-To toilet/BSC: Mod A (lift or lower assist);3-From toilet/BSC: Mod A (lift or lower assist)  FIM - Bed/Chair Transfer Bed/Chair Transfer: 4: Supine > Sit: Min A (steadying Pt. > 75%/lift 1 leg);1: Two helpers  FIM - Locomotion: Wheelchair Distance: 50' Locomotion: Wheelchair: 2: Travels 50 - 149 ft with moderate assistance (Pt: 50 - 74%) FIM - Locomotion: Ambulation Locomotion: Ambulation Assistive Devices: Designer, industrial/product Ambulation/Gait Assistance: 4: Min guard Locomotion: Ambulation: 4: Travels 150 ft or more with minimal assistance (Pt.>75%)  Comprehension Comprehension Mode: Auditory Comprehension: 3-Understands basic 50 - 74% of the time/requires cueing 25 - 50%  of the time  Expression Expression Mode: Verbal Expression: 4-Expresses basic 75 - 89% of the time/requires cueing 10 - 24% of the time. Needs helper to occlude trach/needs to repeat words.  Social Interaction Social Interaction: 5-Interacts appropriately 90% of the time - Needs monitoring or encouragement for participation or interaction.  Problem Solving Problem Solving: 2-Solves basic 25 - 49% of the time - needs direction more than half the time to initiate, plan or complete simple activities  Memory Memory: 2-Recognizes or recalls 25 - 49% of the time/requires cueing 51 - 75% of the time   MEDICAL PROBLEM LIST  1. DVT Prophylaxis/Anticoagulation: Pharmaceutical: Lovenox  2.  [name] Management: N/A  3. Mood: Monitor for now. Confusion feeds her anxiety however. More relaxed today 4. Neuropsych: This patient is not capable of making decisions on his/her own behalf. Likely baseline dementia.  5. Parkinson's disease: Continue sinemet IR tid  6. E coli/Proteus UTI: on cipro thru 7/6.  Patient remains afebrile. Follow voiding habits.  7. HTN: New diagnosis. Start low dose lisinopril and titrate slowly as needed. Monitor with bid checks 8. Anemia: MVI 9. Mild hypokalemia: supplement and recheck monday   LOS (Days) 4 A FACE TO FACE EVALUATION WAS PERFORMED  Rogelia Boga 06/11/2012, 8:24 AM

## 2012-06-12 NOTE — Progress Notes (Signed)
.no new issues over the weekend. Daughter doesn't understand why she is taking lovenox. .A 12 point review of systems has been performed and if not noted above is otherwise negative. CBG (last 3)  No results found for this basename: GLUCAP:3 in the last 72 hours   BP Readings from Last 3 Encounters:  06/12/12 128/62  06/06/12 147/56  02/08/12 150/80    Objective: Vital Signs: Blood pressure 128/62, pulse 100, temperature 98.2 F (36.8 C), temperature source Oral, resp. rate 18, height 5\' 11"  (1.803 m), weight 79.561 kg (175 lb 6.4 oz), SpO2 98.00%. No results found. No results found for this basename: WBC:2,HGB:2,HCT:2,PLT:2 in the last 72 hours No results found for this basename: NA:2,K:2,CL:2,CO2:2,GLUCOSE:2,BUN:2,CREATININE:2,CALCIUM:2 in the last 72 hours CBG (last 3)  No results found for this basename: GLUCAP:3 in the last 72 hours  Wt Readings from Last 3 Encounters:  06/07/12 79.561 kg (175 lb 6.4 oz)  06/07/12 85 kg (187 lb 6.3 oz)  02/08/12 80.287 kg (177 lb)    Physical Exam:  General appearance: alert, cooperative and mild distress, more relaxed today Head: Normocephalic, without obvious abnormality, atraumatic Eyes: conjunctivae/corneas clear. PERRL, EOM's intact. Fundi benign. Ears: normal TM's and external ear canals both ears Nose: Nares normal. Septum midline. Mucosa normal. No drainage or sinus tenderness. Throat: lips, mucosa, and tongue normal; teeth and gums normal Neck: no adenopathy, no carotid bruit, no JVD, supple, symmetrical, trachea midline and thyroid not enlarged, symmetric, no tenderness/mass/nodules Back: symmetric, no curvature. ROM normal. No CVA tenderness. Resp: clear to auscultation bilaterally Cardio: regular rate and rhythm, S1, S2 normal, no murmur, click, rub or gallop GI: soft, non-tender; bowel sounds normal; no masses,  no organomegaly Extremities: extremities normal, atraumatic, no cyanosis or edema Pulses: 2+ and symmetric Skin:  Skin color, texture, turgor normal. No rashes or lesions Neurologic: Pt with ongoing pill rolling and intentional tremors. Moves all 4's with proximal weakness noted. No gross sensory deficits, CN exam normal. Oriented to person. Incision/Wound:     Assessment/Plan: 1. Functional deficits secondary to deconditioning, parkinson's pseudoexacerbation which require 3+ hours per day of interdisciplinary therapy in a comprehensive inpatient rehab setting. Physiatrist is providing close team supervision and 24 hour management of active medical problems listed below. Physiatrist and rehab team continue to assess barriers to discharge/monitor patient progress toward functional and medical goals. FIM: FIM - Bathing Bathing Steps Patient Completed: Chest;Right Arm;Left Arm;Abdomen;Front perineal area;Buttocks;Right upper leg;Left upper leg;Right lower leg (including foot);Left lower leg (including foot) Bathing: 4: Steadying assist  FIM - Upper Body Dressing/Undressing Upper body dressing/undressing steps patient completed: Thread/unthread right sleeve of pullover shirt/dresss;Thread/unthread left sleeve of pullover shirt/dress;Put head through opening of pull over shirt/dress;Pull shirt over trunk Upper body dressing/undressing: 4: Min-Patient completed 75 plus % of tasks FIM - Lower Body Dressing/Undressing Lower body dressing/undressing steps patient completed: Thread/unthread right underwear leg;Thread/unthread left underwear leg;Pull underwear up/down;Thread/unthread right pants leg;Thread/unthread left pants leg;Pull pants up/down;Don/Doff left sock;Don/Doff right sock (needed assist buttoning shirt) Lower body dressing/undressing: 4: Min-Patient completed 75 plus % of tasks  FIM - Toileting Toileting steps completed by patient: Performs perineal hygiene Toileting Assistive Devices: Grab bar or rail for support Toileting: 2: Max-Patient completed 1 of 3 steps  FIM - Quarry manager Devices: Elevated toilet seat;Grab bars Toilet Transfers: 3-To toilet/BSC: Mod A (lift or lower assist);3-From toilet/BSC: Mod A (lift or lower assist)  FIM - Bed/Chair Transfer Bed/Chair Transfer: 5: Sit > Supine: Supervision (verbal cues/safety issues);4: Chair or W/C >  Bed: Min A (steadying Pt. > 75%)  FIM - Locomotion: Wheelchair Distance: 50' Locomotion: Wheelchair: 0: Activity did not occur FIM - Locomotion: Ambulation Locomotion: Ambulation Assistive Devices: Designer, industrial/product Ambulation/Gait Assistance: 4: Min guard Locomotion: Ambulation: 1: Travels less than 50 ft with minimal assistance (Pt.>75%)  Comprehension Comprehension Mode: Auditory Comprehension: 3-Understands basic 50 - 74% of the time/requires cueing 25 - 50%  of the time  Expression Expression Mode: Verbal Expression: 4-Expresses basic 75 - 89% of the time/requires cueing 10 - 24% of the time. Needs helper to occlude trach/needs to repeat words.  Social Interaction Social Interaction: 5-Interacts appropriately 90% of the time - Needs monitoring or encouragement for participation or interaction.  Problem Solving Problem Solving: 2-Solves basic 25 - 49% of the time - needs direction more than half the time to initiate, plan or complete simple activities  Memory Memory: 2-Recognizes or recalls 25 - 49% of the time/requires cueing 51 - 75% of the time   MEDICAL PROBLEM LIST  1. DVT Prophylaxis/Anticoagulation: Pharmaceutical: Lovenox - daughter needs education regarding need for lovenox 2. Pain Management: N/A  3. Mood: Monitor for now. Confusion feeds her anxiety however. More relaxed today 4. Neuropsych: This patient is not capable of making decisions on his/her own behalf. Likely baseline dementia.  5. Parkinson's disease: Continue sinemet IR tid  6. E coli/Proteus UTI: cipro completed.  Patient remains afebrile. Follow voiding habits.  7. HTN: New diagnosis. Start low dose lisinopril  and titrate slowly as needed. Monitor with bid checks 8. Anemia: MVI 9. Mild hypokalemia: supplement and recheck today   LOS (Days) 5 A FACE TO FACE EVALUATION WAS PERFORMED  SWARTZ,ZACHARY T 06/12/2012, 6:51 AM

## 2012-06-12 NOTE — Progress Notes (Signed)
Physical Therapy Session Note  Patient Details  Name: Keileigh Vahey MRN: [id number] Date of Birth: 13-Apr-1935  Today's Date: 06/12/2012 Time: 1115-1201 Time Calculation (min): 46 min  Short Term Goals: Week 1:  PT Short Term Goal 1 (Week 1): Pt will perform sit <> stand with moderate assist consistently.  PT Short Term Goal 1 - Progress (Week 1): Progressing toward goal PT Short Term Goal 2 (Week 1): Pt will transfer bed <> wheelchair with moderate assist consistently. PT Short Term Goal 2 - Progress (Week 1): Progressing toward goal PT Short Term Goal 3 (Week 1): Pt will ambulate 30' with moderate assist and RW.  PT Short Term Goal 3 - Progress (Week 1): Progressing toward goal  Skilled Therapeutic Interventions/Progress Updates:    Pt making good mobility gains however continues to have limited carry over for safety awareness. Will need 24 hour supervision at D/C. All sit <> stands performed with min-guard assist, pt frequently needs multiple attempts and step by step verbal cues for sequencing and safe of UE placement. Gait training in controlled environment with RW x 200', supervision for safety. Gait training including side stepping/ backwards walking in home environment over carpeted and tile surfaces, min assist and max verbal cues for RW safety. Throughout session practiced problem solving and increasing independence/safety with ADLs. Bed mobility on regular bed at supervision level.    Therapy Documentation Precautions:  Precautions Precautions: Fall Restrictions Weight Bearing Restrictions: No Pain: Pain Assessment Pain Assessment: No/denies pain  See FIM for current functional status  Therapy/Group: Individual Therapy  Wilhemina Bonito 06/12/2012, 12:15 PM

## 2012-06-12 NOTE — Progress Notes (Signed)
Physical Therapy Session Note  Patient Details  Name: Meagan [name] MRN: [id number] Date of Birth: Apr 09, 1935  Today's Date: 06/12/2012 Time: 4098-1191 Time Calculation (min): 60 min  Short Term Goals: Week 1:  PT Short Term Goal 1 (Week 1): Pt will perform sit <> stand with moderate assist consistently.  PT Short Term Goal 1 - Progress (Week 1): Progressing toward goal PT Short Term Goal 2 (Week 1): Pt will transfer bed <> wheelchair with moderate assist consistently. PT Short Term Goal 2 - Progress (Week 1): Progressing toward goal PT Short Term Goal 3 (Week 1): Pt will ambulate 30' with moderate assist and RW.  PT Short Term Goal 3 - Progress (Week 1): Progressing toward goal  Skilled Therapeutic Interventions/Progress Updates:    Treatment somewhat limited by no carryover. Pt can not remember where she is going or task at hand for instance she will keep turning in a circle or keep walking backwards until asked "do you remember where you are going".  Ambulated to bathroom with RW, min-guard assist and max verbal cues for negotiation of RW. Moderate assist for stand from toilet. Performed gait in controlled environment 2 x 100' with RW. Max verbal cues with every sit <> stand for safe hand positioning. Prone hip flexor stretch on mat. Performed dynamic balance activity reaching across midline to various heights without UE support. Practiced RW negotiation around cones and over small board. Pt required min assist and repeated verbal cues to "keep feet inside the walker". Pt unable to carryover 90% of cues for obstacle course performed 4x.   Suspect pt will be safest without RW, will continue to assess.  Therapy Documentation Precautions:  Precautions Precautions: Fall Restrictions Weight Bearing Restrictions: No Pain: Pain Assessment Pain Assessment: No/denies pain  See FIM for current functional status  Therapy/Group: Individual Therapy  Wilhemina Bonito 06/12/2012,  4:37 PM

## 2012-06-12 NOTE — Progress Notes (Signed)
Speech Language Pathology Daily Session Note  Patient Details  Name: Meagan [name] MRN: [id number] Date of Birth: 1935-05-07  Today's Date: 06/12/2012 Time: 0800-0830 Time Calculation (min): 30 min  Short Term Goals: Week 1: SLP Short Term Goal 1 (Week 1): Pt will sustain attention to a functional task for ~ 10 minutes with Min A question and verbal cues for redirection SLP Short Term Goal 1 - Progress (Week 1): Progressing toward goal SLP Short Term Goal 2 (Week 1): Pt will initiate functional tasks with Mod A verbal and questioning cues. SLP Short Term Goal 2 - Progress (Week 1): Progressing toward goal SLP Short Term Goal 3 (Week 1): Pt will demonstrate functional problem solving during basic and familiar tasks with Mod A verbal and visual cues. SLP Short Term Goal 3 - Progress (Week 1): Progressing toward goal SLP Short Term Goal 4 (Week 1): Pt will utilize external aids for orientaton to place, time and situation with Mod A verbal and questioning cues.  SLP Short Term Goal 5 (Week 1): Pt will demonstrate use of strategies to increase word-finding during functional conversation with Mod A verbal and question cues.  SLP Short Term Goal 5 - Progress (Week 1): Progressing toward goal  Skilled Therapeutic Interventions: Treatment session focused on cognitive-linguistic treatment; Pt required Mod A questioning cues to utilize schedule to appropriately read her daily therapy schedule and anticipate next therapy session. Pt required Max A semantic and questioning cues for intellectual awareness into cognitive and physical deficits and overall safety awareness. RN administering medications but unable to recall function of medications. Pt demonstrated increased thought organization and overall supervision verbal cues required for increased word finding.    FIM:  Comprehension Comprehension Mode: Auditory Comprehension: 3-Understands basic 50 - 74% of the time/requires cueing 25 - 50%  of the  time Expression Expression Mode: Verbal Expression: 4-Expresses basic 75 - 89% of the time/requires cueing 10 - 24% of the time. Needs helper to occlude trach/needs to repeat words. Social Interaction Social Interaction: 5-Interacts appropriately 90% of the time - Needs monitoring or encouragement for participation or interaction. Problem Solving Problem Solving: 2-Solves basic 25 - 49% of the time - needs direction more than half the time to initiate, plan or complete simple activities Memory Memory: 2-Recognizes or recalls 25 - 49% of the time/requires cueing 51 - 75% of the time  Pain Pain Assessment Pain Assessment: No/denies pain  Therapy/Group: Individual Therapy  Trayce Maino 06/12/2012, 1:19 PM

## 2012-06-12 NOTE — Progress Notes (Signed)
Updated daughter about patient's current level and need for DVT prophylaxis.  Daughter reports that patient was very sedentary before, hates needles and has been refusing lovenox.  She would prefer using an alternative method.  She also stated that her sister would be here on 07/17 and would prefer patient to be d/c then as 24 hours supervision would be available.

## 2012-06-12 NOTE — Progress Notes (Signed)
Occupational Therapy Session Note  Patient Details  Name: Meagan [name] MRN: [id number] Date of Birth: [birth year]  Today's Date: 06/12/2012 Time: 0900-1000 Time Calculation (min): 60 min  Short Term Goals: Week 1:  OT Short Term Goal 1 (Week 1): STG=LTG of overall Supervision - Min assist  Skilled Therapeutic Interventions:  Self care retraining to include toilet transfer, sponge bath (declined shower), dress, & groom.  Focus session on functional mobility to include bed mobility, transfer bed>w/c><commode, sit><stand, standing balance, activity tolerance.  Patient required VCs for next steps during a simple familiar task, stated she had already toileted yet as soon as sitting EOB patient stated she had to use the bathroom as thought we had not discussed it only moments before.  Patient's function much improved with VCs for techniques.    Precautions:  Precautions: Fall Weight Bearing Restrictions: No  Pain: Pain Assessment Pain Assessment: No/denies pain  See FIM for current functional status  Therapy/Group: Individual Therapy  Marcelus Dubberly 06/12/2012, 3:53 PM

## 2012-06-13 MED ORDER — LISINOPRIL 5 MG PO TABS
5.0000 mg | ORAL_TABLET | Freq: Every day | ORAL | Status: DC
  Administered 2012-06-13 – 2012-06-16 (×4): 5 mg via ORAL
  Filled 2012-06-13 (×5): qty 1

## 2012-06-13 NOTE — Progress Notes (Signed)
Occupational Therapy Session Note  Patient Details  Name: Meagan [name] MRN: [id number] Date of Birth: [birth year]  Today's Date: 06/13/2012  First Session:  Time: 0900-1000 Time Calculation (min): 60 min  Second Session: Time: 3:00-3:45 Time Calculation (min): 45 min  Precautions:  Precautions Precautions: Fall Restrictions Weight Bearing Restrictions: No  Short Term Goals: Week 1:  OT Short Term Goal 1 (Week 1): STG=LTG of overall Supervision - Min assist  Skilled Therapeutic Interventions:  1)  Self care retraining to include toileting and toilet transfer, sponge bath (declined shower), dress, groom, laundry.  Focus session on sit to stand, standing balance,  task completion and problem solving during simple and familiar tasks listed above.  Patient ambulated to washing machine to put in her laundry and walked back to her room with supervision.  2)  Home management tasks to include watering plants and inventory kitchen cabinets.  Focus session on sit to stands, walker safety (patient wanted to walk around it and leave it behind therefore, completed part of these tasks without RW without any LOB.  HHA about half of the time and close supervision the other times.   Pain: Pain Assessment: No/denies pain at either session  See FIM for current functional status  Therapy/Group: Individual Therapy  Yale Golla 06/13/2012, 3:51 PM

## 2012-06-13 NOTE — Progress Notes (Signed)
Patient ID: Meagan [name], female   DOB: [birth year], 77 y.o.   MRN: [id number] .slept well. No complaints today .A 12 point review of systems has been performed and if not noted above is otherwise negative. CBG (last 3)  No results found for this basename: GLUCAP:3 in the last 72 hours   BP Readings from Last 3 Encounters:  06/13/12 165/64  06/06/12 147/56  02/08/12 150/80    Objective: Vital Signs: Blood pressure 165/64, pulse 63, temperature 98.4 F (36.9 C), temperature source Oral, resp. rate 20, height 5\' 11"  (1.803 m), weight 79.561 kg (175 lb 6.4 oz), SpO2 96.00%. No results found. No results found for this basename: WBC:2,HGB:2,HCT:2,PLT:2 in the last 72 hours No results found for this basename: NA:2,K:2,CL:2,CO2:2,GLUCOSE:2,BUN:2,CREATININE:2,CALCIUM:2 in the last 72 hours CBG (last 3)  No results found for this basename: GLUCAP:3 in the last 72 hours  Wt Readings from Last 3 Encounters:  06/07/12 79.561 kg (175 lb 6.4 oz)  06/07/12 85 kg (187 lb 6.3 oz)  02/08/12 80.287 kg (177 lb)    Physical Exam:  General appearance: alert, cooperative and mild distress, more relaxed today Head: Normocephalic, without obvious abnormality, atraumatic Eyes: conjunctivae/corneas clear. PERRL, EOM's intact. Fundi benign. Ears: normal TM's and external ear canals both ears Nose: Nares normal. Septum midline. Mucosa normal. No drainage or sinus tenderness. Throat: lips, mucosa, and tongue normal; teeth and gums normal Neck: no adenopathy, no carotid bruit, no JVD, supple, symmetrical, trachea midline and thyroid not enlarged, symmetric, no tenderness/mass/nodules Back: symmetric, no curvature. ROM normal. No CVA tenderness. Resp: clear to auscultation bilaterally Cardio: regular rate and rhythm, S1, S2 normal, no murmur, click, rub or gallop GI: soft, non-tender; bowel sounds normal; no masses,  no organomegaly Extremities: extremities normal, atraumatic, no cyanosis or  edema Pulses: 2+ and symmetric Skin: Skin color, texture, turgor normal. No rashes or lesions Neurologic: alert. Recalled therapy activities yesterday. Pt with ongoing pill rolling and intentional tremors. Moves all 4's with proximal weakness noted. No gross sensory deficits, CN exam normal. Oriented to person. Incision/Wound:     Assessment/Plan: 1. Functional deficits secondary to deconditioning, parkinson's pseudoexacerbation which require 3+ hours per day of interdisciplinary therapy in a comprehensive inpatient rehab setting. Physiatrist is providing close team supervision and 24 hour management of active medical problems listed below. Physiatrist and rehab team continue to assess barriers to discharge/monitor patient progress toward functional and medical goals. FIM: FIM - Bathing Bathing Steps Patient Completed: Chest;Right Arm;Left Arm;Abdomen;Front perineal area;Buttocks;Right upper leg;Left upper leg;Right lower leg (including foot);Left lower leg (including foot) Bathing: 4: Steadying assist  FIM - Upper Body Dressing/Undressing Upper body dressing/undressing steps patient completed: Thread/unthread right sleeve of pullover shirt/dresss;Thread/unthread left sleeve of pullover shirt/dress;Put head through opening of pull over shirt/dress;Pull shirt over trunk;Thread/unthread right sleeve of front closure shirt/dress;Thread/unthread left sleeve of front closure shirt/dress Upper body dressing/undressing: 4: Min-Patient completed 75 plus % of tasks FIM - Lower Body Dressing/Undressing Lower body dressing/undressing steps patient completed: Pull pants up/down;Thread/unthread left underwear leg;Thread/unthread right underwear leg;Pull underwear up/down Lower body dressing/undressing: 3: Mod-Patient completed 50-74% of tasks  FIM - Toileting Toileting steps completed by patient: Performs perineal hygiene;Adjust clothing prior to toileting Toileting Assistive Devices: Grab bar or rail  for support Toileting: 3: Mod-Patient completed 2 of 3 steps  FIM - Diplomatic Services operational officer Devices: Elevated toilet seat;Grab bars Toilet Transfers: 3-To toilet/BSC: Mod A (lift or lower assist);3-From toilet/BSC: Mod A (lift or lower assist)  FIM - Press photographer  Assistive Devices: Therapist, occupational: 5: Supine > Sit: Supervision (verbal cues/safety issues);5: Sit > Supine: Supervision (verbal cues/safety issues);4: Bed > Chair or W/C: Min A (steadying Pt. > 75%);4: Chair or W/C > Bed: Min A (steadying Pt. > 75%)  FIM - Locomotion: Wheelchair Distance: 50' Locomotion: Wheelchair: 0: Activity did not occur FIM - Locomotion: Ambulation Locomotion: Ambulation Assistive Devices: Designer, industrial/product Ambulation/Gait Assistance: 4: Min guard Locomotion: Ambulation: 2: Travels 50 - 149 ft with minimal assistance (Pt.>75%)  Comprehension Comprehension Mode: Auditory Comprehension: 3-Understands basic 50 - 74% of the time/requires cueing 25 - 50%  of the time  Expression Expression Mode: Verbal Expression: 4-Expresses basic 75 - 89% of the time/requires cueing 10 - 24% of the time. Needs helper to occlude trach/needs to repeat words.  Social Interaction Social Interaction: 5-Interacts appropriately 90% of the time - Needs monitoring or encouragement for participation or interaction.  Problem Solving Problem Solving: 2-Solves basic 25 - 49% of the time - needs direction more than half the time to initiate, plan or complete simple activities  Memory Memory: 2-Recognizes or recalls 25 - 49% of the time/requires cueing 51 - 75% of the time   MEDICAL PROBLEM LIST  1. DVT Prophylaxis/Anticoagulation: TED's, ambulating enough 2. Pain Management: N/A  3. Mood: Monitor for now. Confusion feeds her anxiety however. More relaxed today 4. Neuropsych: This patient is not capable of making decisions on his/her own behalf. Likely baseline dementia.   5. Parkinson's disease: Continue sinemet IR tid  6. E coli/Proteus UTI: cipro completed.  Patient remains afebrile. Follow voiding habits.  7. HTN: New diagnosis. Start low dose lisinopril and titrate slowly as needed. Remains slightly elevated. Increase lisinopril to 5mg  8. Anemia: MVI 9. Mild hypokalemia: supplement and recheck today   LOS (Days) 6 A FACE TO FACE EVALUATION WAS PERFORMED  Marteze Vecchio T 06/13/2012, 7:43 AM

## 2012-06-13 NOTE — Progress Notes (Signed)
Speech Language Pathology Daily Session Note  Patient Details  Name: Meagan [name] MRN: [id number] Date of Birth: 1935/03/28  Today's Date: 06/13/2012 Time: 0800-0830 Time Calculation (min): 30 min  Short Term Goals: Week 1: SLP Short Term Goal 1 (Week 1): Pt will sustain attention to a functional task for ~ 10 minutes with Min A question and verbal cues for redirection SLP Short Term Goal 1 - Progress (Week 1): Progressing toward goal SLP Short Term Goal 2 (Week 1): Pt will initiate functional tasks with Mod A verbal and questioning cues. SLP Short Term Goal 2 - Progress (Week 1): Progressing toward goal SLP Short Term Goal 3 (Week 1): Pt will demonstrate functional problem solving during basic and familiar tasks with Mod A verbal and visual cues. SLP Short Term Goal 3 - Progress (Week 1): Progressing toward goal SLP Short Term Goal 4 (Week 1): Pt will utilize external aids for orientaton to place, time and situation with Mod A verbal and questioning cues.  SLP Short Term Goal 5 (Week 1): Pt will demonstrate use of strategies to increase word-finding during functional conversation with Mod A verbal and question cues.  SLP Short Term Goal 5 - Progress (Week 1): Progressing toward goal  Skilled Therapeutic Interventions: Treatment focus on functional problem solving and utilization of external memory aids to increase recall/carryover of newly learned information.  Pt required Mod A question and verbal cues for functional problem solving for meal set-up.  Clinician utilized external aid of medication chart to increase recall of current medications and their functions. Pt required Max A verbal and question cues to utilize aid for recall during medication management task.    FIM:  Comprehension Comprehension Mode: Auditory Comprehension: 4-Understands basic 75 - 89% of the time/requires cueing 10 - 24% of the time Expression Expression Mode: Verbal Expression: 4-Expresses basic 75 - 89%  of the time/requires cueing 10 - 24% of the time. Needs helper to occlude trach/needs to repeat words. Social Interaction Social Interaction: 5-Interacts appropriately 90% of the time - Needs monitoring or encouragement for participation or interaction. Problem Solving Problem Solving: 2-Solves basic 25 - 49% of the time - needs direction more than half the time to initiate, plan or complete simple activities Memory Memory: 2-Recognizes or recalls 25 - 49% of the time/requires cueing 51 - 75% of the time  Pain Pain Assessment Pain Assessment: No/denies pain  Therapy/Group: Individual Therapy  Nakema Fake 06/13/2012, 2:18 PM

## 2012-06-13 NOTE — Progress Notes (Signed)
Physical Therapy Session Note  Patient Details  Name: Meagan [name] MRN: [id number] Date of Birth: [birth year]  Today's Date: 06/13/2012 Time: 4098-1191 Time Calculation (min): 57 min  Short Term Goals: Week 1:  PT Short Term Goal 1 (Week 1): Pt will perform sit <> stand with moderate assist consistently.  PT Short Term Goal 1 - Progress (Week 1): Progressing toward goal PT Short Term Goal 2 (Week 1): Pt will transfer bed <> wheelchair with moderate assist consistently. PT Short Term Goal 2 - Progress (Week 1): Progressing toward goal PT Short Term Goal 3 (Week 1): Pt will ambulate 30' with moderate assist and RW.  PT Short Term Goal 3 - Progress (Week 1): Progressing toward goal  Skilled Therapeutic Interventions/Progress Updates:    Wheelchair mobility x 25', pt with significant difficulty secondary to slow processing and impaired memory. She locks and unlocks brakes, raises and lowers armrests while trying to figure out how to turn wheels although verbally cued with appropriate sequence. Pt likely most appropriate for caregiver propulsion of wheelchair for longer distances.    Gait training without assistive device in controlled and home environment x 200', 100' with min assist over carpet and tile surfaces. Improved ability to negotiate self and safely place hands without assistive device. Performed obstacle course navigating cones (min assist) and stepping over obstacle (mod assist to prevent fall with one major loss of balance). Practiced stepping over tape lines to increase step length, max verbal cues needed for task. Feel pt will need assist with all gait however is slightly safer without RW.    Therapy Documentation Precautions:  Precautions Precautions: Fall Restrictions Weight Bearing Restrictions: No General:   Vital Signs: Therapy Vitals BP: 170/60 mmHg Pain: Pain Assessment Pain Assessment: No/denies pain Locomotion : Ambulation Ambulation/Gait Assistance: 3:  Mod assist (secondary to one large loss of balance) Wheelchair Mobility Distance: 25'   See FIM for current functional status  Therapy/Group: Individual Therapy  Wilhemina Bonito 06/13/2012, 12:24 PM

## 2012-06-14 DIAGNOSIS — N390 Urinary tract infection, site not specified: Secondary | ICD-10-CM

## 2012-06-14 DIAGNOSIS — G20 Parkinson's disease: Secondary | ICD-10-CM

## 2012-06-14 DIAGNOSIS — Z5189 Encounter for other specified aftercare: Secondary | ICD-10-CM

## 2012-06-14 DIAGNOSIS — R5381 Other malaise: Secondary | ICD-10-CM

## 2012-06-14 NOTE — Patient Care Conference (Signed)
Inpatient RehabilitationTeam Conference Note Date: 06/13/2012   Time: 2:00 PM    Patient Name: Meagan [name]      Medical Record Number: [id number]  Date of Birth: March 26, 1935 Sex: Female         Room/Bed: 4025/4025-02 Payor Info: Payor: [employer]  Plan: [employer]  Product Type: *No Product type*     Admitting Diagnosis: Parkinson's, UTI  Admit Date/Time:  06/07/2012  2:30 PM Admission Comments: No comment available   Primary Diagnosis:  <principal problem not specified> Principal Problem: <principal problem not specified>  Patient Active Problem List   Diagnosis Date Noted  . UTI (urinary tract infection), bacterial 06/07/2012  . Physical deconditioning 06/07/2012  . Parkinson disease 06/04/2012    Expected Discharge Date: Expected Discharge Date: 06/16/12  Team Members Present: Physician: Dr. Faith Rogue Case Manager Present: Lutricia Horsfall, RN Social Worker Present: Amada Jupiter, LCSW Nurse Present: Carmie End, RN PT Present: Edson Snowball, PT;Other (comment);Karolee Stamps, PT Rose Fillers, PT) OT Present: Edwin Cap, OT     Current Status/Progress Goal Weekly Team Focus  Medical   urosepsis, PD pseudoexacerbation,   improved balance and stamina  safety as related to cognitive deficits   Bowel/Bladder   toileting every 3 hours  continent  bowel and bladder  lbm 7-7  min assist   toilet every 3 hours   Swallow/Nutrition/ Hydration             ADL's   Overall Min Assist and Mod assist with LB Dressing & Toileting  Overall Supervision and Min Assist with BADL  Activity Tolerance, Sit>< Stands, standing balance   Mobility   Mostly min assist for mobility, limited to no carry over with very poor memory and decreased safety awareness.   Supervision/min assist  Determine if pt more safe with RW vs. no assistive device, increase safety and decrease risk for falls.    Communication             Safety/Cognition/ Behavioral  Observations  Mod-Max A  Min-Mod A  problem solving, working memory with use of external aids, initation    Pain   n/a         Skin   buttocks skin  mild redness scabbed from abrasion  skin protectant applied after toileting and bathing   no new breakdown   educate patient and faimily on skin care      *See Interdisciplinary Assessment and Plan and progress notes for long and short-term goals  Barriers to Discharge: poor memory and carry over    Possible Resolutions to Barriers:  family ed, routine/familiarity    Discharge Planning/Teaching Needs:    To d/c home w/ daughter--daughter to arrange 24/7 S/Assist.     Team Discussion: Cognitive issues--unsafe w/ walker--will d/c w/c level w/ HH f/up--to schedule family ed w/ daughter.   Revisions to Treatment Plan: none    Continued Need for Acute Rehabilitation Level of Care: The patient requires daily medical management by a physician with specialized training in physical medicine and rehabilitation for the following conditions: Daily direction of a multidisciplinary physical rehabilitation program to ensure safe treatment while eliciting the highest outcome that is of practical value to the patient.: Yes Daily medical management of patient stability for increased activity during participation in an intensive rehabilitation regime.: Yes Daily analysis of laboratory values and/or radiology reports with any subsequent need for medication adjustment of medical intervention for : Neurological problems;Other  Brock Ra 06/14/2012, 3:18 PM

## 2012-06-14 NOTE — Progress Notes (Signed)
Speech Language Pathology Daily Session Note  Patient Details  Name: Meagan [name] MRN: [id number] Date of Birth: [birth year]  Today's Date: 06/14/2012 Time: 0800-0830 Time Calculation (min): 30 min  Short Term Goals: Week 1: SLP Short Term Goal 1 (Week 1): Pt will sustain attention to a functional task for ~ 10 minutes with Min A question and verbal cues for redirection SLP Short Term Goal 1 - Progress (Week 1): Progressing toward goal SLP Short Term Goal 2 (Week 1): Pt will initiate functional tasks with Mod A verbal and questioning cues. SLP Short Term Goal 2 - Progress (Week 1): Progressing toward goal SLP Short Term Goal 3 (Week 1): Pt will demonstrate functional problem solving during basic and familiar tasks with Mod A verbal and visual cues. SLP Short Term Goal 3 - Progress (Week 1): Progressing toward goal SLP Short Term Goal 4 (Week 1): Pt will utilize external aids for orientaton to place, time and situation with Mod A verbal and questioning cues.  SLP Short Term Goal 5 (Week 1): Pt will demonstrate use of strategies to increase word-finding during functional conversation with Mod A verbal and question cues.  SLP Short Term Goal 5 - Progress (Week 1): Progressing toward goal  Skilled Therapeutic Interventions: Treatment focus on functional problem solving and working memory. Pt unable to locate the second visual aid made to increase recall of medication names and functions and required total A for recall without use of aid. Pt also required Max A verbal and questioning cues for functional problem solving for tray set-up.    FIM:  Comprehension Comprehension Mode: Auditory Comprehension: 4-Understands basic 75 - 89% of the time/requires cueing 10 - 24% of the time Expression Expression Mode: Verbal Expression: 4-Expresses basic 75 - 89% of the time/requires cueing 10 - 24% of the time. Needs helper to occlude trach/needs to repeat words. Social Interaction Social  Interaction: 5-Interacts appropriately 90% of the time - Needs monitoring or encouragement for participation or interaction. Problem Solving Problem Solving: 2-Solves basic 25 - 49% of the time - needs direction more than half the time to initiate, plan or complete simple activities Memory Memory: 2-Recognizes or recalls 25 - 49% of the time/requires cueing 51 - 75% of the time FIM - Eating Eating Activity: 5: Set-up assist for cut food  Pain Pain Assessment Pain Assessment: No/denies pain  Therapy/Group: Individual Therapy  Margaretmary Prisk 06/14/2012, 4:11 PM

## 2012-06-14 NOTE — Progress Notes (Signed)
Patient ID: Meagan [name], female   DOB: 09-12-1935, 77 y.o.   MRN: [id number] Patient ID: Meagan [name], female   DOB: [birth year], 77 y.o.   MRN: [id number] .slept well. No complaints today. Does have some edema in legs .A 12 point review of systems has been performed and if not noted above is otherwise negative. CBG (last 3)  No results found for this basename: GLUCAP:3 in the last 72 hours   BP Readings from Last 3 Encounters:  06/14/12 161/74  06/06/12 147/56  02/08/12 150/80    Objective: Vital Signs: Blood pressure 161/74, pulse 70, temperature 98.3 F (36.8 C), temperature source Oral, resp. rate 19, height 5\' 11"  (1.803 m), weight 79.561 kg (175 lb 6.4 oz), SpO2 96.00%. No results found. No results found for this basename: WBC:2,HGB:2,HCT:2,PLT:2 in the last 72 hours No results found for this basename: NA:2,K:2,CL:2,CO2:2,GLUCOSE:2,BUN:2,CREATININE:2,CALCIUM:2 in the last 72 hours CBG (last 3)  No results found for this basename: GLUCAP:3 in the last 72 hours  Wt Readings from Last 3 Encounters:  06/07/12 79.561 kg (175 lb 6.4 oz)  06/07/12 85 kg (187 lb 6.3 oz)  02/08/12 80.287 kg (177 lb)    Physical Exam:  General appearance: alert, cooperative and mild distress, more relaxed today Head: Normocephalic, without obvious abnormality, atraumatic Eyes: conjunctivae/corneas clear. PERRL, EOM's intact. Fundi benign. Ears: normal TM's and external ear canals both ears Nose: Nares normal. Septum midline. Mucosa normal. No drainage or sinus tenderness. Throat: lips, mucosa, and tongue normal; teeth and gums normal Neck: no adenopathy, no carotid bruit, no JVD, supple, symmetrical, trachea midline and thyroid not enlarged, symmetric, no tenderness/mass/nodules Back: symmetric, no curvature. ROM normal. No CVA tenderness. Resp: clear to auscultation bilaterally Cardio: regular rate and rhythm, S1, S2 normal, no murmur, click, rub or gallop GI: soft, non-tender; bowel  sounds normal; no masses,  no organomegaly Extremities: extremities normal, atraumatic, no cyanosis or edema Pulses: 2+ and symmetric Skin: Skin color, texture, turgor normal. No rashes or lesions Neurologic: alert. Recalled therapy activities yesterday. Pt with ongoing pill rolling and intentional tremors. Moves all 4's with proximal weakness noted. No gross sensory deficits, CN exam normal. Oriented to person. Incision/Wound:     Assessment/Plan: 1. Functional deficits secondary to deconditioning, parkinson's pseudoexacerbation which require 3+ hours per day of interdisciplinary therapy in a comprehensive inpatient rehab setting. Physiatrist is providing close team supervision and 24 hour management of active medical problems listed below. Physiatrist and rehab team continue to assess barriers to discharge/monitor patient progress toward functional and medical goals. FIM: FIM - Bathing Bathing Steps Patient Completed: Chest;Right Arm;Left Arm;Abdomen;Front perineal area;Buttocks;Right upper leg;Left upper leg;Right lower leg (including foot);Left lower leg (including foot) Bathing: 4: Steadying assist  FIM - Upper Body Dressing/Undressing Upper body dressing/undressing steps patient completed: Thread/unthread right sleeve of pullover shirt/dresss;Thread/unthread left sleeve of pullover shirt/dress;Put head through opening of pull over shirt/dress;Pull shirt over trunk;Thread/unthread right sleeve of front closure shirt/dress;Thread/unthread left sleeve of front closure shirt/dress;Pull shirt around back of front closure shirt/dress Upper body dressing/undressing: 5: Set-up assist to: Obtain clothing/put away FIM - Lower Body Dressing/Undressing Lower body dressing/undressing steps patient completed: Thread/unthread right underwear leg;Thread/unthread left underwear leg;Pull underwear up/down;Thread/unthread right pants leg;Thread/unthread left pants leg;Pull pants up/down;Don/Doff left  sock;Don/Doff right sock Lower body dressing/undressing: 4: Min-Patient completed 75 plus % of tasks  FIM - Toileting Toileting steps completed by patient: Adjust clothing prior to toileting;Performs perineal hygiene;Adjust clothing after toileting Toileting Assistive Devices: Grab bar or rail for support Toileting: 5: Supervision:  Safety issues/verbal cues  FIM - Archivist Transfers Assistive Devices: Grab bars;Elevated toilet seat Toilet Transfers: 5-To toilet/BSC: Supervision (verbal cues/safety issues);5-From toilet/BSC: Supervision (verbal cues/safety issues)  FIM - Banker Devices: Therapist, occupational: 5: Supine > Sit: Supervision (verbal cues/safety issues);5: Sit > Supine: Supervision (verbal cues/safety issues);4: Bed > Chair or W/C: Min A (steadying Pt. > 75%);4: Chair or W/C > Bed: Min A (steadying Pt. > 75%)  FIM - Locomotion: Wheelchair Distance: 25' Locomotion: Wheelchair: 1: Travels less than 50 ft with minimal assistance (Pt.>75%) FIM - Locomotion: Ambulation Locomotion: Ambulation Assistive Devices: Designer, industrial/product Ambulation/Gait Assistance: 3: Mod assist (secondary to one large loss of balance) Locomotion: Ambulation: 4: Travels 150 ft or more with minimal assistance (Pt.>75%)  Comprehension Comprehension Mode: Auditory Comprehension: 4-Understands basic 75 - 89% of the time/requires cueing 10 - 24% of the time  Expression Expression Mode: Verbal Expression: 4-Expresses basic 75 - 89% of the time/requires cueing 10 - 24% of the time. Needs helper to occlude trach/needs to repeat words.  Social Interaction Social Interaction: 5-Interacts appropriately 90% of the time - Needs monitoring or encouragement for participation or interaction.  Problem Solving Problem Solving: 2-Solves basic 25 - 49% of the time - needs direction more than half the time to initiate, plan or complete simple  activities  Memory Memory: 2-Recognizes or recalls 25 - 49% of the time/requires cueing 51 - 75% of the time   MEDICAL PROBLEM LIST  1. DVT Prophylaxis/Anticoagulation: TED's, ambulating enough 2. Pain Management: N/A  3. Mood: Monitor for now. Confusion feeds her anxiety however. More relaxed today 4. Neuropsych: This patient is not capable of making decisions on his/her own behalf. Likely baseline dementia.  5. Parkinson's disease: Continue sinemet IR tid  6. E coli/Proteus UTI: cipro completed.  Patient remains afebrile. Follow voiding habits.  7. HTN: New diagnosis. Start low dose lisinopril and titrate slowly as needed. Remains slightly elevated. Increased lisinopril to 5mg  8. Anemia: MVI 9. Mild hypokalemia: supplement and recheck today 10. Lower ext edema: TEDs, elevation for now   LOS (Days) 7 A FACE TO FACE EVALUATION WAS PERFORMED  SWARTZ,ZACHARY T 06/14/2012, 7:54 AM

## 2012-06-14 NOTE — Progress Notes (Signed)
Occupational Therapy Session Note  Patient Details  Name: Meagan [name] MRN: [id number] Date of Birth: 1935-10-02  Today's Date: 06/14/2012 Time: 7829-5621 Time Calculation (min): 48 min  Short Term Goals: Week 1:  OT Short Term Goal 1 (Week 1): STG=LTG of overall Supervision - Min assist  Skilled Therapeutic Interventions/Progress Updates:  Self care retraining to include sponge bath (declined shower), dress, & groom.  Patient agreed to take a shower tomorrow for her last day of OT therapy before discharge. Focus session on functional mobility to include bed mobility, transfer bed>w/c><commode, sit><stand, standing balance, activity tolerance.   Therapy Documentation Precautions:  Precautions Precautions: Fall Restrictions Weight Bearing Restrictions: No  Pain: No c/o pain  See FIM for current functional status  Therapy/Group: Individual Therapy  Meagan [name] 06/14/2012, 1:24 PM

## 2012-06-14 NOTE — Progress Notes (Signed)
Physical Therapy Note  Patient Details  Name: Meagan [name] MRN: [id number] Date of Birth: 1935-04-09 Today's Date: 06/14/2012  1300-1355 (55 minutes) individual Pain: no complaint of pain Focus of treatment: Gait training with/without AD ; Therapeutic activities to facilitate improved dynamic standing balance Treatment: Gait 150 feet X 1 without AD with patient performing dynamic activity (watering plants) min assist with decreased step length bilaterally and pt reaching for wall , chairs or rail for balance; Static standing with alternate LE on 6 inch step - required at least unilateral UE support to maintain position; sit to stand hands on knees to facilitate increased forward trunk lean during sit to stand ( pt requires several attempts to stand) Pt continues to need occasional vcs to use AD safely (stay inside AD when turning).   1530-1610 (40 minutes) individual Pain: no complaint of pain Focus of treatment: Therapeutic activities focused on dynamic standing balance; gait training without AD Treatment: Gait 150 feet min assist without AD with tactile cues to decrease forward lean X 2; Standing performing reaching activities forward, and upward to facilitate trunk extension and challenge standing balance ; sit to stand to bedside table to increase forward trunk flexion and to facilitate use of quads for standing vs rocking forward to stand. Arienna Benegas,JIM 06/14/2012, 1:45 PM

## 2012-06-14 NOTE — Progress Notes (Signed)
Social Work  Patient ID: Meagan [name], female   DOB: [birth year], 77 y.o.   MRN: [id number]  Met with patient and daughter yesterday to review team conference - both aware and agreeable with target d/c 7/12.  Daughter confirms that she will have 24/7 assistance in place for pt.  Explained need to have her complete family education, but daughter reports that she has difficulty getting anymore time away from job before 7/12.  Discussed with PT and plan is for pt to stay through the day on 7/12 with daughter to be in by noon and to complete family education in the afternoon and then to d/c.  Baillie Mohammad

## 2012-06-14 NOTE — Plan of Care (Signed)
Problem: RH KNOWLEDGE DEFICIT Goal: RH STG INCREASE KNOWLEDGE OF HYPERTENSION Patient verbalizes understanding on the risk factors, diet, normal BP. Patient able to demonstrate when and how to check BP, compliant with medication management. Mod assist from family .  Outcome: Not Progressing Patient forgetful at times

## 2012-06-15 NOTE — Progress Notes (Signed)
Speech Language Pathology Daily Session Note  Patient Details  Name: Meagan [name] MRN: [id number] Date of Birth: 28-Feb-1935  Today's Date: 06/15/2012 Time: 7829-5621 Time Calculation (min): 45 min  Short Term Goals: Week 1: SLP Short Term Goal 1 (Week 1): Pt will sustain attention to a functional task for ~ 10 minutes with Min A question and verbal cues for redirection SLP Short Term Goal 1 - Progress (Week 1): Progressing toward goal SLP Short Term Goal 2 (Week 1): Pt will initiate functional tasks with Mod A verbal and questioning cues. SLP Short Term Goal 2 - Progress (Week 1): Progressing toward goal SLP Short Term Goal 3 (Week 1): Pt will demonstrate functional problem solving during basic and familiar tasks with Mod A verbal and visual cues. SLP Short Term Goal 3 - Progress (Week 1): Progressing toward goal SLP Short Term Goal 4 (Week 1): Pt will utilize external aids for orientaton to place, time and situation with Mod A verbal and questioning cues.  SLP Short Term Goal 5 (Week 1): Pt will demonstrate use of strategies to increase word-finding during functional conversation with Mod A verbal and question cues.  SLP Short Term Goal 5 - Progress (Week 1): Progressing toward goal  Skilled Therapeutic Interventions: Treatment focus on problem solving and working memory with functional tasks. Pt required Max verbal and semantic cues to recall medications and functions during medication administration via RN. Pt independently verbalized she needed to use the bathroom and ambulated with the walker with Min verbal cues for hand placement and overall safety awareness.    FIM:  Comprehension Comprehension: 5-Understands basic 90% of the time/requires cueing < 10% of the time Expression Expression: 5-Expresses basic needs/ideas: With extra time/assistive device Social Interaction Social Interaction: 6-Interacts appropriately with others with medication or extra time (anti-anxiety,  antidepressant). Problem Solving Problem Solving: 3-Solves basic 50 - 74% of the time/requires cueing 25 - 49% of the time Memory Memory: 2-Recognizes or recalls 25 - 49% of the time/requires cueing 51 - 75% of the time  Pain Pain Assessment Pain Assessment: No/denies pain  Therapy/Group: Individual Therapy  Lori Liew 06/15/2012, 4:51 PM

## 2012-06-15 NOTE — Progress Notes (Signed)
Occupational Therapy Session Note  Patient Details  Name: Meagan [name] MRN: [id number] Date of Birth: 1935/04/21  Today's Date: 06/15/2012 Time: 4098-1191 Time Calculation (min): 60 min   Skilled Therapeutic Interventions/Progress Updates: Patient seen this am for bathing and dressing at shower level to address safety, activity tolerance, and balance during daily self care skills.  Patient opted to walk with walker to bathroom for shower, needing min assist for turns, obstacles, changing direction.  Patient needed min assist to stand from low surface, and frequent attempts and momentum to stand from medium height surfaces.  Patient needs cueing for hand placement for lift off.       Therapy Documentation Precautions:  Precautions Precautions: Fall Restrictions Weight Bearing Restrictions: No   Pain:  No report of pain      See FIM for current functional status  Therapy/Group: Individual Therapy  Collier Salina 06/15/2012, 1:29 PM

## 2012-06-15 NOTE — Progress Notes (Signed)
.slept well. No new complaints .A 12 point review of systems has been performed and if not noted above is otherwise negative. CBG (last 3)  No results found for this basename: GLUCAP:3 in the last 72 hours   BP Readings from Last 3 Encounters:  06/15/12 131/71  06/06/12 147/56  02/08/12 150/80    Objective: Vital Signs: Blood pressure 131/71, pulse 58, temperature 98.4 F (36.9 C), temperature source Oral, resp. rate 19, height 5\' 11"  (1.803 m), weight 76.1 kg (167 lb 12.3 oz), SpO2 92.00%. No results found. No results found for this basename: WBC:2,HGB:2,HCT:2,PLT:2 in the last 72 hours No results found for this basename: NA:2,K:2,CL:2,CO2:2,GLUCOSE:2,BUN:2,CREATININE:2,CALCIUM:2 in the last 72 hours CBG (last 3)  No results found for this basename: GLUCAP:3 in the last 72 hours  Wt Readings from Last 3 Encounters:  06/14/12 76.1 kg (167 lb 12.3 oz)  06/07/12 85 kg (187 lb 6.3 oz)  02/08/12 80.287 kg (177 lb)    Physical Exam:  General appearance: alert, cooperative and mild distress, more relaxed today Head: Normocephalic, without obvious abnormality, atraumatic Eyes: conjunctivae/corneas clear. PERRL, EOM's intact. Fundi benign. Ears: normal TM's and external ear canals both ears Nose: Nares normal. Septum midline. Mucosa normal. No drainage or sinus tenderness. Throat: lips, mucosa, and tongue normal; teeth and gums normal Neck: no adenopathy, no carotid bruit, no JVD, supple, symmetrical, trachea midline and thyroid not enlarged, symmetric, no tenderness/mass/nodules Back: symmetric, no curvature. ROM normal. No CVA tenderness. Resp: clear to auscultation bilaterally Cardio: regular rate and rhythm, S1, S2 normal, no murmur, click, rub or gallop GI: soft, non-tender; bowel sounds normal; no masses,  no organomegaly Extremities: edema trace in both legs Pulses: 2+ and symmetric Skin: Skin color, texture, turgor normal. No rashes or lesions Neurologic: alert. Recalled  therapy activities yesterday. Pt with ongoing pill rolling and intentional tremors. Moves all 4's with proximal weakness noted. No gross sensory deficits, CN exam normal. Oriented to person. Incision/Wound:     Assessment/Plan: 1. Functional deficits secondary to deconditioning, parkinson's pseudoexacerbation which require 3+ hours per day of interdisciplinary therapy in a comprehensive inpatient rehab setting. Physiatrist is providing close team supervision and 24 hour management of active medical problems listed below. Physiatrist and rehab team continue to assess barriers to discharge/monitor patient progress toward functional and medical goals.  Finalizing dc planning  FIM: FIM - Bathing Bathing Steps Patient Completed: Chest;Right Arm;Left Arm;Abdomen;Front perineal area;Buttocks;Right upper leg;Left upper leg;Right lower leg (including foot);Left lower leg (including foot) Bathing: 5: Supervision: Safety issues/verbal cues  FIM - Upper Body Dressing/Undressing Upper body dressing/undressing steps patient completed: Thread/unthread right sleeve of pullover shirt/dresss;Thread/unthread left sleeve of pullover shirt/dress;Put head through opening of pull over shirt/dress;Pull shirt over trunk;Thread/unthread right sleeve of front closure shirt/dress;Thread/unthread left sleeve of front closure shirt/dress;Pull shirt around back of front closure shirt/dress Upper body dressing/undressing: 5: Set-up assist to: Obtain clothing/put away FIM - Lower Body Dressing/Undressing Lower body dressing/undressing steps patient completed: Thread/unthread right underwear leg;Thread/unthread left underwear leg;Pull underwear up/down;Thread/unthread right pants leg;Thread/unthread left pants leg;Pull pants up/down;Don/Doff left sock;Don/Doff right sock Lower body dressing/undressing: 4: Min-Patient completed 75 plus % of tasks  FIM - Toileting Toileting steps completed by patient: Adjust clothing prior to  toileting;Performs perineal hygiene;Adjust clothing after toileting Toileting Assistive Devices: Grab bar or rail for support Toileting: 5: Supervision: Safety issues/verbal cues  FIM - Diplomatic Services operational officer Devices: Grab bars;Elevated toilet seat Toilet Transfers: 5-To toilet/BSC: Supervision (verbal cues/safety issues);5-From toilet/BSC: Supervision (verbal cues/safety issues)  FIM -  Bed/Chair Transport planner Devices: Therapist, occupational: 5: Supine > Sit: Supervision (verbal cues/safety issues);5: Sit > Supine: Supervision (verbal cues/safety issues);4: Bed > Chair or W/C: Min A (steadying Pt. > 75%);4: Chair or W/C > Bed: Min A (steadying Pt. > 75%)  FIM - Locomotion: Wheelchair Distance: 25' Locomotion: Wheelchair: 1: Travels less than 50 ft with minimal assistance (Pt.>75%) FIM - Locomotion: Ambulation Locomotion: Ambulation Assistive Devices: Designer, industrial/product Ambulation/Gait Assistance: 3: Mod assist (secondary to one large loss of balance) Locomotion: Ambulation: 4: Travels 150 ft or more with minimal assistance (Pt.>75%)  Comprehension Comprehension Mode: Auditory Comprehension: 4-Understands basic 75 - 89% of the time/requires cueing 10 - 24% of the time  Expression Expression Mode: Verbal Expression: 4-Expresses basic 75 - 89% of the time/requires cueing 10 - 24% of the time. Needs helper to occlude trach/needs to repeat words.  Social Interaction Social Interaction: 5-Interacts appropriately 90% of the time - Needs monitoring or encouragement for participation or interaction.  Problem Solving Problem Solving: 2-Solves basic 25 - 49% of the time - needs direction more than half the time to initiate, plan or complete simple activities  Memory Memory: 2-Recognizes or recalls 25 - 49% of the time/requires cueing 51 - 75% of the time   MEDICAL PROBLEM LIST  1. DVT Prophylaxis/Anticoagulation: TED's, ambulating enough 2.  Pain Management: N/A  3. Mood: Monitor for now. Confusion feeds her anxiety however. More relaxed today 4. Neuropsych: This patient is not capable of making decisions on his/her own behalf. Likely baseline dementia.  5. Parkinson's disease: Continue sinemet IR tid  6. E coli/Proteus UTI: cipro completed.  Patient remains afebrile. Follow voiding habits.  7. HTN: New diagnosis. Start low dose lisinopril and titrate slowly as needed. Remains slightly elevated. Increased lisinopril to 5mg  8. Anemia: MVI 9. Mild hypokalemia: supplement and recheck today 10. Lower ext edema: TEDs ordered, elevation for now   LOS (Days) 8 A FACE TO FACE EVALUATION WAS PERFORMED  Meagan [name] T 06/15/2012, 6:48 AM

## 2012-06-15 NOTE — Progress Notes (Signed)
Physical Therapy Session Note  Patient Details  Name: Suri Tafolla MRN: [id number] Date of Birth: [birth year]  Today's Date: 06/15/2012 Time: 8295-6213 Time Calculation (min): 58 min  Short Term Goals: Week 1:  PT Short Term Goal 1 (Week 1): Pt will perform sit <> stand with moderate assist consistently.  PT Short Term Goal 1 - Progress (Week 1): Progressing toward goal PT Short Term Goal 2 (Week 1): Pt will transfer bed <> wheelchair with moderate assist consistently. PT Short Term Goal 2 - Progress (Week 1): Progressing toward goal PT Short Term Goal 3 (Week 1): Pt will ambulate 30' with moderate assist and RW.  PT Short Term Goal 3 - Progress (Week 1): Progressing toward goal  Skilled Therapeutic Interventions/Progress Updates: Pain: no c/o pain    Gait training x 100', 150' without device in controlled environment, min assist. Practiced on obstacle course navigating cones, backwards walking, stepping over small obstacles, min assist moderate verbal cues for safety. Pt continues to be safer with obstacles when she does not have an assistive device. Gait training while kicking bean bag 2 x 75' with min assist working on single limb stance with gait and cognitive challenge. Practiced in room with RW, pt with increased difficulty cognitively when utilizing RW. She does not approach wheelchair appropriately, sits prematurely, and is often unable to back step (will march in one place until RW physically moved back for her).   Second Session Skilled Therapeutic Interventions/Progress Updates:  Time: 0865-7846  Time Calculation (min): 28 min Pain: 0/10 Allowed pt to test out smaller wheelchair, reports comfort in 18"x 18" chair. Performed repeated sit <> stands for strength, conditioning, and to practice seqncing and technique. Min assist progressing to min-guard assist.Short distance negotiation in room without device focusing on following 2 step directions- max verbal cues needed.    Therapy Documentation Precautions:  Precautions Precautions: Fall Restrictions Weight Bearing Restrictions: No  See FIM for current functional status  Therapy/Group: Individual Therapy both sessions  Wilhemina Bonito 06/15/2012, 5:44 PM

## 2012-06-16 DIAGNOSIS — G20A1 Parkinson's disease without dyskinesia, without mention of fluctuations: Secondary | ICD-10-CM

## 2012-06-16 DIAGNOSIS — N390 Urinary tract infection, site not specified: Secondary | ICD-10-CM

## 2012-06-16 DIAGNOSIS — G20 Parkinson's disease: Secondary | ICD-10-CM

## 2012-06-16 DIAGNOSIS — Z5189 Encounter for other specified aftercare: Secondary | ICD-10-CM

## 2012-06-16 DIAGNOSIS — R5381 Other malaise: Secondary | ICD-10-CM

## 2012-06-16 MED ORDER — LISINOPRIL 5 MG PO TABS: 5.0000 mg | ORAL_TABLET | Freq: Every day | ORAL | Status: DC

## 2012-06-16 NOTE — Progress Notes (Signed)
Patient ID: Meagan [name], female   DOB: May 08, 1935, 77 y.o.   MRN: [id number]  .slept well. No new complaints .A 12 point review of systems has been performed and if not noted above is otherwise negative. CBG (last 3)  No results found for this basename: GLUCAP:3 in the last 72 hours   BP Readings from Last 3 Encounters:  06/16/12 141/61  06/06/12 147/56  02/08/12 150/80    Objective: Vital Signs: Blood pressure 141/61, pulse 66, temperature 98.8 F (37.1 C), temperature source Oral, resp. rate 20, height 5\' 11"  (1.803 m), weight 76.1 kg (167 lb 12.3 oz), SpO2 96.00%. No results found. No results found for this basename: WBC:2,HGB:2,HCT:2,PLT:2 in the last 72 hours No results found for this basename: NA:2,K:2,CL:2,CO2:2,GLUCOSE:2,BUN:2,CREATININE:2,CALCIUM:2 in the last 72 hours CBG (last 3)  No results found for this basename: GLUCAP:3 in the last 72 hours  Wt Readings from Last 3 Encounters:  06/14/12 76.1 kg (167 lb 12.3 oz)  06/07/12 85 kg (187 lb 6.3 oz)  02/08/12 80.287 kg (177 lb)    Physical Exam:  General appearance: alert, cooperative and mild distress, more relaxed today Head: Normocephalic, without obvious abnormality, atraumatic Eyes: conjunctivae/corneas clear. PERRL, EOM's intact. Fundi benign. Ears: normal TM's and external ear canals both ears Nose: Nares normal. Septum midline. Mucosa normal. No drainage or sinus tenderness. Throat: lips, mucosa, and tongue normal; teeth and gums normal Neck: no adenopathy, no carotid bruit, no JVD, supple, symmetrical, trachea midline and thyroid not enlarged, symmetric, no tenderness/mass/nodules Back: symmetric, no curvature. ROM normal. No CVA tenderness. Resp: clear to auscultation bilaterally Cardio: regular rate and rhythm, S1, S2 normal, no murmur, click, rub or gallop GI: soft, non-tender; bowel sounds normal; no masses,  no organomegaly Extremities: edema trace in both legs Pulses: 2+ and symmetric Skin:  Skin color, texture, turgor normal. No rashes or lesions Neurologic: somewhat slow to arouse in the am. Recalled therapy activities yesterday. Pt with ongoing pill rolling and intentional tremors. Moves all 4's with proximal weakness noted but improving. No gross sensory deficits, CN exam normal. Oriented to person. Incision/Wound:     Assessment/Plan: 1. Functional deficits secondary to deconditioning, parkinson's pseudoexacerbation which require 3+ hours per day of interdisciplinary therapy in a comprehensive inpatient rehab setting. Physiatrist is providing close team supervision and 24 hour management of active medical problems listed below. Physiatrist and rehab team continue to assess barriers to discharge/monitor patient progress toward functional and medical goals.  Dc today  FIM: FIM - Bathing Bathing Steps Patient Completed: Chest;Right Arm;Left Arm;Abdomen;Front perineal area;Buttocks;Right upper leg;Left upper leg Bathing: 4: Min-Patient completes 8-9 69f 10 parts or 75+ percent  FIM - Upper Body Dressing/Undressing Upper body dressing/undressing steps patient completed: Thread/unthread right sleeve of front closure shirt/dress;Thread/unthread left sleeve of front closure shirt/dress;Pull shirt around back of front closure shirt/dress Upper body dressing/undressing: 4: Min-Patient completed 75 plus % of tasks (small buttons) FIM - Lower Body Dressing/Undressing Lower body dressing/undressing steps patient completed: Thread/unthread right underwear leg;Thread/unthread left underwear leg;Thread/unthread right pants leg;Thread/unthread left pants leg;Don/Doff right sock;Don/Doff left sock;Don/Doff right shoe;Don/Doff left shoe;Fasten/unfasten right shoe;Fasten/unfasten left shoe Lower body dressing/undressing: 4: Min-Patient completed 75 plus % of tasks  FIM - Toileting Toileting steps completed by patient: Adjust clothing prior to toileting Toileting Assistive Devices: Grab bar or  rail for support Toileting: 5: Supervision: Safety issues/verbal cues  FIM - Diplomatic Services operational officer Devices: Art gallery manager Transfers: 5-To toilet/BSC: Supervision (verbal cues/safety issues);4-From toilet/BSC: Min A (steadying Pt. > 75%)  FIM - Bed/Chair Transfer Bed/Chair Transfer Assistive Devices: Therapist, occupational: 5: Supine > Sit: Supervision (verbal cues/safety issues);5: Sit > Supine: Supervision (verbal cues/safety issues);4: Bed > Chair or W/C: Min A (steadying Pt. > 75%);4: Chair or W/C > Bed: Min A (steadying Pt. > 75%)  FIM - Locomotion: Wheelchair Distance: 25' Locomotion: Wheelchair: 1: Travels less than 50 ft with minimal assistance (Pt.>75%) FIM - Locomotion: Ambulation Locomotion: Ambulation Assistive Devices: Walker - Rolling;Other (comment) (none) Ambulation/Gait Assistance: 4: Min assist Locomotion: Ambulation: 4: Travels 150 ft or more with minimal assistance (Pt.>75%)  Comprehension Comprehension Mode: Auditory Comprehension: 5-Understands complex 90% of the time/Cues < 10% of the time  Expression Expression Mode: Verbal Expression: 5-Expresses basic needs/ideas: With no assist  Social Interaction Social Interaction: 6-Interacts appropriately with others with medication or extra time (anti-anxiety, antidepressant).  Problem Solving Problem Solving: 3-Solves basic 50 - 74% of the time/requires cueing 25 - 49% of the time  Memory Memory: 2-Recognizes or recalls 25 - 49% of the time/requires cueing 51 - 75% of the time   MEDICAL PROBLEM LIST  1. DVT Prophylaxis/Anticoagulation: TED's, ambulating enough 2. Pain Management: N/A  3. Mood: Monitor for now. Confusion feeds her anxiety however. More relaxed today 4. Neuropsych: This patient is not capable of making decisions on his/her own behalf. Likely baseline dementia.  5. Parkinson's disease: Continue sinemet IR tid  6. E coli/Proteus UTI: cipro completed.  Patient remains  afebrile. Follow voiding habits.  7. HTN: New diagnosis. Start low dose lisinopril and titrate slowly as needed. Remains slightly elevated. Increased lisinopril to 5mg  8. Anemia: MVI 9. Mild hypokalemia: supplement and recheck today 10. Lower ext edema: TEDs ordered, elevation for now. Weights are down overall, but she's eating fairly well for most meals. Needs cueing for consistent intake.    LOS (Days) 9 A FACE TO FACE EVALUATION WAS PERFORMED  Flonnie Wierman T 06/16/2012, 6:54 AM

## 2012-06-16 NOTE — Progress Notes (Signed)
Occupational Therapy Session Note & Discharge Summary  Patient Details  Name: Meagan [name] MRN: [id number] Date of Birth: February 13, 1935  Today's Date: 06/16/2012  SESSION NOTE  Time: 1330-1410 Time Calculation (min): 40 min Individual Therapy No complaints of pain Treatment focus on education to patient's daughter regarding basic self-care tasks, toilet transfers, functional mobility with use of rolling walker, and shower stall transfers. Patient requires overall min assist for transfers and BADLs except is supervision for toileting & toilet transfers. Patient's daughter is independent to assist at home as patient's caregiver.   ------------------------------------------------------------------------------------------------------------------   DISCHARGE SUMMARY  Patient has met 8 of 9 long term goals due to improved activity tolerance, improved balance, postural control, ability to compensate for deficits, improved attention, improved awareness and improved coordination.  Patient to discharge at overall supervision - min assist level.  Patient's care partner is independent to provide the necessary physical assistance at discharge.    Reasons goals not met: Patient did not meet UB dressing goal of supervision, currently patient does require min assist to complete UB dressing.   Recommendation:  Patient will benefit from ongoing skilled OT services in home health setting to continue to advance functional skills in the area of BADL, iADL and Reduce care partner burden.  Equipment: BSC  Reasons for discharge: treatment goals met and discharge from hospital  Patient/family agrees with progress made and goals achieved: Yes  Precautions/Restrictions  Precautions Precautions: Fall Restrictions Weight Bearing Restrictions: No  Vision/Perception  Vision - History Baseline Vision: Wears glasses only for reading   Cognition Overall Cognitive Status: Impaired Arousal/Alertness:  Awake/alert Orientation Level: Oriented X4 (with Mod cues) Attention: Sustained;Selective Sustained Attention: Appears intact Selective Attention: Impaired Selective Attention Impairment: Verbal basic;Functional basic Memory: Impaired Memory Impairment: Decreased short term memory;Decreased recall of new information Decreased Short Term Memory: Verbal basic;Functional basic Awareness: Impaired Awareness Impairment: Intellectual impairment Problem Solving: Impaired Problem Solving Impairment: Verbal basic;Functional basic Reasoning: Impaired Reasoning Impairment: Verbal basic;Functional basic Sequencing: Impaired Sequencing Impairment: Verbal basic;Functional basic Organizing: Impaired Organizing Impairment: Verbal basic;Functional basic Decision Making: Impaired Decision Making Impairment: Verbal basic;Functional basic Initiating: Impaired Initiating Impairment: Verbal basic;Functional basic Self Correcting: Impaired Self Correcting Impairment: Verbal basic;Functional basic Safety/Judgment: Impaired  Sensation Sensation Light Touch: Appears Intact Coordination Gross Motor Movements are Fluid and Coordinated: No Fine Motor Movements are Fluid and Coordinated: No Coordination and Movement Description: same as admission  Motor - See Discharge Navigator  Mobility - See Discharge Navigator  Trunk/Postural Assessment - See Discharge Navigator  Balance- See Discharge Navigator  Extremity/Trunk Assessment RUE Assessment RUE Assessment: Within Functional Limits (for basic self-care tasks) LUE Assessment LUE Assessment: Within Functional Limits (for basic self-care tasks)  See FIM for current functional status  Liese Dizdarevic 06/16/2012, 3:12 PM

## 2012-06-16 NOTE — Progress Notes (Signed)
Speech Language Pathology Session Note & Discharge Summary  Patient Details  Name: Meagan [name] MRN: [id number] Date of Birth: [birth year]  Today's Date: 06/16/2012 Time: 1300-1330 Time Calculation (min): 30 min  Skilled Therapeutic Intervention: Treatment focus on family education in regards to pt's cognitive function and strategies to utilize at home to maximize cognitive function, independence and overall safety. Handouts given and pt's daughter verbalized understanding.   Patient has met 4 of 6 long term goals.  Patient to discharge at overall Mod level.   Reasons goals not met: Pt requires verbal cueing for safety awarness and intellectual awareness into deficits Pt also requires cueing to [facility] external memory aids to increase recall.    Clinical Impression/Discharge Summary: Pt has made functional gains and has met 4 out of 6 LTG's this reporting period.  Currently, pt is overall Mod-Max A for functional problem solving, safety awareness, intellectual awareness into deficits working memory with utilization of strategies and selective attention to task. Pt has also demonstrated increased thought organization and verbal expression for wants/needs. Pt/family education complete and pt will discharge home with family 24 hour supervision. Pt would benefit from follow-up home health services to maximize cognitive function and overall independence.   Care Partner:  Caregiver Able to Provide Assistance: Yes  Type of Caregiver Assistance: Cognitive  Recommendation:  Home Health SLP  Rationale for SLP Follow Up: Maximize cognitive function and independence   Equipment: N/A   Reasons for discharge: Discharged from hospital   Patient/Family Agrees with Progress Made and Goals Achieved: Yes   See FIM for current functional status  Kaliyah Gladman 06/16/2012, 1:54 PM

## 2012-06-16 NOTE — Discharge Summary (Addendum)
Physical Therapy Discharge Summary  Patient Details  Name: Meagan [name] MRN: [id number] Date of Birth: 1935/03/22  Today's Date: 06/16/2012 Time: 7829-5621 Time Calculation (min): 45 min  Skilled Therapeutic Interventions/Progress Updates:  Session focused on family/caregiver education of daughter. Daughter provided min assist/supervision for all gait and mobility during session with therapist providing verbal cues for positioning for safety.Daughter desires to use RW with pt as opposed to no device at this time.  Performed multiple sit <> stands from various heights with daughter practicing verbal cues and providing min assist. Car transfer practiced with RW, daughter able to provide assist with extra tips provided by therapist for safety. Pt performed steps x 2 with daughter, min assist - cues for safe positioning of caregiver and appropriate verbal cues. Pt ambulated >200' controlled environment, 60' home environment (over carpet, side stepping/back walking) with RW and pt's daughter providing assist and verbal cues as needed. PT able to provide daughter with effective verbal cues to make transfers safer for both pt and caregiver. Pt with momentary chest pain/"funny feeling" during stand pivot transfer that resolved in seconds. Pt with no SOB or sharp pains. RN and PA made aware.  Educated daughter on pt's very high fall risk given balance and cognition, also discussed absolute need for 24 hour supervision in the home. Recommended using gait belt to hold pt in wheelchair if daughter is to leave room. Recommended pt not be left alone to cook or alone in the shower. Daughter verbalized understanding. She has no further questions.  Patient has met 6 of 6 long term goals due to improved activity tolerance, improved balance and ability to compensate for deficits.  Patient to discharge at an ambulatory level Min Assist.   Patient's care partner is independent to provide the necessary physical and  cognitive assistance at discharge.  Reasons goals not met: NA  Recommendation:  Patient will benefit from ongoing skilled PT services in home health setting to continue to advance safe functional mobility, address ongoing impairments in balance, safety with mobility, safety awareness, functional strength, and minimize fall risk.  Equipment: Wheelchair  Reasons for discharge: treatment goals met and discharge from hospital  Patient/family agrees with progress made and goals achieved: Yes  PT Discharge Precautions/Restrictions Precautions Precautions: Fall Precaution Comments: Needs 24/7 supervision Restrictions Weight Bearing Restrictions: No Pain Pain Assessment Pain Assessment: No/denies pain Vision/Perception  Vision - History Baseline Vision: Wears glasses only for reading  Cognition Overall Cognitive Status: Impaired Arousal/Alertness: Awake/alert Orientation Level: Oriented X4 (with Mod cues) Attention: Sustained;Selective Sustained Attention: Appears intact Selective Attention: Impaired Selective Attention Impairment: Verbal basic;Functional basic Memory: Impaired Memory Impairment: Decreased short term memory;Decreased recall of new information Decreased Short Term Memory: Verbal basic;Functional basic Awareness: Impaired Awareness Impairment: Intellectual impairment Problem Solving: Impaired Problem Solving Impairment: Verbal basic;Functional basic Reasoning: Impaired Reasoning Impairment: Verbal basic;Functional basic Sequencing: Impaired Sequencing Impairment: Verbal basic;Functional basic Organizing: Impaired Organizing Impairment: Verbal basic;Functional basic Decision Making: Impaired Decision Making Impairment: Verbal basic;Functional basic Initiating: Impaired Initiating Impairment: Verbal basic;Functional basic Self Correcting: Impaired Self Correcting Impairment: Verbal basic;Functional basic Safety/Judgment: Impaired Sensation Sensation Light  Touch: Appears Intact Proprioception: Appears Intact Coordination Gross Motor Movements are Fluid and Coordinated: No Fine Motor Movements are Fluid and Coordinated: No Coordination and Movement Description: Tremors much improved since initial evaluation (likely due to evaluation prior to morning medications)   Mobility Bed Mobility Bed Mobility: Rolling Right;Rolling Left;Supine to Sit;Sit to Supine Rolling Right: 5: Supervision Rolling Left: 5: Supervision Left Sidelying to Sit: 5: Supervision  Supine to Sit: 5: Supervision Sit to Supine: 5: Supervision Transfers Sit to Stand: 4: Min assist Sit to Stand Details (indicate cue type and reason): Verbal cues for scooting to edge of bed/mat, placing feet under self, and anteiror trunk translation.  Stand to Sit: 4: Min assist Stand to Sit Details: Verbal cues for squaring with chair prior to sitting, safe UE positioning.  Stand Pivot Transfers: 4: Min assist Stand Pivot Transfer Details (indicate cue type and reason): At times need assist to direct RW, needs verbal cues to remind her of task at hand.  Locomotion  Ambulation Ambulation: Yes Ambulation/Gait Assistance: 4: Min guard Ambulation Distance (Feet): 250 Feet Assistive device: Rolling walker;None Ambulation/Gait Assistance Details: Verbal cues for safe use of DME/AE;Verbal cues for gait pattern Gait Gait: Yes Gait Pattern: Impaired Gait Pattern: Shuffle;Trunk flexed;Step-through pattern (decreased foot clearance bilaterally) High Level Ambulation High Level Ambulation: Side stepping;Backwards walking;Direction changes;Sudden stops Side Stepping: min--guard assist  Backwards Walking: min--guard assist  Direction Changes: min--guard assist  Sudden Stops: min--guard assist  Stairs / Additional Locomotion Stairs: Yes Stairs Assistance: 4: Min assist Stairs Assistance Details: Verbal cues for technique;Tactile cues for sequencing;Tactile cues for initiation Stairs Assistance  Details (indicate cue type and reason): Pt continues to need step by step verbal cues for step Stair Management Technique: One rail Right;Step to pattern;Forwards Number of Stairs: 2    Balance Static Standing Balance Static Standing - Balance Support: No upper extremity supported Static Standing - Level of Assistance: 5: Stand by assistance Dynamic Standing Balance Dynamic Standing - Balance Support: Left upper extremity supported Dynamic Standing - Level of Assistance: 5: Stand by assistance Extremity Assessment  RUE Assessment RUE Assessment: Within Functional Limits (for basic self-care tasks) LUE Assessment LUE Assessment: Within Functional Limits (for basic self-care tasks) RLE Assessment RLE Assessment: Exceptions to [facility] RLE AROM (degrees) RLE Overall AROM Comments: WFL with exception of limited dorsiflexion. RLE Strength RLE Overall Strength Comments: Generalized deconditioning grossly >/= 3+/5 functionally very weak. LLE Assessment LLE Assessment: Exceptions to WFL LLE AROM (degrees) LLE Overall AROM Comments: WFL with exception of limited dorsiflexion. LLE Strength LLE Overall Strength Comments: Generalized deconditioning grossly >/= 3+/5 functionally very weak.  See FIM for current functional status  Meagan [name] 06/16/2012, 5:12 PM

## 2012-06-16 NOTE — Progress Notes (Signed)
Social Work  Discharge Note  The overall goal for the admission was met for:   Discharge location: Yes - home with daughter and other family to share in providing 24/7 assistance.  Length of Stay: Yes - 9 days  Discharge activity level: Yes - supervision to minimal assistance  Home/community participation: Yes  Services provided included: MD, RD, PT, OT, SLP, RN, CM, TR, Pharmacy and SW  Financial Services: Medicare and Private Insurance: [employer]  Follow-up services arranged: Home Health: PT, OT, ST via [facility], DME: 18x18 [employer] w/c with cushion and full-length desk arms, 3n1 commode via [facility] (daughter to purchase recommended tub seat) and Patient/Family has no preference for HH/DME agencies  Comments (or additional information): Provided information on local Parkinson's Support Group and Adult Day Care Resources  Patient/Family verbalized understanding of follow-up arrangements: Yes  Individual responsible for coordination of the follow-up plan:  daughter  Confirmed correct DME delivered: Amada Jupiter 06/16/2012    Mariaclara Spear

## 2012-06-16 NOTE — Progress Notes (Signed)
PT Meagan [name], reported to RN patient complained of chest pain during therapy session during ambulation.  Assessed patient and patient denied chest pain vital signs blood pressure sitting 101/50 pulse 82 o2 sat at 99% on room air . P. Love PA aware . Continue of plan of care.                                      [name]

## 2012-06-16 NOTE — Progress Notes (Signed)
Patient discharged to home with daughter at 25 alert and continues to deny chest pain . Patient and daughter verbalized understanding of discharge instructions given and reviewed by P. Love PA .  Patient discharged with all belongings .                                                                 [name]

## 2012-06-16 NOTE — Progress Notes (Signed)
  Discharge summary [phone]

## 2012-06-16 NOTE — Plan of Care (Signed)
Problem: RH KNOWLEDGE DEFICIT Goal: RH STG INCREASE KNOWLEDGE OF HYPERTENSION Patient verbalizes understanding on the risk factors, diet, normal BP. Patient able to demonstrate when and how to check BP, compliant with medication management. Mod assist from family .  Outcome: Completed/Met Date Met:  06/16/12 Patient requires mod assist from family to understand and manage  medications .

## 2012-06-17 NOTE — Discharge Summary (Signed)
Meagan [name]          ACCOUNT NO.:  [id number]  MEDICAL RECORD NO.:  [id number]  LOCATION:  4025                         FACILITY:  [facility]  PHYSICIAN:  Ranelle Oyster, M.D.DATE OF BIRTH:  [birth year]  DATE OF ADMISSION:  06/07/2012 DATE OF DISCHARGE:  06/16/2012                              DISCHARGE SUMMARY   DISCHARGE DIAGNOSES: 1. Decondition due to urosepsis and pseudoexacerbation of Parkinson     symptoms. 2. Escherichia coli and Proteus urinary tract infection, treated. 3. Hypertension.  HISTORY OF PRESENT ILLNESS:  [name] is a 77 year old female with history of Parkinson disease, who was admitted via ED on June 04, 2012, with fevers and unresponsiveness due to E. coli and Proteus UTI.  She was treated with IV antibiotics with improvement in her symptoms.  She was changed over to p.o. Cipro with 7-day course, treatment recommended.  She was evaluated by Rehab team as was noted to be diffusely weak.  She was deemed to be a good candidate for CIR.  PAST MEDICAL HISTORY:   1. Hypertension. 2. Tremors. 3. Gallstones.  FUNCTIONAL HISTORY:  The patient was independent, but sedentary prior to admission.  Does not drive.  FUNCTIONAL STATUS:  The patient was mod-assist for bed mobility, min-to- mod-assist transfers, min-assist ambulating 80 feet with rolling walker, required verbal cues for safety, posture and step length.  HOSPITAL COURSE:  [name] was admitted to Rehab on June 07, 2012, for inpatient therapies to consist of PT, OT at least 3 hours 5 days a week.  Speech therapy was initiated as the patient was noted to have cognitive deficits and problems with memory and processing.  The patient's blood pressures were checked on b.i.d. basis, these were noted to be elevated.  She was started on lisinopril for BP control.  Blood pressures at time of discharge ranging from 120-140s systolic and 60-70s diastolic.  Labs done  past-admission revealed the patient to have hyperkalemia with potassium at 3.0, and this was supplemented during her stay.  CBC done, revealed hemoglobin 10.7, hematocrit 31.9, white count 4.9, platelets 210.  The patient has been continent of bowel and bladder with scheduled toileting.   During the patient's stay in Rehab, weekly team conferences were held to monitor the patient's progress, set goals, as well as discuss barriers to discharge.  OT has worked with the patient on self- care tasks as well as functional mobility with rolling walker and shower stall transfers.  The patient requires min-assist overall for transfers and BADLs, require supervision for toileting and toilet transfers. Speech Therapy has worked with the patient to maximize cognitive function as well as independence and overall safety.  The patient is at [facility] for functional problem solving, safety awareness, intellectual awareness of deficits as well as working memory with utilization of strategies.  She was demonstrating increased thought organization and verbal expression for wants and needs.  Physical Therapy has worked with the patient on strengthening and mobility.  The patient is at min-assist for transfers, min-assist for ambulating 100- 150 feet with rolling walker.  Due to cognitive and balance deficits, 24- hour supervision is recommended past-discharge.  Further followup home health, PT, OT, speech therapy to continue past-discharge.  On June 16, 2012, the patient is discharged to home.  DISCHARGE MEDICATIONS: 1. Lisinopril 5 mg p.o. per day. 2. Sinemet CR 25-10 1 p.o. t.i.d. 3. Metamucil 1 package per day.  DIET:  Regular.  ACTIVITY:  24-hour supervision due to safety concerns.  Walk with walker and assistance.  SPECIAL INSTRUCTIONS:  No alcohol.  [facility] to provide PT, OT, speech therapy.  FOLLOWUP:  The patient to follow up with Dr. Riley Kill as needed.  The patient to  follow up with Dr. Denton Meek later this month for routine check.  The patient and daughter have elected on getting input on referral to primary MD by Dr. Modesto Charon.  The patient will require recheck of blood pressure and potassium levels in the next few weeks.     Delle Reining, P.A.   ______________________________ Ranelle Oyster, M.D.    PL/MEDQ  D:  06/16/2012  T:  06/17/2012  Job:  [id number]  cc:   Denton Meek, MD

## 2012-06-19 ENCOUNTER — Telehealth: Payer: Self-pay | Admitting: Physical Medicine & Rehabilitation

## 2012-06-19 NOTE — Telephone Encounter (Signed)
D/c from hospital 06/16/12.  Per daughter's request, does not want start of care until 06/21/12 when other daughter from [place] arrives.

## 2012-06-19 NOTE — Progress Notes (Signed)
Daughter called office for BP med RX.  Apparently she misplaced discharge papers and RX. Called in RX for lisinopril to [facility] [place]. Will may additional copies of discharge instructions for daughter to pick up and add HHRN for BP check.  Again relayed need for patient to have PCP for follow up.

## 2012-06-27 DIAGNOSIS — Z5189 Encounter for other specified aftercare: Secondary | ICD-10-CM | POA: Diagnosis not present

## 2012-07-05 ENCOUNTER — Telehealth: Payer: Self-pay | Admitting: Physical Medicine & Rehabilitation

## 2012-07-05 NOTE — Telephone Encounter (Signed)
Verbal given to continue care.  Pts family got her medications and will monitor pt.

## 2012-07-05 NOTE — Telephone Encounter (Signed)
Patients BP elevated.  Family will have Lisinopril filled.  Please call daughter, Luster Landsberg, at [phone] with any further instructions.  Also, would like verbal to treat 3wk/2.

## 2012-07-07 ENCOUNTER — Telehealth: Payer: Self-pay | Admitting: Physical Medicine & Rehabilitation

## 2012-07-07 NOTE — Telephone Encounter (Signed)
Patient d/c'd to home with [facility] orders for PT, OT, Speech and RN.  Family has cx'd   PT, OT, ad RN, but allowing Speech.  FYI

## 2012-07-13 ENCOUNTER — Ambulatory Visit: Payer: No Typology Code available for payment source | Admitting: Neurology

## 2012-07-14 ENCOUNTER — Ambulatory Visit (INDEPENDENT_AMBULATORY_CARE_PROVIDER_SITE_OTHER): Payer: No Typology Code available for payment source | Admitting: Neurology

## 2012-07-14 ENCOUNTER — Encounter: Payer: Self-pay | Admitting: Neurology

## 2012-07-14 VITALS — BP 140/70 | HR 76 | Wt 160.0 lb

## 2012-07-14 DIAGNOSIS — G20 Parkinson's disease: Secondary | ICD-10-CM

## 2012-07-14 DIAGNOSIS — G20A1 Parkinson's disease without dyskinesia, without mention of fluctuations: Secondary | ICD-10-CM

## 2012-07-14 MED ORDER — CARBIDOPA-LEVODOPA 25-100 MG PO TABS: 1.0000 | ORAL_TABLET | Freq: Three times a day (TID) | ORAL | Status: DC

## 2012-07-14 MED ORDER — CARBIDOPA-LEVODOPA CR 25-100 MG PO TBCR: 1.0000 | EXTENDED_RELEASE_TABLET | Freq: Every day | ORAL | Status: DC

## 2012-07-16 NOTE — Progress Notes (Signed)
Dear Dr. Concepcion [name],   I saw Meagan [name] back in [facility] for her problem with Parkinson's disease. As you may recall, she is a 77 y.o. year old female with a history of over 1 year of tremor and slowness of gait.At her last visit I increased her Sinemet to 100/25 2 tabs tid.  Unfortunately she felt that this caused constipation, decreased appetite.  They decreased the Sinemet to 100/25 tid, but it was unclear whether things actually got better.  In the interim, she continues to be bothered by her mainly left sided tremor.  Her walking is somewhat slower and she was admitted in the interim for urosepsis.  She cannot endorse wearing off phenomenon during the day, but says when she gets out of bed in the a.m. she is "very shaky".  Denies falls.  She has not gotten physical therapy -- she does not want to go.  Not having bad dreams or difficulty sleeping.  Denies lightheadedness.   Medical history, social history, and family history were reviewed and have not changed since the last clinic visit excep as mentioned above  Current Outpatient Prescriptions on File Prior to Visit  Medication Sig Dispense Refill  . lisinopril (PRINIVIL,ZESTRIL) 5 MG tablet Take 1 tablet (5 mg total) by mouth daily.  30 tablet  1  . psyllium (METAMUCIL) 58.6 % packet Take 1 packet by mouth daily.        . Sinemet 100/25 IR takes tid  30 tablet  11   - takes Sinemet 100/25 IR at 9,2,7  No Known Allergies  ROS:  13 systems were reviewed and are unremarkable.  Exam: . Filed Vitals:   07/14/12 1426  BP: 140/70  Pulse: 76  Weight: 160 lb (72.576 kg)    In general, well nourished appearing.  Cranial Nerves:  Extraocular movements are intact without nystagmus.Muscles of facial expression are symmetric. Decreased blink frequency. Tongue protrusion, uvula, palate midline. Shoulder shrug leads on the right. She does have a head and jaw tremor.  Motor: Cogwheeling left > right. Postural and resting  tremor on left > right.  Reflexes: 2+ thoughout, except absent ankles.  Gait: Stooped gait and station. Cannot rise from a chair without using arms. Mild retropulsion. Turns slowly. Left hand tremor increased with walking.   Impression/Recommendations:  1.  Tremor predominant parkinson's - I have convinced her to try a 100/25 CR sinemet at bedtime to help her tremor(and possibly walking) in the am.  She is going to think about getting physical therapy as well.  She is considering moving back to [place].  If she does not she will follow up with Dr. Lurena Joiner Tat our movement disorders specialist.  Meagan [name]. Modesto Charon, MD [facility], 

## 2012-07-27 ENCOUNTER — Ambulatory Visit: Payer: No Typology Code available for payment source | Admitting: Neurology

## 2013-03-01 ENCOUNTER — Telehealth: Payer: Self-pay | Admitting: Neurology

## 2013-03-01 NOTE — Telephone Encounter (Signed)
The patient called and is hoping to become a pt of Dr.Tat's.  She states she saw Dr.Wong many years ago and he has told her to start coming back here with Dr.Tat.  Is this okay to schedule? Thanks!

## 2013-03-04 NOTE — Telephone Encounter (Signed)
That sounds just perfect.  Go ahead and make her an appt but put in new pt slot since I have not seen her yet.

## 2013-03-05 ENCOUNTER — Telehealth: Payer: Self-pay | Admitting: Neurology

## 2013-03-05 NOTE — Telephone Encounter (Signed)
Called pt's daughter to sch appt. [name] pt, OK per Dr. Arbutus Leas to schedule appt w/ her. Spoke w/ pt's daughter, appt has been scheduled / Sherri

## 2013-03-05 NOTE — Telephone Encounter (Signed)
Called and spoke with the patient's caregiver. Explained about an appointment for her with Dr. Arbutus Leas. The caregiver states that her daughter, Meagan [name], makes all of her mom's appointments and she will have her give [place] a call. I gave her the office number. She will give the information to her daughter.

## 2013-06-01 ENCOUNTER — Ambulatory Visit: Payer: No Typology Code available for payment source | Admitting: Neurology

## 2013-06-06 ENCOUNTER — Ambulatory Visit: Payer: No Typology Code available for payment source | Admitting: Neurology

## 2013-06-11 ENCOUNTER — Ambulatory Visit: Payer: No Typology Code available for payment source | Admitting: Neurology

## 2013-06-15 ENCOUNTER — Ambulatory Visit (INDEPENDENT_AMBULATORY_CARE_PROVIDER_SITE_OTHER): Payer: Medicare Other | Admitting: Neurology

## 2013-06-15 ENCOUNTER — Encounter: Payer: Self-pay | Admitting: Neurology

## 2013-06-15 VITALS — BP 132/78 | HR 60 | Temp 97.9°F | Resp 16 | Wt 144.0 lb

## 2013-06-15 DIAGNOSIS — F0390 Unspecified dementia without behavioral disturbance: Secondary | ICD-10-CM

## 2013-06-15 DIAGNOSIS — F0280 Dementia in other diseases classified elsewhere without behavioral disturbance: Secondary | ICD-10-CM

## 2013-06-15 DIAGNOSIS — R413 Other amnesia: Secondary | ICD-10-CM

## 2013-06-15 DIAGNOSIS — G20A1 Parkinson's disease without dyskinesia, without mention of fluctuations: Secondary | ICD-10-CM

## 2013-06-15 DIAGNOSIS — E876 Hypokalemia: Secondary | ICD-10-CM

## 2013-06-15 DIAGNOSIS — D649 Anemia, unspecified: Secondary | ICD-10-CM

## 2013-06-15 DIAGNOSIS — G20 Parkinson's disease: Secondary | ICD-10-CM

## 2013-06-15 LAB — VITAMIN B12: Vitamin B-12: 257 pg/mL (ref 211–911)

## 2013-06-15 LAB — COMPREHENSIVE METABOLIC PANEL
ALT: 12 U/L (ref 0–35)
AST: 13 U/L (ref 0–37)
Alkaline Phosphatase: 39 U/L (ref 39–117)
Creatinine, Ser: 0.7 mg/dL (ref 0.4–1.2)
Total Bilirubin: 0.8 mg/dL (ref 0.3–1.2)

## 2013-06-15 LAB — CBC WITH DIFFERENTIAL/PLATELET
Basophils Relative: 0.8 % (ref 0.0–3.0)
Eosinophils Absolute: 0 10*3/uL (ref 0.0–0.7)
Hemoglobin: 11.4 g/dL — ABNORMAL LOW (ref 12.0–15.0)
Lymphocytes Relative: 47.7 % — ABNORMAL HIGH (ref 12.0–46.0)
MCHC: 33.4 g/dL (ref 30.0–36.0)
Monocytes Relative: 7.4 % (ref 3.0–12.0)
Neutro Abs: 1.5 10*3/uL (ref 1.4–7.7)
RBC: 3.49 Mil/uL — ABNORMAL LOW (ref 3.87–5.11)

## 2013-06-15 LAB — TSH: TSH: 0.91 u[IU]/mL (ref 0.35–5.50)

## 2013-06-15 MED ORDER — DIAPERS & SUPPLIES MISC: Status: DC

## 2013-06-15 MED ORDER — CARBIDOPA-LEVODOPA 25-100 MG PO TABS: 1.5000 | ORAL_TABLET | Freq: Three times a day (TID) | ORAL | Status: DC

## 2013-06-15 MED ORDER — RIVASTIGMINE 4.6 MG/24HR TD PT24: 1.0000 | MEDICATED_PATCH | Freq: Every day | TRANSDERMAL | Status: DC

## 2013-06-15 NOTE — Patient Instructions (Addendum)
1.  Increase your carbidopa/levodopa 25/100:  1 1/2 tablets three times per day before meals 2.  Start the exelon patch:  4.6 mg daily.  Rotate sites.  You must bathe regularly as the patch site needs a clean surface to stick to 3.  You need to get started with physical therapy and I will send an order 4.  Use your walker at all times! 5.  I need you to get daily exercise in a safe manner and also work on daily mental exercise 6.  You need a primary care physician  Samples of Exelon patch  were given to the patient, quantity 21, Lot Number [phone]

## 2013-06-15 NOTE — Progress Notes (Signed)
Meagan [name] was seen today in the movement disorders clinic for neurologic consultation for her Parkinson's disease.  She previously saw Dr. Modesto Charon, but since he is no longer with the practice, I will be resuming her care.  She has not been seen here in almost a year.  She was last seen on 07/14/2012.   She was dx with PD by Dr. Modesto Charon in September of 2012 and placed on levodopa at that time.  At her last visit in August, 2013, Dr. Modesto Charon added the 25/100 CR at bedtime but she never took that.   The patient's daughter states that her sister was present last visit and told her mom not to take the medication.  The current daughter that she is living with his present today thinks that she definitely needs more medication. She takes the carbidopa/levodopa IR  25/100 three times per day.  The patient's daughter is also very concerned about her memory.  The details are below.  The patient does not length today.  Her daughter provides 24-hour per day care.  Sometimes, the patient does not recognize her daughter.  Specific Symptoms:  Tremor: yes (both hands, but R hand/foot more noticeable); daughter states that she has had tremor since at least 1999.  Voice: hypophonic Sleep: sleeps well  Vivid Dreams:  no  Acting out dreams:  no Wet Pillows: yes Postural symptoms:  yes  Falls?  no Bradykinesia symptoms: slow movements and difficulty getting out of a chair Loss of smell:  yes Loss of taste:  yes Urinary Incontinence:  yes (wears depends) Difficulty Swallowing:  no Handwriting, micrographia: yes Trouble with ADL's:  no  Trouble buttoning clothing: yes Depression:  no Memory changes:  yes (loses ability to speak sometimes, forgets daughter that she lives with, does not drive x 2 years/cook/or do finances) Hallucinations:  no  visual distortions: no N/V:  no Lightheaded:  no  Syncope: no Diplopia:  no Dyskinesia:  no   PREVIOUS MEDICATIONS: Sinemet  ALLERGIES:  No Known Allergies  CURRENT  MEDICATIONS:  Current Outpatient Prescriptions on File Prior to Visit  Medication Sig Dispense Refill  . carbidopa-levodopa (SINEMET) 25-100 MG per tablet Take 1 tablet by mouth 3 (three) times daily.  90 tablet  11  . carbidopa-levodopa (SINEMET CR) 25-100 MG per tablet Take 1 tablet by mouth at bedtime.  30 tablet  11  . lisinopril (PRINIVIL,ZESTRIL) 5 MG tablet Take 1 tablet (5 mg total) by mouth daily.  30 tablet  1   No current facility-administered medications on file prior to visit.    PAST MEDICAL HISTORY:   Past Medical History  Diagnosis Date  . Hypertension   . Tremor   . History of gallstones     PAST SURGICAL HISTORY:   Past Surgical History  Procedure Laterality Date  . Shoulder surgery    . Knee surgery    . Colonoscopy      SOCIAL HISTORY:   History   Social History  . Marital Status: Divorced    Spouse Name: N/A    Number of Children: 8  . Years of Education: N/A   Occupational History  . Retired    Social History Main Topics  . Smoking status: Never Smoker   . Smokeless tobacco: Never Used  . Alcohol Use: No  . Drug Use: No  . Sexually Active: Not on file   Other Topics Concern  . Not on file   Social History Narrative   1-2 caffeine drinks daily  FAMILY HISTORY:   No family status information on file.    ROS:  A complete 10 system review of systems was obtained and was unremarkable apart from what is mentioned above.  PHYSICAL EXAMINATION:    VITALS:   Filed Vitals:   06/15/13 1009  BP: 132/78  Pulse: 60  Temp: 97.9 F (36.6 C)  Resp: 16  Weight: 144 lb (65.318 kg)    GEN:  The patient appears stated age and is in NAD. HEENT:  Normocephalic, atraumatic.  The mucous membranes are moist. The superficial temporal arteries are without ropiness or tenderness. CV:  RRR Lungs:  CTAB Neck/HEME:  There are no carotid bruits bilaterally.  Neurological examination:  Orientation: A MoCA was performed today and the pt scored an  8/30.   Cranial nerves: There is good facial symmetry. Pupils are equal round and reactive to light bilaterally. Fundoscopic exam reveals clear margins bilaterally. Extraocular muscles are intact. The visual fields are full to confrontational testing. The speech is fluent and clear. Soft palate rises symmetrically and there is no tongue deviation. Hearing is intact to conversational tone. Sensation: Sensation is intact to light and pinprick throughout (facial, trunk, extremities). Vibration is decreased at the bilateral big toe but intact at the ankle bilaterally. There is no extinction with double simultaneous stimulation. There is no sensory dermatomal level identified. Motor: Strength is 5/5 in the bilateral upper and lower extremities.   Shoulder shrug is equal and symmetric.  There is no pronator drift. Deep tendon reflexes: Deep tendon reflexes are 1/4 at the bilateral biceps, triceps, brachioradialis, patella and absent at the bilateral achilles. Plantar responses are downgoing bilaterally.  Movement examination: Tone: There is increased tone in the right upper extremity.  Tone in the left upper extremity is normal, although she does exhibit gegenhalten.  Abnormal movements: There is a resting tremor in the right upper and right lower extremity.  There is a less constant resting tremor of the left upper extremity.  She does have a head and chin tremor.  Head tremor is in the "no" direction. Coordination:  There is mild  decremation with RAM's, seen primarily in the right upper extremity.  She does have some difficulty with heel taps in the right lower extremity.   Gait and Station: The patient has significant  difficulty arising out of a deep-seated chair without the use of the hands.  Even with the use of her hands, the patient has difficulty getting out of the chair.  The patient's stride length is markedly decreased.  She is postural instability.  She turns en bloc.  The patient has a positive   pull test.      LABS:  Lab Results  Component Value Date   WBC 4.9 06/08/2012   HGB 10.7* 06/08/2012   HCT 31.9* 06/08/2012   MCV 91.1 06/08/2012   PLT 210 06/08/2012   Lab Results  Component Value Date   TSH 0.289* 06/04/2012     Chemistry      Component Value Date/Time   NA 145 06/08/2012 0635   K 3.0* 06/08/2012 0635   CL 111 06/08/2012 0635   CO2 26 06/08/2012 0635   BUN 6 06/08/2012 0635   CREATININE 0.49* 06/08/2012 0635      Component Value Date/Time   CALCIUM 8.5 06/08/2012 0635   ALKPHOS 56 06/08/2012 0635   AST 12 06/08/2012 0635   ALT 9 06/08/2012 0635   BILITOT 0.3 06/08/2012 0635     No results found for  this basename: VITAMINB12     ASSESSMENT/PLAN:  1.  Parkinsonism.  I suspect that this does represent idiopathic Parkinson's disease.  The patient has tremor, bradykinesia, rigidity and postural instability.  -We discussed the diagnosis as well as pathophysiology of the disease.  We discussed treatment options as well as prognostic indicators.  Patient education was provided.  -Greater than 50% of the 80 minute visit was spent in counseling answering questions and talking about what to expect now as well as in the future.  We talked about medication options as well as potential future surgical options.  We talked about safety in the home.  -We decided to increase her carbidopa/levodopa 25/100 to 1-1/2 tablets 3 times a day.  I stressed the importance of her daughter administering medications.   -I will refer the patient [facility] the  [facility]  I talked to the patient about the fact that I think that she needs a walker at all times.  -Given her history, it is certainly possible that the patient had ET in the past, but she does exhibit parkinsonian features now. 2.  advanced dementia.  -I talked to the patient and her daughter about safety.  Her daughter is providing 24-hour per day care.  We're going to start her on Exelon patch, 4.6 mg daily.  We  talked about the importance of physical and mental exercises on a daily basis, done safely.  We talked about the importance of staying engaged during the daytime.    -Risks, benefits, side effects and alternative therapies were discussed.  The opportunity to ask questions was given and they were answered to the best of my ability.  The patient expressed understanding and willingness to follow the outlined treatment protocols.  -She will have lab work done today including TSH, CBC, CMP, B12, folate and RPR.  -I talked to her about getting a primary care physician and contact information was given.

## 2013-06-16 LAB — URINE CULTURE

## 2013-07-02 ENCOUNTER — Ambulatory Visit: Payer: Medicare Other | Attending: Neurology | Admitting: Physical Therapy

## 2013-07-02 DIAGNOSIS — IMO0001 Reserved for inherently not codable concepts without codable children: Secondary | ICD-10-CM | POA: Insufficient documentation

## 2013-07-02 DIAGNOSIS — R269 Unspecified abnormalities of gait and mobility: Secondary | ICD-10-CM | POA: Insufficient documentation

## 2013-07-06 ENCOUNTER — Ambulatory Visit: Payer: Medicare Other | Attending: Neurology | Admitting: Physical Therapy

## 2013-07-06 DIAGNOSIS — IMO0001 Reserved for inherently not codable concepts without codable children: Secondary | ICD-10-CM | POA: Insufficient documentation

## 2013-07-06 DIAGNOSIS — R269 Unspecified abnormalities of gait and mobility: Secondary | ICD-10-CM | POA: Insufficient documentation

## 2013-07-09 ENCOUNTER — Ambulatory Visit: Payer: Medicare Other | Admitting: Physical Therapy

## 2013-07-12 ENCOUNTER — Ambulatory Visit: Payer: Medicare Other | Admitting: Physical Therapy

## 2013-07-16 ENCOUNTER — Ambulatory Visit: Payer: Medicare Other | Admitting: Physical Therapy

## 2013-07-18 ENCOUNTER — Telehealth: Payer: Self-pay

## 2013-07-18 ENCOUNTER — Ambulatory Visit: Payer: Medicare Other | Admitting: Physical Therapy

## 2013-07-18 NOTE — Telephone Encounter (Signed)
I talked with her daughter some last visit about seroquel and its r/b/se.  See if they would like to try that instead:  25 mg - 1/2 to one tablet at night.

## 2013-07-18 NOTE — Telephone Encounter (Signed)
[name] calling Rx for exelon is $104 a month and she can't afford it.  Also her behavior has changed drastically over the past month.  She has become very combative and refuses to do anything.  Won't shower, lies all the time.  Her daughter is afraid she is going to call the police and them she is beating her.  She doesn't think there has been any benefit from the exelon and that along with the price thinks she should just stop it.

## 2013-07-19 MED ORDER — QUETIAPINE FUMARATE 25 MG PO TABS: ORAL_TABLET | ORAL | Status: DC

## 2013-07-19 NOTE — Telephone Encounter (Signed)
Spoke with [name], she initially said pt will not take a pill and that they will just wait until their f/u appt in Sept.  She called right back and said she would try and convince her to try it.

## 2013-07-23 ENCOUNTER — Telehealth: Payer: Self-pay

## 2013-07-23 NOTE — Telephone Encounter (Signed)
That would be an odd side effect, especially if it didn't happen before. They can hold it for a few days and then try and restart.

## 2013-07-23 NOTE — Telephone Encounter (Signed)
[name], pt's daughter, calling.  Pt c/o nausea on the half a Seroquel.  She has been taking since Thursday, but today is the first time she has had nausea.  She takes before bed, around 8:30.

## 2013-07-24 ENCOUNTER — Ambulatory Visit: Payer: Medicare Other | Admitting: Physical Therapy

## 2013-07-24 NOTE — Telephone Encounter (Signed)
Pt's daughter aware.  She will try this.

## 2013-07-27 ENCOUNTER — Ambulatory Visit: Payer: Medicare Other | Admitting: Physical Therapy

## 2013-07-31 ENCOUNTER — Ambulatory Visit: Payer: Medicare Other | Admitting: Physical Therapy

## 2013-08-02 ENCOUNTER — Ambulatory Visit: Payer: Medicare Other | Admitting: Physical Therapy

## 2013-08-03 ENCOUNTER — Encounter: Payer: Self-pay | Admitting: Internal Medicine

## 2013-08-03 ENCOUNTER — Ambulatory Visit (INDEPENDENT_AMBULATORY_CARE_PROVIDER_SITE_OTHER): Payer: Medicare Other | Admitting: Internal Medicine

## 2013-08-03 VITALS — BP 120/58 | HR 60 | Temp 97.9°F | Wt 141.0 lb

## 2013-08-03 DIAGNOSIS — R6889 Other general symptoms and signs: Secondary | ICD-10-CM

## 2013-08-03 DIAGNOSIS — R410 Disorientation, unspecified: Secondary | ICD-10-CM

## 2013-08-03 DIAGNOSIS — G20 Parkinson's disease: Secondary | ICD-10-CM

## 2013-08-03 LAB — POCT URINALYSIS DIPSTICK
Glucose, UA: NEGATIVE
Ketones, UA: NEGATIVE
Leukocytes, UA: NEGATIVE
Spec Grav, UA: 1.015

## 2013-08-03 NOTE — Addendum Note (Signed)
Addended by: Lyanne Co R on: 08/03/2013 12:00 PM   Modules accepted: Orders

## 2013-08-03 NOTE — Progress Notes (Signed)
Subjective:    Patient ID: Meagan [name], female    DOB: October 31, 1935, 78 y.o.   MRN: [id number]  HPI  Pt presents to the clinic today with c/o "sluggishness and disorientation". This started in the last few weeks. She thinks she may have a UTI, even though she denies urinary symptoms. She does have a history of parkinsons. She is on Sinemet for this. She does take her medication as prescribed. She is not taking the Seroquel. She reports that she had a bad reaction to one it, but can not remember what  It was. She does see Dr. Arbutus Leas for her parkinsons. She denies fever, chills or body aches.  Review of Systems      Past Medical History  Diagnosis Date  . Hypertension   . Tremor   . History of gallstones     Current Outpatient Prescriptions  Medication Sig Dispense Refill  . carbidopa-levodopa (SINEMET) 25-100 MG per tablet Take 1.5 tablets by mouth 3 (three) times daily.  140 tablet  5  . Diapers & Supplies MISC Use as directed  100 each  5  . Diapers & Supplies MISC Use as directed  100 each  5  . QUEtiapine (SEROQUEL) 25 MG tablet 1/2 to one po qhs  30 tablet  1  . rivastigmine (EXELON) 4.6 mg/24hr Place 1 patch (4.6 mg total) onto the skin daily.  30 patch  3   No current facility-administered medications for this visit.    No Known Allergies  Family History  Problem Relation Age of Onset  . Diabetes Father   . Colon cancer Neg Hx     History   Social History  . Marital Status: Divorced    Spouse Name: N/A    Number of Children: 8  . Years of Education: N/A   Occupational History  . Retired    Social History Main Topics  . Smoking status: Never Smoker   . Smokeless tobacco: Never Used  . Alcohol Use: No  . Drug Use: No  . Sexual Activity: Not on file   Other Topics Concern  . Not on file   Social History Narrative   1-2 caffeine drinks daily      Constitutional: Pt reports sluggishness. Denies fever, malaise, fatigue, headache or abrupt weight  changes.  Respiratory: Denies difficulty breathing, shortness of breath, cough or sputum production.   Cardiovascular: Denies chest pain, chest tightness, palpitations or swelling in the hands or feet.  Gastrointestinal: Denies abdominal pain, bloating, constipation, diarrhea or blood in the stool.  GU: Denies urgency, frequency, pain with urination, burning sensation, blood in urine, odor or discharge. Musculoskeletal: Denies decrease in range of motion, difficulty with gait, muscle pain or joint pain and swelling.  Neurological: Pt reports confusion. Denies dizziness, difficulty with speech or problems with balance and coordination.   No other specific complaints in a complete review of systems (except as listed in HPI above).  Objective:   Physical Exam   BP 120/58  Pulse 60  Temp(Src) 97.9 F (36.6 C) (Oral)  Wt 141 lb (63.957 kg)  BMI 19.67 kg/m2  SpO2 95% Wt Readings from Last 3 Encounters:  08/03/13 141 lb (63.957 kg)  06/15/13 144 lb (65.318 kg)  07/14/12 160 lb (72.576 kg)    General: Appears her stated age, chronically ill appearing in NAD. Cardiovascular: Normal rate and rhythm. S1,S2 noted.  No murmur, rubs or gallops noted. No JVD or BLE edema. No carotid bruits noted. Pulmonary/Chest: Normal effort and  positive vesicular breath sounds. No respiratory distress. No wheezes, rales or ronchi noted.  Abdomen: Soft and nontender. Normal bowel sounds, no bruits noted. No distention or masses noted. Liver, spleen and kidneys non palpable. Musculoskeletal: Normal range of motion. No signs of joint swelling. No difficulty with gait.  Neurological: Alert and oriented. Cranial nerves II-XII intact. Some difficulty with coordination and alternating movements.. Tremor noted. +DTRs bilaterally. Psychiatric: Mood and affect flat. Behavior is normal. Judgment and thought content normal.    BMET    Component Value Date/Time   NA 139 06/15/2013 1128   K 3.8 06/15/2013 1128   CL 108  06/15/2013 1128   CO2 27 06/15/2013 1128   GLUCOSE 86 06/15/2013 1128   BUN 13 06/15/2013 1128   CREATININE 0.7 06/15/2013 1128   CALCIUM 9.3 06/15/2013 1128   GFRNONAA >90 06/08/2012 0635   GFRAA >90 06/08/2012 0635    Lipid Panel  No results found for this basename: chol, trig, hdl, cholhdl, vldl, ldlcalc    CBC    Component Value Date/Time   WBC 3.5* 06/15/2013 1128   RBC 3.49* 06/15/2013 1128   HGB 11.4* 06/15/2013 1128   HCT 34.0* 06/15/2013 1128   PLT 209.0 06/15/2013 1128   MCV 97.5 06/15/2013 1128   MCH 30.6 06/08/2012 0635   MCHC 33.4 06/15/2013 1128   RDW 13.9 06/15/2013 1128   LYMPHSABS 1.7 06/15/2013 1128   MONOABS 0.3 06/15/2013 1128   EOSABS 0.0 06/15/2013 1128   BASOSABS 0.0 06/15/2013 1128    Hgb A1C No results found for this basename: HGBA1C        Assessment & Plan:   Sluggishness and confusion, likely related progression of parkinsons disease:  Will check urinalysis to r/o infection-negative I would like you to follow up with Dr. Arbutus Leas, consider adding Aricept or Namenda to treatment regimen but will defer to Dr. Arbutus Leas.  I will see you in 1 week for your new pt appointment

## 2013-08-03 NOTE — Assessment & Plan Note (Signed)
Seems to be progressing On sinemet-continue for now Consider adding aricept or namenda Will have you follow up with Dr. Arbutus Leas

## 2013-08-03 NOTE — Patient Instructions (Signed)
Parkinson's Disease Parkinson's disease is a disorder of the central nervous system, which includes the brain and spinal cord. A person with this disease slowly loses the ability to completely control body movements. Within the brain, there is a group of nerve cells (basal ganglia) that help control movement. The basal ganglia are damaged and do not work properly in a person with Parkinson's disease. In addition, the basal ganglia produce and use a brain chemical called dopamine. The dopamine chemical sends messages to other parts of the body to control and coordinate body movements. Dopamine levels are low in a person with Parkinson's disease. If the dopamine levels are low, then the body does not receive the correct messages it needs to move normally.  CAUSES  The exact reason why the basal ganglia get damaged is not known. Some medical researchers have thought that infection, genes, environment, and certain medicines may contribute to the cause.  SYMPTOMS   An early symptom of Parkinson's disease is often an uncontrolled shaking (tremor) of the hands. The tremor will often disappear when the affected hand is consciously used.  As the disease progresses, walking, talking, getting out of a chair, and new movements become more difficult.  Muscles get stiff and movements become slower.  Balance and coordination become harder.  Depression, trouble swallowing, urinary problems, constipation, and sleep problems can occur.  Later in the disease, memory and thought processes may deteriorate. DIAGNOSIS  There are no specific tests to diagnose Parkinson's disease. You may be referred to a neurologist for evaluation. Your caregiver will ask about your medical history, symptoms, and perform a physical exam. Blood tests and imaging tests of your brain may be performed to rule out other diseases. The imaging tests may include an MRI or a CT scan. TREATMENT  The goal of treatment is to relieve symptoms.  Medicines may be prescribed once the symptoms become troublesome. Medicine will not stop the progression of the disease, but medicine can make movement and balance better and help control tremors. Speech and occupational therapy may also be prescribed. Sometimes, surgical treatment of the brain can be done in young people. HOME CARE INSTRUCTIONS  Get regular exercise and rest periods during the day to help prevent exhaustion and depression.  If getting dressed becomes difficult, replace buttons and zippers with Velcro and elastic on your clothing.  Take all medicine as directed by your caregiver.  Install grab bars or railings in your home to prevent falls.  Go to speech or occupational therapy as directed.  Keep all follow-up visits as directed by your caregiver. SEEK MEDICAL CARE IF:  Your symptoms are not controlled with your medicine.  You fall.  You have trouble swallowing or choke on your food. MAKE SURE YOU:  Understand these instructions.  Will watch your condition.  Will get help right away if you are not doing well or get worse. Document Released: 11/19/2000 Document Revised: 05/23/2012 Document Reviewed: 12/22/2011 [employer] Patient Information 2014 [employer].  

## 2013-08-07 ENCOUNTER — Ambulatory Visit: Payer: Medicare Other | Admitting: Internal Medicine

## 2013-08-08 ENCOUNTER — Ambulatory Visit: Payer: Medicare Other | Admitting: Physical Therapy

## 2013-08-08 ENCOUNTER — Ambulatory Visit: Payer: Medicare Other | Admitting: Occupational Therapy

## 2013-08-10 ENCOUNTER — Encounter: Payer: Self-pay | Admitting: Internal Medicine

## 2013-08-10 ENCOUNTER — Ambulatory Visit (INDEPENDENT_AMBULATORY_CARE_PROVIDER_SITE_OTHER): Payer: Medicare Other | Admitting: Internal Medicine

## 2013-08-10 VITALS — BP 150/82 | HR 84 | Temp 98.2°F | Wt 142.0 lb

## 2013-08-10 DIAGNOSIS — G20 Parkinson's disease: Secondary | ICD-10-CM | POA: Insufficient documentation

## 2013-08-10 DIAGNOSIS — R030 Elevated blood-pressure reading, without diagnosis of hypertension: Secondary | ICD-10-CM

## 2013-08-10 DIAGNOSIS — G20A1 Parkinson's disease without dyskinesia, without mention of fluctuations: Secondary | ICD-10-CM | POA: Insufficient documentation

## 2013-08-10 NOTE — Patient Instructions (Signed)

## 2013-08-10 NOTE — Assessment & Plan Note (Signed)
Continue to follow with Dr. Arbutus Leas

## 2013-08-10 NOTE — Progress Notes (Signed)
HPI  Pt presents to the clinic today to establish care. She did not have a PCP prior to coming here. She does see Dr. Arbutus Leas for her parkinsons. She recently had labs done in July 2014. She is accompanied by her daughter who is her main caregiver. She has no concerns today.  Flu: never Tetanus: never Pneumovax: never Pap smear: no longer screening Mammogram: no longer screening Colonoscopy: 2012- no longer wishes to screen Eye doctor: as needed Dentist: as needed  Past Medical History  Diagnosis Date  . Hypertension   . Tremor   . History of gallstones   . H/O urinary tract infection   . Urine incontinence   . Parkinson disease     Current Outpatient Prescriptions  Medication Sig Dispense Refill  . carbidopa-levodopa (SINEMET) 25-100 MG per tablet Take 1.5 tablets by mouth 3 (three) times daily.  140 tablet  5   No current facility-administered medications for this visit.    No Known Allergies  Family History  Problem Relation Age of Onset  . Diabetes Father   . Colon cancer Neg Hx   . Birth defects Neg Hx   . Heart disease Neg Hx   . Stroke Neg Hx     History   Social History  . Marital Status: Divorced    Spouse Name: N/A    Number of Children: 8  . Years of Education: N/A   Occupational History  . Retired    Social History Main Topics  . Smoking status: Never Smoker   . Smokeless tobacco: Never Used  . Alcohol Use: No  . Drug Use: No  . Sexual Activity: No   Other Topics Concern  . Not on file   Social History Narrative   1-2 caffeine drinks daily     ROS:  Constitutional: Denies fever, malaise, fatigue, headache or abrupt weight changes.  HEENT: Denies eye pain, eye redness, ear pain, ringing in the ears, wax buildup, runny nose, nasal congestion, bloody nose, or sore throat. Respiratory: Denies difficulty breathing, shortness of breath, cough or sputum production.   Cardiovascular: Denies chest pain, chest tightness, palpitations or swelling in  the hands or feet.  Gastrointestinal: Denies abdominal pain, bloating, constipation, diarrhea or blood in the stool.  GU: Denies frequency, urgency, pain with urination, blood in urine, odor or discharge. Musculoskeletal: Denies decrease in range of motion, difficulty with gait, muscle pain or joint pain and swelling.  Skin: Denies redness, rashes, lesions or ulcercations.  Neurological: Pt reports tremor. Denies dizziness, difficulty with memory, difficulty with speech or problems with balance and coordination.   No other specific complaints in a complete review of systems (except as listed in HPI above).  PE:  BP 150/82  Pulse 84  Temp(Src) 98.2 F (36.8 C) (Oral)  Wt 142 lb (64.411 kg)  BMI 19.81 kg/m2  SpO2 96% Wt Readings from Last 3 Encounters:  08/10/13 142 lb (64.411 kg)  08/03/13 141 lb (63.957 kg)  06/15/13 144 lb (65.318 kg)    General: Appears her stated age, well developed, well nourished in NAD. HEENT: Head: normal shape and size; Eyes: sclera white, no icterus, conjunctiva pink, PERRLA and EOMs intact; Ears: Tm's gray and intact, normal light reflex; Nose: mucosa pink and moist, septum midline; Throat/Mouth: Teeth present, mucosa pink and moist, no lesions or ulcerations noted.  Neck: Normal range of motion. Neck supple, trachea midline. No massses, lumps or thyromegaly present.  Cardiovascular: Normal rate and rhythm. S1,S2 noted.  No murmur, rubs  or gallops noted. No JVD or BLE edema. No carotid bruits noted. Pulmonary/Chest: Normal effort and positive vesicular breath sounds. No respiratory distress. No wheezes, rales or ronchi noted.  Abdomen: Soft and nontender. Normal bowel sounds, no bruits noted. No distention or masses noted. Liver, spleen and kidneys non palpable. Musculoskeletal: Normal range of motion. No signs of joint swelling. No difficulty with gait.  Neurological: Alert and oriented. Cranial nerves II-XII intact. Coordination abnormal. + tremor. +DTRs  bilaterally. Psychiatric: Mood flat with wooden facies. Behavior is normal. Judgment and thought content normal.     BMET    Component Value Date/Time   NA 139 06/15/2013 1128   K 3.8 06/15/2013 1128   CL 108 06/15/2013 1128   CO2 27 06/15/2013 1128   GLUCOSE 86 06/15/2013 1128   BUN 13 06/15/2013 1128   CREATININE 0.7 06/15/2013 1128   CALCIUM 9.3 06/15/2013 1128   GFRNONAA >90 06/08/2012 0635   GFRAA >90 06/08/2012 0635    Lipid Panel  No results found for this basename: chol, trig, hdl, cholhdl, vldl, ldlcalc    CBC    Component Value Date/Time   WBC 3.5* 06/15/2013 1128   RBC 3.49* 06/15/2013 1128   HGB 11.4* 06/15/2013 1128   HCT 34.0* 06/15/2013 1128   PLT 209.0 06/15/2013 1128   MCV 97.5 06/15/2013 1128   MCH 30.6 06/08/2012 0635   MCHC 33.4 06/15/2013 1128   RDW 13.9 06/15/2013 1128   LYMPHSABS 1.7 06/15/2013 1128   MONOABS 0.3 06/15/2013 1128   EOSABS 0.0 06/15/2013 1128   BASOSABS 0.0 06/15/2013 1128    Hgb A1C No results found for this basename: HGBA1C     Assessment and Plan:   Health maintenance:  Pt declines all screening Lab work done by Dr. Arbutus Leas reviewed today

## 2013-08-10 NOTE — Assessment & Plan Note (Signed)
Recheck 138/74 Continue to monitor for now

## 2013-08-13 ENCOUNTER — Ambulatory Visit: Payer: Medicare Other | Admitting: Physical Therapy

## 2013-08-14 ENCOUNTER — Ambulatory Visit: Payer: Medicare Other | Admitting: Occupational Therapy

## 2013-08-14 ENCOUNTER — Ambulatory Visit: Payer: Medicare Other | Admitting: Physical Therapy

## 2013-08-20 ENCOUNTER — Ambulatory Visit: Payer: Medicare Other | Admitting: Neurology

## 2013-08-24 ENCOUNTER — Ambulatory Visit (INDEPENDENT_AMBULATORY_CARE_PROVIDER_SITE_OTHER): Payer: Medicare Other | Admitting: Neurology

## 2013-08-24 VITALS — BP 128/62 | HR 60 | Temp 98.2°F | Resp 20 | Wt 140.3 lb

## 2013-08-24 DIAGNOSIS — G20 Parkinson's disease: Secondary | ICD-10-CM

## 2013-08-24 DIAGNOSIS — F0280 Dementia in other diseases classified elsewhere without behavioral disturbance: Secondary | ICD-10-CM

## 2013-08-24 MED ORDER — CARBIDOPA-LEVODOPA ER 50-200 MG PO TBCR: 1.0000 | EXTENDED_RELEASE_TABLET | Freq: Every day | ORAL | Status: AC

## 2013-08-24 MED ORDER — CARBIDOPA-LEVODOPA 25-100 MG PO TABS: 1.5000 | ORAL_TABLET | Freq: Three times a day (TID) | ORAL | Status: AC

## 2013-08-24 NOTE — Patient Instructions (Addendum)
1.  Take your carbidopa/levodopa 25/100 at the following times 8am/12noon/4 pm and then start the carbidopa/levodopa 50/200 CR at night 2.  Please make a follow up appointment for January 2015. Thank You!

## 2013-08-24 NOTE — Progress Notes (Signed)
Meagan [name] was seen today in the movement disorders clinic for neurologic consultation for her Parkinson's disease.  She previously saw Dr. Modesto Charon, but since he is no longer with the practice, I will be resuming her care.  She has not been seen here in almost a year.  She was last seen on 07/14/2012.   She was dx with PD by Dr. Modesto Charon in September of 2012 and placed on levodopa at that time.  At her last visit in August, 2013, Dr. Modesto Charon added the 25/100 CR at bedtime but she never took that.   The patient's daughter states that her sister was present last visit and told her mom not to take the medication.  The current daughter that she is living with his present today thinks that she definitely needs more medication. She takes the carbidopa/levodopa IR  25/100 three times per day.   08/24/13 update:  The patient presents today in followup with her daughter, who supplements the history.  The patient has a history of Parkinson's disease.  Last visit her carbidopa/levodopa 25/100 was increased to 1-1/2 tablets 3 times per day.  Her daughter thinks that she does fairly well during the daytime, but has significant difficulty in the morning getting "on."    The patient has advanced dementia and was on the Exelon patch 4.6 mg. Her daughter states that it was too expensive and it was d/c.    Her daughter called with increasing combativeness and agitation and we added Seroquel.  She tried and did well for a few days, but then called with complaining of one episode of nausea.  I did not think it was related to the medication, and asked her to hold it for a few days and then retry it.  She never did this.  Her daughter reports that she is a full-time caregiver for the patient and she is having progressively more difficulty caring for her mother.  Patient is having hallucinations.  She had lab work since last visit.  Her B12 was borderline at 257.  White blood cells were also mildly low at 3.5 and hemoglobin was mildly  low at 11.5.  Her TSH was normal.  Her RPR was negative.   PREVIOUS MEDICATIONS: Sinemet  ALLERGIES:  No Known Allergies  CURRENT MEDICATIONS:  Current Outpatient Prescriptions on File Prior to Visit  Medication Sig Dispense Refill  . carbidopa-levodopa (SINEMET) 25-100 MG per tablet Take 1.5 tablets by mouth 3 (three) times daily.  140 tablet  5   No current facility-administered medications on file prior to visit.    PAST MEDICAL HISTORY:   Past Medical History  Diagnosis Date  . Hypertension   . Tremor   . History of gallstones   . H/O urinary tract infection   . Urine incontinence   . Parkinson disease     PAST SURGICAL HISTORY:   Past Surgical History  Procedure Laterality Date  . Shoulder surgery    . Knee surgery    . Colonoscopy      SOCIAL HISTORY:   History   Social History  . Marital Status: Divorced    Spouse Name: N/A    Number of Children: 8  . Years of Education: N/A   Occupational History  . Retired    Social History Main Topics  . Smoking status: Never Smoker   . Smokeless tobacco: Never Used  . Alcohol Use: No  . Drug Use: No  . Sexual Activity: No   Other Topics Concern  .  Not on file   Social History Narrative   1-2 caffeine drinks daily     FAMILY HISTORY:   Family Status  Relation Status Death Age  . Father Deceased     unknown cause of death  . Mother Deceased     unknown cause of death  . Sister Alive     1, healthy  . Brother Deceased     3  . Brother Alive     renal failure  . Child Alive     8, healthy    ROS:  A complete 10 system review of systems was obtained and was unremarkable apart from what is mentioned above.  PHYSICAL EXAMINATION:    VITALS:   There were no vitals filed for this visit.  GEN:  The patient appears stated age and is in NAD. HEENT:  Normocephalic, atraumatic.  The mucous membranes are moist. The superficial temporal arteries are without ropiness or tenderness. CV:  RRR Lungs:   CTAB Neck/HEME:  There are no carotid bruits bilaterally.  Neurological examination:  Orientation: A MoCA was performed today and the pt scored an 8/30.   Cranial nerves: There is good facial symmetry. Pupils are equal round and reactive to light bilaterally. Fundoscopic exam reveals clear margins bilaterally. Extraocular muscles are intact. The visual fields are full to confrontational testing. The speech is fluent and clear. Soft palate rises symmetrically and there is no tongue deviation. Hearing is intact to conversational tone. Sensation: Sensation is intact to light and pinprick throughout (facial, trunk, extremities). Vibration is decreased at the bilateral big toe but intact at the ankle bilaterally. There is no extinction with double simultaneous stimulation. There is no sensory dermatomal level identified. Motor: Strength is 5/5 in the bilateral upper and lower extremities.   Shoulder shrug is equal and symmetric.  There is no pronator drift. Deep tendon reflexes: Deep tendon reflexes are 1/4 at the bilateral biceps, triceps, brachioradialis, patella and absent at the bilateral achilles. Plantar responses are downgoing bilaterally.  Movement examination: Tone: There is increased tone in the right upper extremity.  Tone in the left upper extremity is normal, although she does exhibit gegenhalten.  Abnormal movements: There is a resting tremor in the right upper and right lower extremity.  There is a less constant resting tremor of the left upper extremity.  She does have a head and chin tremor.  Head tremor is in the "no" direction. Coordination:  There is mild  decremation with RAM's, seen primarily in the right upper extremity.  She does have some difficulty with heel taps in the right lower extremity.   Gait and Station: The patient has significant  difficulty arising out of a deep-seated chair without the use of the hands.  Even with the use of her hands, the patient has difficulty  getting out of the chair.  The patient's stride length is markedly decreased.  She is postural instability.  She turns en bloc.  The patient has a positive  pull test.      LABS:  Lab Results  Component Value Date   WBC 3.5* 06/15/2013   HGB 11.4* 06/15/2013   HCT 34.0* 06/15/2013   MCV 97.5 06/15/2013   PLT 209.0 06/15/2013   Lab Results  Component Value Date   TSH 0.91 06/15/2013     Chemistry      Component Value Date/Time   NA 139 06/15/2013 1128   K 3.8 06/15/2013 1128   CL 108 06/15/2013 1128   CO2  27 06/15/2013 1128   BUN 13 06/15/2013 1128   CREATININE 0.7 06/15/2013 1128      Component Value Date/Time   CALCIUM 9.3 06/15/2013 1128   ALKPHOS 39 06/15/2013 1128   AST 13 06/15/2013 1128   ALT 12 06/15/2013 1128   BILITOT 0.8 06/15/2013 1128     Lab Results  Component Value Date   VITAMINB12 257 06/15/2013     ASSESSMENT/PLAN:  1.  Parkinsonism.  I suspect that this does represent idiopathic Parkinson's disease.  The patient has tremor, bradykinesia, rigidity and postural instability.  -We discussed the diagnosis as well as pathophysiology of the disease.  We discussed treatment options as well as prognostic indicators.  Patient education was provided.  -We decided to continue her carbidopa/levodopa 25/100 to 1-1/2 tablets 3 times a day I asked her daughter to move these dosages closer together and give them to her at 8 AM/12 PM/4 PM.  Then, carbidopa/levodopa 50/200 CR will be added at bed time.  I stressed the importance of her daughter administering medications.    2.  advanced dementia.  -I talked to the patient and her daughter about safety.  Greater than 50% of our 40 minute visit was spent in counseling. Her daughter is providing 24-hour per day care.  She is needing help, but unfortunately she does not need skilled nursing or meets criteria for home health.  She has no long-term care insurance.  I talked to them about the possibility of hospice and we can offer them.   Patient refused and even stated that if her heart quit beating, she would want full measures.  I am not sure that the patient is accompanied to make these decisions, but her daughter is going to abide by the pts feelings today.  -The patient has had significant agitation and hallucinations.  I do not think that the one day of nausea was from the Seroquel.  Her daughter would like the patient to try this again, and the patient was agreeable.  I told her to take half tablet at bedtime every night, and she can add an additional half tablet in the day if needed.  We did talk about the fact that the atypical antipsychotic medications are not indicated for dementia related psychosis and increased risk of mortality in the elderly, usually because of infectious or  cardiac related. Understanding is expressed and they (including her daughter) were agreeable that the benefits outweigh the risks in this case.  3.  No Follow-up on file.

## 2013-11-23 ENCOUNTER — Encounter: Payer: Self-pay | Admitting: Neurology

## 2013-11-23 ENCOUNTER — Ambulatory Visit (INDEPENDENT_AMBULATORY_CARE_PROVIDER_SITE_OTHER): Payer: Medicare Other | Admitting: Neurology

## 2013-11-23 VITALS — BP 136/68 | HR 60 | Temp 98.2°F | Resp 12 | Ht 70.0 in | Wt 135.6 lb

## 2013-11-23 DIAGNOSIS — F0280 Dementia in other diseases classified elsewhere without behavioral disturbance: Secondary | ICD-10-CM

## 2013-11-23 DIAGNOSIS — G20 Parkinson's disease: Secondary | ICD-10-CM

## 2013-11-23 NOTE — Patient Instructions (Signed)
1.  It is time to look for other forms of OUT OF THE HOME care, such as a subacute nursing facility (nursing home).  Look at them now and get put on the waiting list 2.  You cannot be home alone 3.  Allow the caregivers to do your bathing and cooking 4.  Have your son call here if you would like me to talk with him 5.  Take seroquel - 1/2 tablet twice per day

## 2013-11-23 NOTE — Progress Notes (Signed)
GIRL SCHISSLER was seen today in the movement disorders clinic for neurologic consultation for her Parkinson's disease.  She previously saw Dr. Modesto Charon, but since he is no longer with the practice, I will be resuming her care.  She has not been seen here in almost a year.  She was last seen on 07/14/2012.   She was dx with PD by Dr. Modesto Charon in September of 2012 and placed on levodopa at that time.  At her last visit in August, 2013, Dr. Modesto Charon added the 25/100 CR at bedtime but she never took that.   The patient's daughter states that her sister was present last visit and told her mom not to take the medication.  The current daughter that she is living with his present today thinks that she definitely needs more medication. She takes the carbidopa/levodopa IR  25/100 three times per day.   08/24/13 update:  The patient presents today in followup with her daughter, who supplements the history.  The patient has a history of Parkinson's disease.  Last visit her carbidopa/levodopa 25/100 was increased to 1-1/2 tablets 3 times per day.  Her daughter thinks that she does fairly well during the daytime, but has significant difficulty in the morning getting "on."    The patient has advanced dementia and was on the Exelon patch 4.6 mg. Her daughter states that it was too expensive and it was d/c.    Her daughter called with increasing combativeness and agitation and we added Seroquel.  She tried and did well for a few days, but then called with complaining of one episode of nausea.  I did not think it was related to the medication, and asked her to hold it for a few days and then retry it.  She never did this.  Her daughter reports that she is a full-time caregiver for the patient and she is having progressively more difficulty caring for her mother.  Patient is having hallucinations.  She had lab work since last visit.  Her B12 was borderline at 257.  White blood cells were also mildly low at 3.5 and hemoglobin was mildly  low at 11.5.  Her TSH was normal.  Her RPR was negative.  11/23/13 update:  This patient is accompanied in the office by her child who supplements the history.  She is currently on carbidopa/levodopa 25/100, 1-1/2 tablets 3 times per day.  Last visit, we restarted her Seroquel.  The pt took it for 3 days and then the pt said her stomach was upset and she d/c it.  She did well during those 3 days.  She has been more confused.  She wandered out of the house.  Her daughter wants to go back to work and her daughter is the primary caregiver.  Her daughter is "done" and cannot caregive any longer.  They have a paid caregiver but the pt refuses to let her cook, clean or bathe.  Her son is the POA but lives 800 miles away.  Her daughter is very frustrated.    PREVIOUS MEDICATIONS: Sinemet  ALLERGIES:  No Known Allergies  CURRENT MEDICATIONS:  Current Outpatient Prescriptions on File Prior to Visit  Medication Sig Dispense Refill  . carbidopa-levodopa (SINEMET CR) 50-200 MG per tablet Take 1 tablet by mouth at bedtime.  30 tablet  5  . carbidopa-levodopa (SINEMET) 25-100 MG per tablet Take 1.5 tablets by mouth 3 (three) times daily.  140 tablet  5   No current facility-administered medications on file prior  to visit.    PAST MEDICAL HISTORY:   Past Medical History  Diagnosis Date  . Hypertension   . Tremor   . History of gallstones   . H/O urinary tract infection   . Urine incontinence   . Parkinson disease     PAST SURGICAL HISTORY:   Past Surgical History  Procedure Laterality Date  . Shoulder surgery    . Knee surgery    . Colonoscopy      SOCIAL HISTORY:   History   Social History  . Marital Status: Divorced    Spouse Name: N/A    Number of Children: 8  . Years of Education: N/A   Occupational History  . Retired    Social History Main Topics  . Smoking status: Never Smoker   . Smokeless tobacco: Never Used  . Alcohol Use: No  . Drug Use: No  . Sexual Activity: No    Other Topics Concern  . Not on file   Social History Narrative   1-2 caffeine drinks daily     FAMILY HISTORY:   Family Status  Relation Status Death Age  . Father Deceased     unknown cause of death  . Mother Deceased     unknown cause of death  . Sister Alive     1, healthy  . Brother Deceased     3  . Brother Alive     renal failure  . Child Alive     8, healthy    ROS:  A complete 10 system review of systems was obtained and was unremarkable apart from what is mentioned above.  PHYSICAL EXAMINATION:    VITALS:   Filed Vitals:   11/23/13 1054  BP: 136/68  Pulse: 60  Temp: 98.2 F (36.8 C)  Resp: 12  Height: 5\' 10"  (1.778 m)  Weight: 135 lb 9.6 oz (61.508 kg)    GEN:  The patient appears stated age and is in NAD. HEENT:  Normocephalic, atraumatic.  The mucous membranes are moist. The superficial temporal arteries are without ropiness or tenderness. CV:  RRR Lungs:  CTAB Neck/HEME:  There are no carotid bruits bilaterally.  Neurological examination:  Orientation: A MoCA was performed previously and the pt scored an 8/30.  She scored a 1/4 on the clock drawing today.  She knows Christmas is coming up and knows that the next year is 2015 but when asked to write the date, she cannot. Cranial nerves: There is good facial symmetry. Pupils are equal round and reactive to light bilaterally. Fundoscopic exam reveals clear margins bilaterally. Extraocular muscles are intact. The visual fields are full to confrontational testing. The speech is fluent and clear. Soft palate rises symmetrically and there is no tongue deviation. Hearing is intact to conversational tone. Sensation: Sensation is intact to light touch throughout. Motor: Strength is 5/5 in the bilateral upper and lower extremities.   Shoulder shrug is equal and symmetric.  There is no pronator drift.   Movement examination: Tone: There is increased tone in the right upper extremity.  Tone in the left upper  extremity is normal, although she does exhibit gegenhalten.  Abnormal movements: There is a resting tremor in the right upper and right lower extremity.  There is no left upper extremity tremor today.   Coordination:  There is mild  decremation with RAM's, seen primarily in the right upper extremity.  She does have some difficulty with heel taps in the right lower extremity.   Gait and  Station: The patient has very little trouble getting up today.  Gait is just mildly short stepped.   LABS:  Lab Results  Component Value Date   WBC 3.5* 06/15/2013   HGB 11.4* 06/15/2013   HCT 34.0* 06/15/2013   MCV 97.5 06/15/2013   PLT 209.0 06/15/2013   Lab Results  Component Value Date   TSH 0.91 06/15/2013     Chemistry      Component Value Date/Time   NA 139 06/15/2013 1128   K 3.8 06/15/2013 1128   CL 108 06/15/2013 1128   CO2 27 06/15/2013 1128   BUN 13 06/15/2013 1128   CREATININE 0.7 06/15/2013 1128      Component Value Date/Time   CALCIUM 9.3 06/15/2013 1128   ALKPHOS 39 06/15/2013 1128   AST 13 06/15/2013 1128   ALT 12 06/15/2013 1128   BILITOT 0.8 06/15/2013 1128     Lab Results  Component Value Date   VITAMINB12 257 06/15/2013     ASSESSMENT/PLAN:  1.  Parkinsonism.  I suspect that this does represent idiopathic Parkinson's disease.  The patient has tremor, bradykinesia, rigidity and postural instability.  -We discussed the diagnosis as well as pathophysiology of the disease.  We discussed treatment options as well as prognostic indicators.  Patient education was provided.  -We decided to continue her carbidopa/levodopa 25/100 to 1-1/2 tablets 3 times a day I asked her daughter to move these dosages closer together and give them to her at 8 AM/12 PM/4 PM.  Then, carbidopa/levodopa 50/200 CR will be added at bed time.  I stressed the importance of her daughter administering medications.    2.  advanced dementia.  -I talked to the patient and her daughter about safety.  Greater than 50% of  our 40 minute visit was spent in counseling. Her daughter feels that she can no longer provide care.  Her daughter needs to go back to work.  Her daughter does plan on sending the patient back to [place], but family care is not available here.  We talked about nursing homes.  We talked about how to go about finding a good nursing home.  In the meantime, I spent a long time with the patient discussing the importance of taking her medication and allowing her caregivers to do their jobs.  I did tell the patient and her daughter that she cannot be left alone.  -The patient has had significant agitation and hallucinations.  I do not think that the one day of nausea was from the Seroquel.  Her daughter would like the patient to try this again, and the patient was agreeable.  I told her to take half tablet at bedtime every night, and she can add an additional half tablet in the day if needed.  We did talk about the fact that the atypical antipsychotic medications are not indicated for dementia related psychosis and increased risk of mortality in the elderly, usually because of infectious or  cardiac related. Understanding is expressed and they (including her daughter) were agreeable that the benefits outweigh the risks in this case.  -I asked them to have her son, the POA, call me.

## 2013-12-07 ENCOUNTER — Ambulatory Visit: Payer: Medicare Other | Admitting: Neurology

## 2013-12-10 ENCOUNTER — Telehealth: Payer: Self-pay | Admitting: Neurology

## 2013-12-10 ENCOUNTER — Telehealth: Payer: Self-pay

## 2013-12-10 NOTE — Telephone Encounter (Signed)
Is she taking the seroquel?  Did they look into long term care facilities in [place] like they were going to?

## 2013-12-10 NOTE — Telephone Encounter (Signed)
Pt is hallucinating and very confused at times. She gets very upset when ever she is left alone, but calms down as soon as her daughter returns home. Daughter is not comfortable leaving her alone anymore so she is working on getting more help with her mother. She is going to keep a journal of her mother's hallucinations, when they happen, to see if there is a pattern to them. She will call us back in a week to update on pt's condition.

## 2013-12-10 NOTE — Telephone Encounter (Signed)
Symptoms seem to be worse w/ regards to her dementia. Increased confusion and hallucinations.Please return call to daughter # [phone] / Oneita KrasSherri S.

## 2013-12-12 ENCOUNTER — Telehealth: Payer: Self-pay

## 2013-12-12 NOTE — Telephone Encounter (Signed)
Called pt's daughter and she said her mother is still taking 1/2 a tab of Seroquel in the morning and 1/2 a tab at bedtime. Should she keep taking it? Also the pt is not moving to [place] anytime soon. When do you want her to come in for a follow up?

## 2013-12-13 NOTE — Telephone Encounter (Signed)
Increase the seroquel to 1 tablet bid.  Let me know if SE/too sleepy

## 2013-12-13 NOTE — Telephone Encounter (Signed)
Called pt and left a voicemail relaying your message.

## 2013-12-13 NOTE — Telephone Encounter (Signed)
3-4 months.  I just saw her 2 weeks ago.

## 2013-12-13 NOTE — Telephone Encounter (Signed)
I called pt's daughter and she agreed to give her mother 1 tab of seroquel bid. She asked again when you want pt to come in for a follow up?

## 2013-12-24 ENCOUNTER — Ambulatory Visit: Payer: Medicare Other | Admitting: Neurology

## 2013-12-28 ENCOUNTER — Telehealth: Payer: Self-pay | Admitting: Neurology

## 2013-12-28 NOTE — Telephone Encounter (Signed)
Tried to call daughter back with no answer and no option to leave a message. Will try again later.

## 2013-12-28 NOTE — Telephone Encounter (Signed)
We don't have a Child psychotherapistsocial worker.  However, if they want to get her in somewhere local, they need to tour facilities without an appt first and see how they look.  However, given that the son in [place] is POA, my suggestion is that she should be closer to him and they should look at facilites there.  Do we need to get adult protective services involved?

## 2013-12-28 NOTE — Telephone Encounter (Signed)
Patient's daughter called and states patient is uncontrollable. She is taking the Seroquel as directed and is on her Carbidopa-Levodopa. She is asking for assistance to get her in a facility. Per telephone note from 12/10/2013 they were to be looking at long term care facilities in [place]. They have not decided on a place - she was talking about the patient going to [place] or [place]. She was talking all over and not making a lot of sense. I tried to find out what we could do on our end to help. She is asking if there is another medication to add to make her more "controllable". I advised we would probably need to see her back. She had calmed down at that point and stated she would look into care facilities and call us back if needed.

## 2013-12-28 NOTE — Telephone Encounter (Signed)
Called back and Patient answered. She states her daughter is not with her. She states she is okay and doesn't have any concerns. I will try to reach daughter again on Monday.

## 2013-12-31 NOTE — Telephone Encounter (Signed)
Spoke with patient's daughter. She states things are better since Friday. She states the patient is moving to [place] at the end of the week. She cancelled her follow up appt in April. She was not interested in social services.

## 2014-01-15 ENCOUNTER — Other Ambulatory Visit: Payer: Self-pay | Admitting: Neurology

## 2014-01-15 NOTE — Telephone Encounter (Signed)
Go ahead and refill for a month and then she will need to find new physician where she is moving.

## 2014-01-15 NOTE — Telephone Encounter (Signed)
RX sent to pharmacy with note for patient to get refills from the MD she will follow up with.

## 2014-01-15 NOTE — Telephone Encounter (Signed)
Patient's follow up cancelled due to patient's daughter stating she was moving out of state. I don't see where you ordered this medication. Please advise.

## 2014-01-21 ENCOUNTER — Emergency Department (HOSPITAL_COMMUNITY)
Admission: EM | Admit: 2014-01-21 | Discharge: 2014-01-21 | Disposition: A | Discharge status: Home / Self Care | Payer: Medicare Other | Attending: Emergency Medicine | Admitting: Emergency Medicine

## 2014-01-21 ENCOUNTER — Encounter (HOSPITAL_COMMUNITY): Payer: Self-pay | Admitting: Emergency Medicine

## 2014-01-21 ENCOUNTER — Emergency Department (HOSPITAL_COMMUNITY): Payer: Medicare Other

## 2014-01-21 DIAGNOSIS — R410 Disorientation, unspecified: Secondary | ICD-10-CM

## 2014-01-21 DIAGNOSIS — I10 Essential (primary) hypertension: Secondary | ICD-10-CM | POA: Insufficient documentation

## 2014-01-21 DIAGNOSIS — Z8719 Personal history of other diseases of the digestive system: Secondary | ICD-10-CM | POA: Insufficient documentation

## 2014-01-21 DIAGNOSIS — G20 Parkinson's disease: Secondary | ICD-10-CM | POA: Insufficient documentation

## 2014-01-21 DIAGNOSIS — G20A1 Parkinson's disease without dyskinesia, without mention of fluctuations: Secondary | ICD-10-CM | POA: Insufficient documentation

## 2014-01-21 DIAGNOSIS — F05 Delirium due to known physiological condition: Secondary | ICD-10-CM | POA: Insufficient documentation

## 2014-01-21 DIAGNOSIS — Z87440 Personal history of urinary (tract) infections: Secondary | ICD-10-CM | POA: Insufficient documentation

## 2014-01-21 DIAGNOSIS — Z79899 Other long term (current) drug therapy: Secondary | ICD-10-CM | POA: Insufficient documentation

## 2014-01-21 LAB — CBC WITH DIFFERENTIAL/PLATELET
Basophils Absolute: 0 10*3/uL (ref 0.0–0.1)
Basophils Relative: 1 % (ref 0–1)
Eosinophils Absolute: 0 10*3/uL (ref 0.0–0.7)
Eosinophils Relative: 0 % (ref 0–5)
HCT: 34.2 % — ABNORMAL LOW (ref 36.0–46.0)
Hemoglobin: 11.4 g/dL — ABNORMAL LOW (ref 12.0–15.0)
Lymphocytes Relative: 21 % (ref 12–46)
Lymphs Abs: 0.8 10*3/uL (ref 0.7–4.0)
MCH: 31 pg (ref 26.0–34.0)
MCHC: 33.3 g/dL (ref 30.0–36.0)
MCV: 92.9 fL (ref 78.0–100.0)
Monocytes Absolute: 0.3 10*3/uL (ref 0.1–1.0)
Monocytes Relative: 9 % (ref 3–12)
Neutro Abs: 2.6 10*3/uL (ref 1.7–7.7)
Neutrophils Relative %: 69 % (ref 43–77)
Platelets: 207 10*3/uL (ref 150–400)
RBC: 3.68 MIL/uL — ABNORMAL LOW (ref 3.87–5.11)
RDW: 13.3 % (ref 11.5–15.5)
WBC: 3.8 10*3/uL — ABNORMAL LOW (ref 4.0–10.5)

## 2014-01-21 LAB — URINALYSIS, ROUTINE W REFLEX MICROSCOPIC
Glucose, UA: NEGATIVE mg/dL
Hgb urine dipstick: NEGATIVE
Ketones, ur: NEGATIVE mg/dL
Leukocytes, UA: NEGATIVE
Nitrite: NEGATIVE
Protein, ur: NEGATIVE mg/dL
Specific Gravity, Urine: 1.023 (ref 1.005–1.030)
Urobilinogen, UA: 0.2 mg/dL (ref 0.0–1.0)
pH: 6 (ref 5.0–8.0)

## 2014-01-21 LAB — CG4 I-STAT (LACTIC ACID): Lactic Acid, Venous: 0.97 mmol/L (ref 0.5–2.2)

## 2014-01-21 LAB — BASIC METABOLIC PANEL
BUN: 16 mg/dL (ref 6–23)
CO2: 25 mEq/L (ref 19–32)
Calcium: 9.7 mg/dL (ref 8.4–10.5)
Chloride: 103 mEq/L (ref 96–112)
Creatinine, Ser: 0.71 mg/dL (ref 0.50–1.10)
GFR calc Af Amer: >90 mL/min (ref >90)
GFR calc non Af Amer: 82 mL/min — ABNORMAL LOW (ref >90)
Glucose, Bld: 97 mg/dL (ref 70–99)
Potassium: 3.8 mEq/L (ref 3.7–5.3)
Sodium: 141 mEq/L (ref 137–147)

## 2014-01-21 MED ORDER — ACETAMINOPHEN 325 MG PO TABS
650.0000 mg | ORAL_TABLET | Freq: Once | ORAL | Status: DC
  Filled 2014-01-21: qty 2

## 2014-01-21 NOTE — Discharge Instructions (Signed)
Confusion °Confusion is the inability to think with your usual speed or clarity. Confusion may come on quickly or slowly over time. How quickly the confusion comes on depends on the cause. Confusion can be due to any number of causes. °CAUSES  °· Concussion, head injury, or head trauma. °· Seizures. °· Stroke. °· Fever. °· Senility. °· Heightened emotional states like rage or terror. °· Mental illness in which the person loses the ability to determine what is real and what is not (hallucinations). °· Infections. °· Toxic effects from alcohol, drugs, or prescription medicines. °· Dehydration and an imbalance of salts in the body (electrolytes). °· Lack of sleep. °· Low blood sugar (diabetes). °· Low levels of oxygen (for example from chronic lung disorders). °· Drug interactions or other medication side effects. °· Nutritional deficiencies, especially niacin, thiamine, vitamin C, or vitamin B. °· Sudden drop in body temperature (hypothermia). °· Illness in the elderly. Constipation can result in confusion. An elderly person who is hospitalized may become confused due to change in daily routine. °SYMPTOMS  °People often describe their thinking as cloudy or unclear when they are confused. Confusion can also include feeling disoriented. That means you are unaware of where or who you are. You may also not know what the date or time is. If confused, you may also have difficulty paying attention, remembering and making decisions. Some people also act aggressively when they are confused.  °DIAGNOSIS  °The medical evaluation of confusion may include: °· Blood and urine tests. °· X-rays. °· Brain and nervous system tests. °· Analyzing your brain waves (electroencphalogram or EEG). °· A special X-ray (MRI) of your head or other special studies. °Your physician will ask questions such as: °· Do you get days and nights mixed up? °· Are you awake during regular sleep times? °· Do you have trouble recognizing people? °· Do you  know where you are? °· Do you know the date and time? °· Does the confusion come and go? °· Is the confusion quickly getting worse? °· Has there been a recent illness? °· Has there been a recent head injury? °· Are you diabetic? °· Do you have a lung disorder? °· What medication are you taking? °· Have you taken drugs or alcohol? °TREATMENT  °An admission to the hospital may not be needed, but a confused person should not be left alone. Stay with a family member or friend until the confusion clears. Avoid alcohol, pain relievers or sedative drugs until you have fully recovered. Do not drive until your caregiver says it is okay. °HOME CARE INSTRUCTIONS °What family and friends can do: °· To find out if someone is confused ask him or her their name, age, and the date. If the person is unsure or answers incorrectly, he or she is confused. °· Always introduce yourself, no matter how well the person knows you. °· Often remind the person of his or her location. °· Place a calendar and clock near the confused person. °· Talk about current events and plans for the day. °· Try to keep the environment calm, quiet and peaceful. °· Make sure the patient keeps follow up appointments with their physician. °PREVENTION  °Ways to prevent confusion: °· Avoid alcohol. °· Eat a balanced diet. °· Get enough sleep. °· Do not become isolated. Spend time with other people and make plans for your days. °· Keep careful watch on your blood sugar levels if you are diabetic. °SEEK IMMEDIATE MEDICAL CARE IF:  °· You develop   severe headaches, repeated vomiting, seizures, blackouts or slurred speech. °· There is increasing confusion, weakness, numbness, restlessness or personality changes. °· You develop a loss of balance, have marked dizziness, feel uncoordinated or fall. °· You have delusions, hallucinations or develop severe anxiety. °· Your family members think you need to be rechecked. °Document Released: 12/30/2004 Document Revised:  02/14/2012 Document Reviewed: 08/27/2008 °Exit[employer]® Patient Information ©2014 [employer]. ° °

## 2014-01-21 NOTE — ED Notes (Signed)
Bed: WA02 Expected date:  Expected time:  Means of arrival:  Comments: ems- 79 yo, altered, UTI

## 2014-01-21 NOTE — ED Notes (Signed)
Per daughter, pt's mother has been acting lethargic and has had altered mental status since this morning. Pt has hx of parkinson's disease and UTIs. Daughter called EMS to have mother checked out. Daughter denies pt has fallen.

## 2014-01-24 ENCOUNTER — Telehealth: Payer: Self-pay | Admitting: Neurology

## 2014-01-24 NOTE — Telephone Encounter (Signed)
Left message on machine for patient's daughter to call back.   

## 2014-01-24 NOTE — Telephone Encounter (Signed)
Pt's daughter called and states that the pt refuses to get up and eat....she has fallen twice and pt's daughter is unable to pick her up. Please call her ASAP.

## 2014-01-24 NOTE — Telephone Encounter (Signed)
Called and spoke with daughter. She complains of same symptoms as our last conversation. Patient is not taking medication, she will not let them help her, etc. She did not move to [place]. They have not found a nursing home for her yet. I encouraged them to find a nursing facility for the patient so she can have 24 hour care. She wanted to know other options. I explained the only other option is home care but if they do not have long term care insurance they would have more trouble paying for that then a nursing facility. She will talk to the POA and call back as needed.

## 2014-01-26 NOTE — ED Provider Notes (Signed)
CSN: [id number]     Arrival date & time 01/21/14  1911 History   First MD Initiated Contact with Patient 01/21/14 1928     Chief Complaint  Patient presents with  . Altered Mental Status     (Consider location/radiation/quality/duration/timing/severity/associated sxs/prior Treatment) HPI  79 year old female brought in by daughter for evaluation of decreased energy levels and some mild confusion. Patient has a past history of Parkinson's. Her symptoms seem more severe than they typically are though over the past 2 days. Has felt warm to the touch. Daughter reports a past history of frequent urinary tract infections. Patient with no complaints, but not the greatest historian. She insists that there is nothing wrong with her. No recent falls or medication changes. Appetite has been okay.  Past Medical History  Diagnosis Date  . Hypertension   . Tremor   . History of gallstones   . H/O urinary tract infection   . Urine incontinence   . Parkinson disease    Past Surgical History  Procedure Laterality Date  . Shoulder surgery    . Knee surgery    . Colonoscopy     Family History  Problem Relation Age of Onset  . Diabetes Father   . Colon cancer Neg Hx   . Birth defects Neg Hx   . Heart disease Neg Hx   . Stroke Neg Hx    History  Substance Use Topics  . Smoking status: Never Smoker   . Smokeless tobacco: Never Used  . Alcohol Use: No   OB History   Grav Para Term Preterm Abortions TAB SAB Ect Mult Living                 Review of Systems  All systems reviewed and negative, other than as noted in HPI.   Allergies  Review of patient's allergies indicates no known allergies.  Home Medications   Current Outpatient Rx  Name  Route  Sig  Dispense  Refill  . carbidopa-levodopa (SINEMET CR) 50-200 MG per tablet   Oral   Take 1 tablet by mouth at bedtime.   30 tablet   5   . carbidopa-levodopa (SINEMET) 25-100 MG per tablet   Oral   Take 1.5 tablets by mouth 3  (three) times daily.   140 tablet   5   . QUEtiapine (SEROQUEL) 25 MG tablet   Oral   Take 25 mg by mouth at bedtime.          BP 132/60  Pulse 77  Temp(Src) 99.3 F (37.4 C) (Oral)  Resp 18  SpO2 100% Physical Exam  Nursing note and vitals reviewed. Constitutional: She appears well-developed and well-nourished. No distress.  HENT:  Head: Normocephalic and atraumatic.  Eyes: Conjunctivae are normal. Right eye exhibits no discharge. Left eye exhibits no discharge.  Neck: Neck supple.  Cardiovascular: Normal rate, regular rhythm and normal heart sounds.  Exam reveals no gallop and no friction rub.   No murmur heard. Pulmonary/Chest: Effort normal and breath sounds normal. No respiratory distress.  Abdominal: Soft. She exhibits no distension. There is no tenderness.  Genitourinary:  No CVA tenderness  Musculoskeletal: She exhibits no edema and no tenderness.  Neurological: She is alert.  Shuffling gait. Resting tremor. Cranial nerves appear to be intact. No focal motor weakness.  Skin: Skin is warm and dry.  Psychiatric: Her behavior is normal. Thought content normal.    ED Course  Procedures (including critical care time) Labs Review Labs Reviewed  CBC  WITH DIFFERENTIAL - Abnormal; Notable for the following:    WBC 3.8 (*)    RBC 3.68 (*)    Hemoglobin 11.4 (*)    HCT 34.2 (*)    All other components within normal limits  BASIC METABOLIC PANEL - Abnormal; Notable for the following:    GFR calc non Af Amer 82 (*)    All other components within normal limits  URINALYSIS, ROUTINE W REFLEX MICROSCOPIC - Abnormal; Notable for the following:    Color, Urine AMBER (*)    APPearance CLOUDY (*)    Bilirubin Urine SMALL (*)    All other components within normal limits  CG4 I-STAT (LACTIC ACID)   Imaging Review No results found.  EKG Interpretation   None       MDM   Final diagnoses:  Acute confusion    79 year old female with some confusion. Report of  fever, but afebrile emergency room. Parkinsonian symptoms may be more pronounced the setting of a febrile illness. I do not have an obvious source for this if so. Hemodynamically stable. Workup was pretty unremarkable. Patient would like to go home. She has no complaints, but is not very forthcoming. There does appear to be significant metabolic derangement of evidence of SBI. I feel she is safe for discharge at this time. Return precautions were discussed. Outpatient followup.    Raeford RazorStephen Traves Majchrzak, MD 01/26/14 [phone]

## 2014-01-27 ENCOUNTER — Encounter (HOSPITAL_COMMUNITY): Payer: Self-pay | Admitting: Emergency Medicine

## 2014-01-27 ENCOUNTER — Inpatient Hospital Stay (HOSPITAL_COMMUNITY)
Admission: EM | Admit: 2014-01-27 | Discharge: 2014-01-31 | DRG: 690 | Disposition: A | Discharge status: Skilled Nursing Facility | Payer: Medicare Other | Attending: Internal Medicine | Admitting: Internal Medicine

## 2014-01-27 ENCOUNTER — Emergency Department (HOSPITAL_COMMUNITY): Payer: Medicare Other

## 2014-01-27 DIAGNOSIS — N390 Urinary tract infection, site not specified: Principal | ICD-10-CM | POA: Diagnosis present

## 2014-01-27 DIAGNOSIS — R531 Weakness: Secondary | ICD-10-CM

## 2014-01-27 DIAGNOSIS — E46 Unspecified protein-calorie malnutrition: Secondary | ICD-10-CM | POA: Diagnosis present

## 2014-01-27 DIAGNOSIS — F0390 Unspecified dementia without behavioral disturbance: Secondary | ICD-10-CM | POA: Diagnosis present

## 2014-01-27 DIAGNOSIS — IMO0001 Reserved for inherently not codable concepts without codable children: Secondary | ICD-10-CM

## 2014-01-27 DIAGNOSIS — R5381 Other malaise: Secondary | ICD-10-CM | POA: Diagnosis present

## 2014-01-27 DIAGNOSIS — A498 Other bacterial infections of unspecified site: Secondary | ICD-10-CM | POA: Diagnosis present

## 2014-01-27 DIAGNOSIS — E440 Moderate protein-calorie malnutrition: Secondary | ICD-10-CM | POA: Diagnosis present

## 2014-01-27 DIAGNOSIS — R509 Fever, unspecified: Secondary | ICD-10-CM

## 2014-01-27 DIAGNOSIS — E860 Dehydration: Secondary | ICD-10-CM | POA: Diagnosis present

## 2014-01-27 DIAGNOSIS — Z66 Do not resuscitate: Secondary | ICD-10-CM | POA: Diagnosis present

## 2014-01-27 DIAGNOSIS — Z79899 Other long term (current) drug therapy: Secondary | ICD-10-CM

## 2014-01-27 DIAGNOSIS — I10 Essential (primary) hypertension: Secondary | ICD-10-CM | POA: Diagnosis present

## 2014-01-27 DIAGNOSIS — R4182 Altered mental status, unspecified: Secondary | ICD-10-CM

## 2014-01-27 DIAGNOSIS — G20 Parkinson's disease: Secondary | ICD-10-CM

## 2014-01-27 DIAGNOSIS — R030 Elevated blood-pressure reading, without diagnosis of hypertension: Secondary | ICD-10-CM

## 2014-01-27 DIAGNOSIS — Z681 Body mass index (BMI) 19 or less, adult: Secondary | ICD-10-CM

## 2014-01-27 DIAGNOSIS — G20A1 Parkinson's disease without dyskinesia, without mention of fluctuations: Secondary | ICD-10-CM | POA: Diagnosis present

## 2014-01-27 DIAGNOSIS — R627 Adult failure to thrive: Secondary | ICD-10-CM | POA: Diagnosis present

## 2014-01-27 LAB — URINALYSIS, ROUTINE W REFLEX MICROSCOPIC
GLUCOSE, UA: NEGATIVE mg/dL
HGB URINE DIPSTICK: NEGATIVE
KETONES UR: 15 mg/dL — AB
Nitrite: POSITIVE — AB
PROTEIN: 30 mg/dL — AB
Specific Gravity, Urine: 1.023 (ref 1.005–1.030)
Urobilinogen, UA: 1 mg/dL (ref 0.0–1.0)
pH: 6 (ref 5.0–8.0)

## 2014-01-27 LAB — COMPREHENSIVE METABOLIC PANEL
ALBUMIN: 3.3 g/dL — AB (ref 3.5–5.2)
ALT: <5 U/L (ref 0–35)
AST: 18 U/L (ref 0–37)
Alkaline Phosphatase: 51 U/L (ref 39–117)
BILIRUBIN TOTAL: 0.6 mg/dL (ref 0.3–1.2)
BUN: 23 mg/dL (ref 6–23)
CO2: 25 mEq/L (ref 19–32)
CREATININE: 0.58 mg/dL (ref 0.50–1.10)
Calcium: 9.5 mg/dL (ref 8.4–10.5)
Chloride: 102 mEq/L (ref 96–112)
GFR calc Af Amer: >90 mL/min (ref >90)
GFR calc non Af Amer: 87 mL/min — ABNORMAL LOW (ref >90)
Glucose, Bld: 98 mg/dL (ref 70–99)
Potassium: 3.8 mEq/L (ref 3.7–5.3)
Sodium: 143 mEq/L (ref 137–147)
TOTAL PROTEIN: 7.4 g/dL (ref 6.0–8.3)

## 2014-01-27 LAB — INFLUENZA PANEL BY PCR (TYPE A & B)
H1N1FLUPCR: NOT DETECTED
Influenza A By PCR: NEGATIVE
Influenza B By PCR: NEGATIVE

## 2014-01-27 LAB — CBC
HCT: 36.7 % (ref 36.0–46.0)
Hemoglobin: 12 g/dL (ref 12.0–15.0)
MCH: 30.8 pg (ref 26.0–34.0)
MCHC: 32.7 g/dL (ref 30.0–36.0)
MCV: 94.1 fL (ref 78.0–100.0)
Platelets: 200 10*3/uL (ref 150–400)
RBC: 3.9 MIL/uL (ref 3.87–5.11)
RDW: 13.4 % (ref 11.5–15.5)
WBC: 6.2 10*3/uL (ref 4.0–10.5)

## 2014-01-27 LAB — URINE MICROSCOPIC-ADD ON

## 2014-01-27 MED ORDER — SODIUM CHLORIDE 0.9 % IV SOLN
INTRAVENOUS | Status: AC
  Administered 2014-01-27: 21:00:00 via INTRAVENOUS
  Administered 2014-01-28: 1000 mL via INTRAVENOUS

## 2014-01-27 MED ORDER — CARBIDOPA-LEVODOPA ER 50-200 MG PO TBCR
1.0000 | EXTENDED_RELEASE_TABLET | Freq: Every day | ORAL | Status: DC
  Administered 2014-01-27 – 2014-01-29 (×3): 1 via ORAL
  Filled 2014-01-27 (×6): qty 1

## 2014-01-27 MED ORDER — DEXTROSE 5 % IV SOLN
1.0000 g | INTRAVENOUS | Status: DC
  Administered 2014-01-27 – 2014-01-30 (×4): 1 g via INTRAVENOUS
  Filled 2014-01-27 (×6): qty 10

## 2014-01-27 MED ORDER — ONDANSETRON HCL 4 MG/2ML IJ SOLN: 4.0000 mg | Freq: Four times a day (QID) | INTRAMUSCULAR | Status: DC | PRN

## 2014-01-27 MED ORDER — POLYETHYLENE GLYCOL 3350 17 G PO PACK
17.0000 g | PACK | Freq: Every day | ORAL | Status: DC | PRN
  Filled 2014-01-27: qty 1

## 2014-01-27 MED ORDER — CARBIDOPA-LEVODOPA 25-100 MG PO TABS
1.5000 | ORAL_TABLET | Freq: Three times a day (TID) | ORAL | Status: DC
  Administered 2014-01-28 – 2014-01-31 (×11): 1.5 via ORAL
  Filled 2014-01-27 (×14): qty 1.5

## 2014-01-27 MED ORDER — ONDANSETRON HCL 4 MG PO TABS: 4.0000 mg | ORAL_TABLET | Freq: Four times a day (QID) | ORAL | Status: DC | PRN

## 2014-01-27 MED ORDER — SODIUM CHLORIDE 0.9 % IV BOLUS (SEPSIS)
500.0000 mL | Freq: Once | INTRAVENOUS | Status: AC
  Administered 2014-01-27: 500 mL via INTRAVENOUS

## 2014-01-27 MED ORDER — HEPARIN SODIUM (PORCINE) 5000 UNIT/ML IJ SOLN
5000.0000 [IU] | Freq: Three times a day (TID) | INTRAMUSCULAR | Status: DC
  Administered 2014-01-27 – 2014-01-31 (×11): 5000 [IU] via SUBCUTANEOUS
  Filled 2014-01-27 (×14): qty 1

## 2014-01-27 MED ORDER — GUAIFENESIN-DM 100-10 MG/5ML PO SYRP
5.0000 mL | ORAL_SOLUTION | ORAL | Status: DC | PRN
  Filled 2014-01-27: qty 5

## 2014-01-27 MED ORDER — QUETIAPINE FUMARATE 25 MG PO TABS
25.0000 mg | ORAL_TABLET | Freq: Every day | ORAL | Status: DC
  Administered 2014-01-27 – 2014-01-29 (×3): 25 mg via ORAL
  Filled 2014-01-27 (×5): qty 1

## 2014-01-27 MED ORDER — BOOST PLUS PO LIQD
237.0000 mL | Freq: Three times a day (TID) | ORAL | Status: DC
  Administered 2014-01-28 – 2014-01-31 (×11): 237 mL via ORAL
  Filled 2014-01-27 (×15): qty 237

## 2014-01-27 MED ORDER — HYDROCODONE-ACETAMINOPHEN 5-325 MG PO TABS: 1.0000 | ORAL_TABLET | ORAL | Status: DC | PRN

## 2014-01-27 NOTE — ED Notes (Signed)
Attempted to call report. Nurse unable to take report a this time. Will call back to receive report.

## 2014-01-27 NOTE — Progress Notes (Signed)
Pt admitted to the unit. Pt is stable and oriented per baseline. Family oriented to room, staff, and call bell. Educated to call for any assistance. Bed in lowest position, call bell within reach- will continue to monitor.

## 2014-01-27 NOTE — H&P (Signed)
Patient Demographics  Meagan [name], is a 79 y.o. female  MRN: [id number]   DOB - 1935/03/21  Admit Date - 01/27/2014  Outpatient Primary MD for the patient is Nicki ReaperBAITY, REGINA, NP   With History of -  Past Medical History  Diagnosis Date  . Hypertension   . Tremor   . History of gallstones   . H/O urinary tract infection   . Urine incontinence   . Parkinson disease       Past Surgical History  Procedure Laterality Date  . Shoulder surgery    . Knee surgery    . Colonoscopy      in for   Chief Complaint  Patient presents with  . Abdominal Pain     HPI  Meagan [name]  is a 79 y.o. female, with history of Parkinson's disease, gallstones, hypertension, tremors secondary to Parkinson's disease who lives with her daughter has been brought into the ER by her family with 7-8 day history of low-grade fevers, poor oral intake and failure to thrive. She was recently brought to the ER a few days ago where workup was unremarkable and she was sent home, however she continues to do poorly at home with low-grade fevers and was brought to the ER at again today. Patient denies any headache, no chest pain or palpitations, no productive cough, no abdominal pain, no diarrhea, no focal weakness.   In the ER patient appears to be frail, poorly kept and dehydrated, workup consistent with UTI along with generalized weakness and I was called to admit the patient.    Review of Systems    In addition to the HPI above,   No Fever-chills, No Headache, No changes with Vision or hearing, No problems swallowing food or Liquids, No Chest pain, Cough or Shortness of Breath, No Abdominal pain, No Nausea or Vommitting, Bowel movements are regular, No Blood in stool or Urine, No dysuria, No new skin rashes or bruises, No  new joints pains-aches,  No new weakness, tingling, numbness in any extremity, but generalized weakness, poor appetite, No recent weight gain or loss, No polyuria, polydypsia or polyphagia, No significant Mental Stressors.  A full 10 point Review of Systems was done, except as stated above, all other Review of Systems were negative.   Social History History  Substance Use Topics  . Smoking status: Never Smoker   . Smokeless tobacco: Never Used  . Alcohol Use: No      Family History Family History  Problem Relation Age of Onset  . Diabetes Father   . Colon cancer Neg Hx   . Birth defects Neg Hx   . Heart disease Neg Hx   . Stroke Neg Hx       Prior to Admission medications   Medication Sig Start Date End Date Taking? Authorizing Provider  carbidopa-levodopa (SINEMET CR) 50-200 MG per tablet Take 1 tablet by mouth at bedtime. 08/24/13  Yes Octaviano Battyebecca S Tat, DO  carbidopa-levodopa (SINEMET) 25-100 MG per tablet Take 1.5 tablets by mouth 3 (three) times daily. 08/24/13 08/24/14 Yes Rebecca S Tat, DO  QUEtiapine (SEROQUEL) 25 MG tablet Take 25 mg by mouth at bedtime.   Yes Historical Provider, MD    No Known Allergies  Physical Exam  Vitals  Blood pressure 135/73, pulse 81, temperature 100.2 F (37.9 C), temperature source Rectal, resp. rate 16, SpO2 96.00%.   1. General frail elderly poorly kept African American female lying in bed in NAD,     2. Normal affect and insight, Not Suicidal or Homicidal, Awake Alert, Oriented X 3.  3. No F.N deficits, ALL C.Nerves Intact, Strength 5/5 all 4 extremities, Sensation intact all 4 extremities, Plantars down going. Some resting pill rolling tremors in upper extremities right more than left.  4. Ears and Eyes appear Normal, Conjunctivae clear, PERRLA. Moist Oral Mucosa.  5. Supple Neck, No JVD, No cervical lymphadenopathy appriciated, No Carotid Bruits.  6. Symmetrical Chest wall movement, Good air movement bilaterally, CTAB.  7.  RRR, No Gallops, Rubs or Murmurs, No Parasternal Heave.  8. Positive Bowel Sounds, Abdomen Soft, Non tender, No organomegaly appriciated,No rebound -guarding or rigidity.  9.  No Cyanosis, Normal Skin Turgor, No Skin Rash or Bruise.  10. Good muscle tone,  joints appear normal , no effusions, Normal ROM.  11. No Palpable Lymph Nodes in Neck or Axillae     Data Review  CBC  Recent Labs Lab 01/21/14 2040 01/27/14 1543  WBC 3.8* 6.2  HGB 11.4* 12.0  HCT 34.2* 36.7  PLT 207 200  MCV 92.9 94.1  MCH 31.0 30.8  MCHC 33.3 32.7  RDW 13.3 13.4  LYMPHSABS 0.8  --   MONOABS 0.3  --   EOSABS 0.0  --   BASOSABS 0.0  --    ------------------------------------------------------------------------------------------------------------------  Chemistries   Recent Labs Lab 01/21/14 2040 01/27/14 1543  NA 141 143  K 3.8 3.8  CL 103 102  CO2 25 25  GLUCOSE 97 98  BUN 16 23  CREATININE 0.71 0.58  CALCIUM 9.7 9.5  AST  --  18  ALT  --  <5  ALKPHOS  --  51  BILITOT  --  0.6   ------------------------------------------------------------------------------------------------------------------ CrCl is unknown because both a height and weight (above a minimum accepted value) are required for this calculation. ------------------------------------------------------------------------------------------------------------------ No results found for this basename: TSH, T4TOTAL, FREET3, T3FREE, THYROIDAB,  in the last 72 hours   Coagulation profile No results found for this basename: INR, PROTIME,  in the last 168 hours ------------------------------------------------------------------------------------------------------------------- No results found for this basename: DDIMER,  in the last 72 hours -------------------------------------------------------------------------------------------------------------------  Cardiac Enzymes No results found for this basename: CK, CKMB, TROPONINI,  MYOGLOBIN,  in the last 168 hours ------------------------------------------------------------------------------------------------------------------ No components found with this basename: POCBNP,    ---------------------------------------------------------------------------------------------------------------  Urinalysis    Component Value Date/Time   COLORURINE AMBER* 01/27/2014 1543   APPEARANCEUR CLOUDY* 01/27/2014 1543   LABSPEC 1.023 01/27/2014 1543   PHURINE 6.0 01/27/2014 1543   GLUCOSEU NEGATIVE 01/27/2014 1543   HGBUR NEGATIVE 01/27/2014 1543   BILIRUBINUR SMALL* 01/27/2014 1543   BILIRUBINUR neg 08/03/2013 1157   KETONESUR 15* 01/27/2014 1543   PROTEINUR 30* 01/27/2014 1543   UROBILINOGEN 1.0 01/27/2014 1543   UROBILINOGEN 0.2 08/03/2013 1157   NITRITE POSITIVE* 01/27/2014 1543   NITRITE neg 08/03/2013 1157   LEUKOCYTESUR LARGE* 01/27/2014 1543    ----------------------------------------------------------------------------------------------------------------  Imaging results:   Dg Chest 2 View  01/27/2014  CLINICAL DATA:  Pain.  Hypertension.  Dementia.  EXAM: CHEST  2 VIEW  COMPARISON:  01/21/2014  FINDINGS: Heart size and pulmonary vascularity are normal. There is tortuosity of the thoracic aorta. Slight accentuation of the upper thoracic kyphosis, stable. No effusions.  IMPRESSION: No acute abnormality.   Electronically Signed   By: Geanie Cooley M.D.   On: 01/27/2014 17:05           Assessment & Plan   1. UTI with generalized weakness, dehydration, poor oral intake and failure to thrive - will be kept for 23 hour observation on a MedSurg bed, obtain urine culture, rule out influenza, IV Rocephin and IV fluids, PT evaluation.    2. Parkinson's disease with baseline tremors. No acute issues continue home medications unchanged.    3. Moderate protein calorie malnutrition on examination. Placed on protein supplementation.     4.HTN - stable, currently not on any  medications. Will monitor.      DVT Prophylaxis Heparin    AM Labs Ordered, also please review Full Orders  Family Communication: Admission, patients condition and plan of care including tests being ordered have been discussed with the patient and daughter who indicate understanding and agree with the plan and Code Status.  Code Status DNR  Likely DC to  Home  Condition Fair  Time spent in minutes :35    SINGH,PRASHANT K M.D on 01/27/2014 at 5:19 PM  Between 7am to 7pm - Pager - [phone]  After 7pm go to [email or web address] - password [email or web address]  And look for the night coverage person covering me after hours  [facility] Office  [phone]

## 2014-01-27 NOTE — ED Provider Notes (Signed)
CSN: [id number]     Arrival date & time 01/27/14  1458 History   First MD Initiated Contact with Patient 01/27/14 1517     Chief Complaint  Patient presents with  . Abdominal Pain     (Consider location/radiation/quality/duration/timing/severity/associated sxs/prior Treatment) Patient is a 79 y.o. female presenting with abdominal pain. The history is provided by the patient and a relative. The history is limited by the condition of the patient.  Abdominal Pain Associated symptoms: fever   Associated symptoms: no diarrhea and no vomiting   pt with hx Parkinsons, dementia, presents w intermittent fevers in past week. Daughter states today was complaining of right lower abd pain. Pt w dementia, v poor historian, does not answer questions, moans, - level 5 caveat.   Hx uti. No recent abx use. Occasional non productive cough. Daughter states appears generally weak, w decreased po intake this week. Was seen in ED earlier in week, no specific dx.     Past Medical History  Diagnosis Date  . Hypertension   . Tremor   . History of gallstones   . H/O urinary tract infection   . Urine incontinence   . Parkinson disease    Past Surgical History  Procedure Laterality Date  . Shoulder surgery    . Knee surgery    . Colonoscopy     Family History  Problem Relation Age of Onset  . Diabetes Father   . Colon cancer Neg Hx   . Birth defects Neg Hx   . Heart disease Neg Hx   . Stroke Neg Hx    History  Substance Use Topics  . Smoking status: Never Smoker   . Smokeless tobacco: Never Used  . Alcohol Use: No   OB History   Grav Para Term Preterm Abortions TAB SAB Ect Mult Living                 Review of Systems  Unable to perform ROS: Dementia  Constitutional: Positive for fever.  Gastrointestinal: Positive for abdominal pain. Negative for vomiting and diarrhea.  level 5 caveat, dementia.       Allergies  Review of patient's allergies indicates no known allergies.  Home  Medications   Current Outpatient Rx  Name  Route  Sig  Dispense  Refill  . carbidopa-levodopa (SINEMET CR) 50-200 MG per tablet   Oral   Take 1 tablet by mouth at bedtime.   30 tablet   5   . carbidopa-levodopa (SINEMET) 25-100 MG per tablet   Oral   Take 1.5 tablets by mouth 3 (three) times daily.   140 tablet   5   . QUEtiapine (SEROQUEL) 25 MG tablet   Oral   Take 25 mg by mouth at bedtime.          BP 126/78  Pulse 89  Temp(Src) 100.2 F (37.9 C) (Rectal)  Resp 16  SpO2 16% Physical Exam  Nursing note and vitals reviewed. Constitutional: She appears well-developed and well-nourished. No distress.  HENT:  Head: Atraumatic.  Mouth/Throat: Oropharynx is clear and moist.  Eyes: Conjunctivae are normal. Pupils are equal, round, and reactive to light. No scleral icterus.  Neck: Normal range of motion. Neck supple. No tracheal deviation present.  No stiffness or rigidity  Cardiovascular: Normal rate, regular rhythm, normal heart sounds and intact distal pulses.   Pulmonary/Chest: Effort normal and breath sounds normal. No respiratory distress.  Abdominal: Soft. Normal appearance and bowel sounds are normal. She exhibits no distension and  no mass. There is no tenderness. There is no rebound and no guarding.  Genitourinary:  No cva tenderness  Musculoskeletal: She exhibits no edema and no tenderness.  Lymphadenopathy:    She has no cervical adenopathy.  Neurological: She is alert.  Alert, moans re iv placement/lab draw, doesn't verbally respond to questioning. Moves bil extremities purposefully w good strength. Resting tremor.    Skin: Skin is warm and dry. No rash noted. She is not diaphoretic.  Psychiatric:  Mildly agitated.     ED Course  Procedures (including critical care time)   Results for orders placed during the hospital encounter of 01/27/14  URINALYSIS, ROUTINE W REFLEX MICROSCOPIC      Result Value Ref Range   Color, Urine AMBER (*) YELLOW    APPearance CLOUDY (*) CLEAR   Specific Gravity, Urine 1.023  1.005 - 1.030   pH 6.0  5.0 - 8.0   Glucose, UA NEGATIVE  NEGATIVE mg/dL   Hgb urine dipstick NEGATIVE  NEGATIVE   Bilirubin Urine SMALL (*) NEGATIVE   Ketones, ur 15 (*) NEGATIVE mg/dL   Protein, ur 30 (*) NEGATIVE mg/dL   Urobilinogen, UA 1.0  0.0 - 1.0 mg/dL   Nitrite POSITIVE (*) NEGATIVE   Leukocytes, UA LARGE (*) NEGATIVE  CBC      Result Value Ref Range   WBC 6.2  4.0 - 10.5 K/uL   RBC 3.90  3.87 - 5.11 MIL/uL   Hemoglobin 12.0  12.0 - 15.0 g/dL   HCT 57.8  46.9 - 62.9 %   MCV 94.1  78.0 - 100.0 fL   MCH 30.8  26.0 - 34.0 pg   MCHC 32.7  30.0 - 36.0 g/dL   RDW 52.8  41.3 - 24.4 %   Platelets 200  150 - 400 K/uL  COMPREHENSIVE METABOLIC PANEL      Result Value Ref Range   Sodium 143  137 - 147 mEq/L   Potassium 3.8  3.7 - 5.3 mEq/L   Chloride 102  96 - 112 mEq/L   CO2 25  19 - 32 mEq/L   Glucose, Bld 98  70 - 99 mg/dL   BUN 23  6 - 23 mg/dL   Creatinine, Ser 0.10  0.50 - 1.10 mg/dL   Calcium 9.5  8.4 - 27.2 mg/dL   Total Protein 7.4  6.0 - 8.3 g/dL   Albumin 3.3 (*) 3.5 - 5.2 g/dL   AST 18  0 - 37 U/L   ALT <5  0 - 35 U/L   Alkaline Phosphatase 51  39 - 117 U/L   Total Bilirubin 0.6  0.3 - 1.2 mg/dL   GFR calc non Af Amer 87 (*) >90 mL/min   GFR calc Af Amer >90  >90 mL/min  URINE MICROSCOPIC-ADD ON      Result Value Ref Range   WBC, UA TOO NUMEROUS TO COUNT  <3 WBC/hpf   Bacteria, UA MANY (*) RARE   Dg Chest 2 View  01/21/2014   CLINICAL DATA:  Fever, confusion, and weakness  EXAM: CHEST  2 VIEW  COMPARISON:  DG CHEST 1V PORT dated 06/04/2012  FINDINGS: The lungs are adequately inflated. There is no focal infiltrate. The cardiac silhouette is normal in size. The pulmonary vascularity is not engorged. There is mild tortuosity of the descending thoracic aorta. The observed portions of the bony thorax exhibit no acute abnormalities. There are mild degenerative disc changes of the thoracic spine.  IMPRESSION:  There is no evidence  of active cardiopulmonary disease.   Electronically Signed   By: David  [place]   On: 01/21/2014 20:27      MDM  Iv ns. Labs. Ua.   Reviewed nursing notes and prior charts for additional history.   uti on labs. Urine culture. Rocephin iv.  Given poor po intake, uti, altered mental status, weakness, will admit for iv abx, ivf.       Suzi RootsKevin E Astor Gentle, MD 01/27/14 [phone]

## 2014-01-27 NOTE — ED Notes (Signed)
Pt here by ems for abd and pelvis pain, seen at [facility] on Monday and sent home, then fell Tuesday with no complaints nad started having abd pain today and foul odor to urine.

## 2014-01-27 NOTE — ED Notes (Signed)
Patient transported to X-ray 

## 2014-01-28 ENCOUNTER — Encounter (HOSPITAL_COMMUNITY): Payer: Self-pay | Admitting: General Practice

## 2014-01-28 DIAGNOSIS — G20 Parkinson's disease: Secondary | ICD-10-CM

## 2014-01-28 DIAGNOSIS — R4182 Altered mental status, unspecified: Secondary | ICD-10-CM

## 2014-01-28 DIAGNOSIS — R509 Fever, unspecified: Secondary | ICD-10-CM

## 2014-01-28 DIAGNOSIS — R030 Elevated blood-pressure reading, without diagnosis of hypertension: Secondary | ICD-10-CM

## 2014-01-28 DIAGNOSIS — N390 Urinary tract infection, site not specified: Principal | ICD-10-CM

## 2014-01-28 LAB — BASIC METABOLIC PANEL
BUN: 16 mg/dL (ref 6–23)
CALCIUM: 8.8 mg/dL (ref 8.4–10.5)
CHLORIDE: 110 meq/L (ref 96–112)
CO2: 26 mEq/L (ref 19–32)
Creatinine, Ser: 0.59 mg/dL (ref 0.50–1.10)
GFR calc Af Amer: >90 mL/min (ref >90)
GFR calc non Af Amer: 87 mL/min — ABNORMAL LOW (ref >90)
Glucose, Bld: 84 mg/dL (ref 70–99)
Potassium: 3.7 mEq/L (ref 3.7–5.3)
SODIUM: 146 meq/L (ref 137–147)

## 2014-01-28 LAB — CBC
HCT: 32.3 % — ABNORMAL LOW (ref 36.0–46.0)
Hemoglobin: 10.5 g/dL — ABNORMAL LOW (ref 12.0–15.0)
MCH: 30.7 pg (ref 26.0–34.0)
MCHC: 32.5 g/dL (ref 30.0–36.0)
MCV: 94.4 fL (ref 78.0–100.0)
PLATELETS: 178 10*3/uL (ref 150–400)
RBC: 3.42 MIL/uL — AB (ref 3.87–5.11)
RDW: 13.4 % (ref 11.5–15.5)
WBC: 4.7 10*3/uL (ref 4.0–10.5)

## 2014-01-28 MED ORDER — CARVEDILOL 3.125 MG PO TABS
3.1250 mg | ORAL_TABLET | Freq: Two times a day (BID) | ORAL | Status: DC
  Administered 2014-01-28 – 2014-01-31 (×6): 3.125 mg via ORAL
  Filled 2014-01-28 (×8): qty 1

## 2014-01-28 NOTE — Progress Notes (Signed)
Patient's b/p seems to be a little elevated. Systolic's are in the 160's. RN made on call MD aware. Awaiting any further orders.

## 2014-01-28 NOTE — Progress Notes (Signed)
Patient Demographics  Meagan [name], is a 79 y.o. female, DOB - July 19, 1935, ZOX:[id number]  Admit date - 01/27/2014   Admitting Physician Leroy Sea, MD  Outpatient Primary MD for the patient is Nicki Reaper, NP  LOS - 1   Chief Complaint  Patient presents with  . Abdominal Pain        Assessment & Plan     1. UTI with generalized weakness, dehydration, poor oral intake and failure to thrive - follow urine culture, rule out influenza, continue IV Rocephin and IV fluids, PT evaluation. Patient is somewhat better will increase activity.    2. Parkinson's disease with baseline tremors. No acute issues continue home medications unchanged.      3. Moderate protein calorie malnutrition on examination. Placed on protein supplementation.     4.HTN - Coreg added for better control.     Code Status: DNR  Family Communication: daughter  Disposition Plan: HHPT   Procedures     Consults     Medications  Scheduled Meds: . carbidopa-levodopa  1 tablet Oral QHS  . carbidopa-levodopa  1.5 tablet Oral TID AC  . cefTRIAXone (ROCEPHIN)  IV  1 g Intravenous Q24H  . heparin  5,000 Units Subcutaneous 3 times per day  . lactose free nutrition  237 mL Oral TID WC  . QUEtiapine  25 mg Oral QHS   Continuous Infusions: . sodium chloride 1,000 mL (01/28/14 1014)   PRN Meds:.guaiFENesin-dextromethorphan, HYDROcodone-acetaminophen, ondansetron (ZOFRAN) IV, ondansetron, polyethylene glycol  DVT Prophylaxis   Heparin    Lab Results  Component Value Date   PLT 178 01/28/2014    Antibiotics     Anti-infectives   Start     Dose/Rate Route Frequency Ordered Stop   01/27/14 1700  cefTRIAXone (ROCEPHIN) 1 g in dextrose 5 % 50 mL IVPB     1 g 100 mL/hr over 30 Minutes Intravenous  Every 24 hours 01/27/14 1659            Subjective:   [name] today has, No headache, No chest pain, No abdominal pain - No Nausea, No new weakness tingling or numbness, No Cough - SOB. Feels some better  Objective:   Filed Vitals:   01/27/14 1954 01/27/14 2156 01/28/14 0423 01/28/14 0508  BP: 167/76   145/67  Pulse: 75   74  Temp: 99.2 F (37.3 C)   98.1 F (36.7 C)  TempSrc: Oral   Oral  Resp: 16   16  Height:  5\' 10"  (1.778 m)    Weight:  57.153 kg (126 lb) 57.153 kg (126 lb)   SpO2: 96%   100%    Wt Readings from Last 3 Encounters:  01/28/14 57.153 kg (126 lb)  11/23/13 61.508 kg (135 lb 9.6 oz)  08/24/13 63.64 kg (140 lb 4.8 oz)    No intake or output data in the 24 hours ending 01/28/14 1131   Physical Exam  Awake Alert, Oriented X 3, No new F.N deficits, Normal affect, baseline tremors mostly in the right upper extremity Agenda.AT,PERRAL Supple Neck,No JVD, No cervical lymphadenopathy appriciated.  Symmetrical Chest wall movement, Good air movement bilaterally, CTAB RRR,No Gallops,Rubs or new Murmurs, No Parasternal Heave +ve B.Sounds, Abd Soft, Non tender, No organomegaly  appriciated, No rebound - guarding or rigidity. No Cyanosis, Clubbing or edema, No new Rash or bruise     Data Review   Micro Results No results found for this or any previous visit (from the past 240 hour(s)).  Radiology Reports Dg Chest 2 View  01/27/2014   CLINICAL DATA:  Pain.  Hypertension.  Dementia.  EXAM: CHEST  2 VIEW  COMPARISON:  01/21/2014  FINDINGS: Heart size and pulmonary vascularity are normal. There is tortuosity of the thoracic aorta. Slight accentuation of the upper thoracic kyphosis, stable. No effusions.  IMPRESSION: No acute abnormality.   Electronically Signed   By: Geanie CooleyJim  Maxwell M.D.   On: 01/27/2014 17:05   Dg Chest 2 View  01/21/2014   CLINICAL DATA:  Fever, confusion, and weakness  EXAM: CHEST  2 VIEW  COMPARISON:  DG CHEST 1V PORT dated 06/04/2012   FINDINGS: The lungs are adequately inflated. There is no focal infiltrate. The cardiac silhouette is normal in size. The pulmonary vascularity is not engorged. There is mild tortuosity of the descending thoracic aorta. The observed portions of the bony thorax exhibit no acute abnormalities. There are mild degenerative disc changes of the thoracic spine.  IMPRESSION: There is no evidence of active cardiopulmonary disease.   Electronically Signed   By: David  [place]   On: 01/21/2014 20:27    CBC  Recent Labs Lab 01/21/14 2040 01/27/14 1543 01/28/14 0636  WBC 3.8* 6.2 4.7  HGB 11.4* 12.0 10.5*  HCT 34.2* 36.7 32.3*  PLT 207 200 178  MCV 92.9 94.1 94.4  MCH 31.0 30.8 30.7  MCHC 33.3 32.7 32.5  RDW 13.3 13.4 13.4  LYMPHSABS 0.8  --   --   MONOABS 0.3  --   --   EOSABS 0.0  --   --   BASOSABS 0.0  --   --     Chemistries   Recent Labs Lab 01/21/14 2040 01/27/14 1543 01/28/14 0636  NA 141 143 146  K 3.8 3.8 3.7  CL 103 102 110  CO2 25 25 26   GLUCOSE 97 98 84  BUN 16 23 16   CREATININE 0.71 0.58 0.59  CALCIUM 9.7 9.5 8.8  AST  --  18  --   ALT  --  <5  --   ALKPHOS  --  51  --   BILITOT  --  0.6  --    ------------------------------------------------------------------------------------------------------------------ estimated creatinine clearance is 54 ml/min (by C-G formula based on Cr of 0.59). ------------------------------------------------------------------------------------------------------------------ No results found for this basename: HGBA1C,  in the last 72 hours ------------------------------------------------------------------------------------------------------------------ No results found for this basename: CHOL, HDL, LDLCALC, TRIG, CHOLHDL, LDLDIRECT,  in the last 72 hours ------------------------------------------------------------------------------------------------------------------ No results found for this basename: TSH, T4TOTAL, FREET3, T3FREE,  THYROIDAB,  in the last 72 hours ------------------------------------------------------------------------------------------------------------------ No results found for this basename: VITAMINB12, FOLATE, FERRITIN, TIBC, IRON, RETICCTPCT,  in the last 72 hours  Coagulation profile No results found for this basename: INR, PROTIME,  in the last 168 hours  No results found for this basename: DDIMER,  in the last 72 hours  Cardiac Enzymes No results found for this basename: CK, CKMB, TROPONINI, MYOGLOBIN,  in the last 168 hours ------------------------------------------------------------------------------------------------------------------ No components found with this basename: POCBNP,      Time Spent in minutes   35   Elvan Ebron K M.D on 01/28/2014 at 11:31 AM  Between 7am to 7pm - Pager - [phone]  After 7pm go to [email or web address] - password [email or web address]  And look  for the night coverage person covering for me after hours  [facility] Office  [phone]

## 2014-01-28 NOTE — Progress Notes (Signed)
UR completed. Patient changed to inpatient- requiring IV antibiotics.  

## 2014-01-28 NOTE — Progress Notes (Signed)
INITIAL NUTRITION ASSESSMENT  DOCUMENTATION CODES Per approved criteria  -Severe malnutrition in the context of chronic illness -Underweight   INTERVENTION: Recommend diet liberalization to Regular. Continue Boost PO TID between meals. RD provided "High Calorie High Protein" education handout to family. RD to continue to follow nutrition care plan.  NUTRITION DIAGNOSIS: Inadequate oral intake related to poor appetite as evidenced by pt report and weight loss.   Goal: Intake to meet >90% of estimated nutrition needs.  Monitor:  weight trends, lab trends, I/O's, PO intake, supplement tolerance  Reason for Assessment: Malnutrition Screening Tool + Low Braden Score  79 y.o. female  Admitting Dx: UTI (lower urinary tract infection)  ASSESSMENT: PMHx significant for HTN, gallstones, UTI, Parkinson's disease. Admitted with low grade fevers, poor PO intake, and FTT. Work-up reveals UTI.  Currently ordered for Boost PO TID between meals.  Pt with 10% wt loss x 5 months. Family at bedside express concern over patient's declining weight. Pt eats very little at meal times. Per family, pt used to be larger and was very happy with her weight loss. Discussed importance of eating well for weight maintenance and/or weight gain, as pt is now underweight. Pt with severe muscle mass loss in temples and clavicles. RD brought pt a Boost to drink and provided family with "High Calorie, High Protein Nutrition Therapy."  Pt meets criteria for severe MALNUTRITION in the context of chronic illness as evidenced by 10% wt loss x 5 months, intake of <75% x at least 1 month and severe muscle mass loss.   Height: Ht Readings from Last 1 Encounters:  01/27/14 5\' 10"  (1.778 m)    Weight: Wt Readings from Last 1 Encounters:  01/28/14 126 lb (57.153 kg)    Ideal Body Weight: 150 lb  % Ideal Body Weight: 84%  Wt Readings from Last 10 Encounters:  01/28/14 126 lb (57.153 kg)  11/23/13 135 lb 9.6 oz  (61.508 kg)  08/24/13 140 lb 4.8 oz (63.64 kg)  08/10/13 142 lb (64.411 kg)  08/03/13 141 lb (63.957 kg)  06/15/13 144 lb (65.318 kg)  07/14/12 160 lb (72.576 kg)  06/14/12 167 lb 12.3 oz (76.1 kg)  06/07/12 187 lb 6.3 oz (85 kg)  02/08/12 177 lb (80.287 kg)    Usual Body Weight: 140 lb  % Usual Body Weight: 90%  BMI:  Body mass index is 18.08 kg/(m^2). Underweight  Estimated Nutritional Needs: Kcal: 1500 - 1700 Protein: 60 - 70 g Fluid: 1.5 - 1.7 liters daily  Skin: intact  Diet Order: Cardiac  EDUCATION NEEDS: -No education needs identified at this time  No intake or output data in the 24 hours ending 01/28/14 0859  Last BM: 2/20  Labs:   Recent Labs Lab 01/21/14 2040 01/27/14 1543 01/28/14 0636  NA 141 143 146  K 3.8 3.8 3.7  CL 103 102 110  CO2 25 25 26   BUN 16 23 16   CREATININE 0.71 0.58 0.59  CALCIUM 9.7 9.5 8.8  GLUCOSE 97 98 84    CBG (last 3)  No results found for this basename: GLUCAP,  in the last 72 hours  Scheduled Meds: . carbidopa-levodopa  1 tablet Oral QHS  . carbidopa-levodopa  1.5 tablet Oral TID AC  . cefTRIAXone (ROCEPHIN)  IV  1 g Intravenous Q24H  . heparin  5,000 Units Subcutaneous 3 times per day  . lactose free nutrition  237 mL Oral TID WC  . QUEtiapine  25 mg Oral QHS    Continuous  Infusions: . sodium chloride 75 mL/hr at 01/27/14 2038    Past Medical History  Diagnosis Date  . Hypertension   . Tremor   . History of gallstones   . H/O urinary tract infection   . Urine incontinence   . Parkinson disease     Past Surgical History  Procedure Laterality Date  . Shoulder surgery    . Knee surgery    . Colonoscopy      Jarold Motto MS, RD, LDN Inpatient Registered Dietitian Pager: [phone] After-hours pager: [phone]

## 2014-01-28 NOTE — Evaluation (Signed)
Physical Therapy Evaluation Patient Details Name: Meagan [name] MRN: [id number] DOB: Jul 08, 1935 Today's Date: 01/28/2014 Time: [id number] PT Time Calculation (min): 29 min  PT Assessment / Plan / Recommendation History of Present Illness  Pt adm with UTI and weakness.  Clinical Impression  Pt admitted with above. Pt currently with functional limitations due to the deficits listed below (see PT Problem List).  Pt will benefit from skilled PT to increase their independence and safety with mobility. Feel pt needs ST-SNF prior to return home due pt now requiring max A for all mobility.     PT Assessment  Patient needs continued PT services    Follow Up Recommendations  SNF    Does the patient have the potential to tolerate intense rehabilitation      Barriers to Discharge        Equipment Recommendations  None recommended by PT    Recommendations for Other Services     Frequency Min 3X/week    Precautions / Restrictions Precautions Precautions: Fall   Pertinent Vitals/Pain No signs of pain.      Mobility  Bed Mobility Overal bed mobility: Needs Assistance Bed Mobility: Supine to Sit;Sit to Supine Supine to sit: Max assist Sit to supine: Total assist General bed mobility comments: Assist to manage legs and trunk. Transfers Overall transfer level: Needs assistance Equipment used: None (Stedy) Transfers: Sit to/from Stand Sit to Stand: Max assist;From elevated surface General transfer comment: Assist to bring hips and trunk up. Pt stood x 2 with stedy.  Unable to achieve standing with bil forearm support.    Exercises     PT Diagnosis: Difficulty walking;Generalized weakness  PT Problem List: Decreased strength;Decreased activity tolerance;Decreased balance;Decreased mobility;Decreased knowledge of use of DME PT Treatment Interventions: DME instruction;Gait training;Functional mobility training;Therapeutic activities;Therapeutic exercise;Balance  training;Patient/family education     PT Goals(Current goals can be found in the care plan section) Acute Rehab PT Goals Patient Stated Goal: Pt's daughter would like pt to get stronger prior to returning home. PT Goal Formulation: With patient/family Time For Goal Achievement: 01/28/14 Potential to Achieve Goals: Good  Visit Information  Last PT Received On: 01/28/14 Assistance Needed: +2 History of Present Illness: Pt adm with UTI and weakness.       Prior Functioning  Home Living Family/patient expects to be discharged to:: Private residence Living Arrangements: Children Available Help at Discharge: Family;Available 24 hours/day Type of Home: House Home Access: Level entry Home Layout: One level Home Equipment: Walker - 2 wheels;Wheelchair - manual Prior Function Level of Independence: Needs assistance Gait / Transfers Assistance Needed: In past few weeks pt has been requiring significant assist for transfers and not amb. Prior to this pt was amb with rolling walker and min A. Communication Communication: No difficulties Dominant Hand: Right    Cognition  Cognition Arousal/Alertness: Awake/alert Behavior During Therapy: Anxious Overall Cognitive Status: History of cognitive impairments - at baseline    Extremity/Trunk Assessment Upper Extremity Assessment Upper Extremity Assessment: Generalized weakness Lower Extremity Assessment Lower Extremity Assessment: RLE deficits/detail;LLE deficits/detail RLE Deficits / Details: grossly 2+/5 LLE Deficits / Details: grossly 2+/5   Balance Balance Overall balance assessment: Needs assistance Sitting-balance support: Bilateral upper extremity supported Sitting balance-Leahy Scale: Fair Sitting balance - Comments: Sat EOB x 15 minutes with close supervision. Standing balance support: Bilateral upper extremity supported Standing balance-Leahy Scale: Zero  End of Session PT - End of Session Equipment Utilized During  Treatment: Gait belt;Other (comment) Antony Salmon(Stedy) Activity Tolerance: Patient tolerated treatment well Patient  left: in bed;with call bell/phone within reach;with bed alarm set;with family/visitor present Nurse Communication: Mobility status  GP     [facility] 01/28/2014, 3:28 PM  [facility] PT [phone]

## 2014-01-29 LAB — URINE CULTURE: Colony Count: >=100000

## 2014-01-29 NOTE — Progress Notes (Signed)
Patient Demographics  Meagan [name], is a 79 y.o. female, DOB - 1935/02/19, YQM:[id number]  Admit date - 01/27/2014   Admitting Physician Leroy SeaPrashant K Singh, MD  Outpatient Primary MD for the patient is Nicki ReaperBAITY, REGINA, NP  LOS - 2   Chief Complaint  Patient presents with  . Abdominal Pain      Brief summary  79 year old [place] female who lives with her daughter with advanced Parkinson's disease, brought in for gradually progressive weakness which got acutely worse over the last 2-3 days, workup consistent with UTI. Family unable to take care of the patient at home. Seen by PT, await SNF placement.  Assessment & Plan     1. UTI with generalized weakness, dehydration, poor oral intake and failure to thrive - follow urine culture, rule out influenza, continue IV Rocephin and IV fluids, PT evaluation. Patient is somewhat better will increase activity. Upon discharge then be switched to oral Cipro based on culture sensitivity.    2. Parkinson's disease with baseline tremors. No acute issues continue home medications unchanged.      3. Moderate protein calorie malnutrition on examination. Placed on protein supplementation.     4.HTN - Coreg added for better control.     Code Status: DNR  Family Communication: daughter  Disposition Plan: SNF   Procedures     Consults     Medications  Scheduled Meds: . carbidopa-levodopa  1 tablet Oral QHS  . carbidopa-levodopa  1.5 tablet Oral TID AC  . carvedilol  3.125 mg Oral BID WC  . cefTRIAXone (ROCEPHIN)  IV  1 g Intravenous Q24H  . heparin  5,000 Units Subcutaneous 3 times per day  . lactose free nutrition  237 mL Oral TID WC  . QUEtiapine  25 mg Oral QHS   Continuous Infusions:   PRN  Meds:.guaiFENesin-dextromethorphan, HYDROcodone-acetaminophen, ondansetron (ZOFRAN) IV, ondansetron, polyethylene glycol  DVT Prophylaxis   Heparin    Lab Results  Component Value Date   PLT 178 01/28/2014    Antibiotics     Anti-infectives   Start     Dose/Rate Route Frequency Ordered Stop   01/27/14 1700  cefTRIAXone (ROCEPHIN) 1 g in dextrose 5 % 50 mL IVPB     1 g 100 mL/hr over 30 Minutes Intravenous Every 24 hours 01/27/14 1659            Subjective:   Meagan RenderElizabeth Vera today has, No headache, No chest pain, No abdominal pain - No Nausea, No new weakness tingling or numbness, No Cough - SOB. Feels some better  Objective:   Filed Vitals:   01/28/14 1500 01/28/14 2105 01/29/14 0300 01/29/14 0757  BP: 136/69 128/69 123/67 136/70  Pulse: 77 71 66 62  Temp: 99.3 F (37.4 C) 98.6 F (37 C) 98.3 F (36.8 C)   TempSrc: Oral Oral Oral   Resp: 16 18 18    Height:      Weight:   58 kg (127 lb 13.9 oz)   SpO2: 99% 97% 97%     Wt Readings from Last 3 Encounters:  01/29/14 58 kg (127 lb 13.9 oz)  11/23/13 61.508 kg (135 lb 9.6 oz)  08/24/13 63.64 kg (140 lb 4.8 oz)    No intake or output data  in the 24 hours ending 01/29/14 1109   Physical Exam  Awake Alert, Oriented X 3, No new F.N deficits, Normal affect, baseline tremors mostly in the right upper extremity [place].AT,PERRAL Supple Neck,No JVD, No cervical lymphadenopathy appriciated.  Symmetrical Chest wall movement, Good air movement bilaterally, CTAB RRR,No Gallops,Rubs or new Murmurs, No Parasternal Heave +ve B.Sounds, Abd Soft, Non tender, No organomegaly appriciated, No rebound - guarding or rigidity. No Cyanosis, Clubbing or edema, No new Rash or bruise     Data Review   Micro Results Recent Results (from the past 240 hour(s))  URINE CULTURE     Status: None   Collection Time    01/27/14  3:40 PM      Result Value Ref Range Status   Specimen Description URINE, CATHETERIZED   Final   Special Requests  NONE   Final   Culture  Setup Time     Final   Value: 01/27/2014 19:44     Performed at [facility] Count     Final   Value: >=100,000 COLONIES/ML     Performed at [facility]   Culture     Final   Value: ESCHERICHIA COLI     Performed at [facility]   Report Status 01/29/2014 FINAL   Final   Organism ID, Bacteria ESCHERICHIA COLI   Final    Radiology Reports Dg Chest 2 View  01/27/2014   CLINICAL DATA:  Pain.  Hypertension.  Dementia.  EXAM: CHEST  2 VIEW  COMPARISON:  01/21/2014  FINDINGS: Heart size and pulmonary vascularity are normal. There is tortuosity of the thoracic aorta. Slight accentuation of the upper thoracic kyphosis, stable. No effusions.  IMPRESSION: No acute abnormality.   Electronically Signed   By: Geanie Cooley M.D.   On: 01/27/2014 17:05   Dg Chest 2 View  01/21/2014   CLINICAL DATA:  Fever, confusion, and weakness  EXAM: CHEST  2 VIEW  COMPARISON:  DG CHEST 1V PORT dated 06/04/2012  FINDINGS: The lungs are adequately inflated. There is no focal infiltrate. The cardiac silhouette is normal in size. The pulmonary vascularity is not engorged. There is mild tortuosity of the descending thoracic aorta. The observed portions of the bony thorax exhibit no acute abnormalities. There are mild degenerative disc changes of the thoracic spine.  IMPRESSION: There is no evidence of active cardiopulmonary disease.   Electronically Signed   By: David  [place]   On: 01/21/2014 20:27    CBC  Recent Labs Lab 01/27/14 1543 01/28/14 0636  WBC 6.2 4.7  HGB 12.0 10.5*  HCT 36.7 32.3*  PLT 200 178  MCV 94.1 94.4  MCH 30.8 30.7  MCHC 32.7 32.5  RDW 13.4 13.4    Chemistries   Recent Labs Lab 01/27/14 1543 01/28/14 0636  NA 143 146  K 3.8 3.7  CL 102 110  CO2 25 26  GLUCOSE 98 84  BUN 23 16  CREATININE 0.58 0.59  CALCIUM 9.5 8.8  AST 18  --   ALT <5  --   ALKPHOS 51  --   BILITOT 0.6  --     ------------------------------------------------------------------------------------------------------------------ estimated creatinine clearance is 54.8 ml/min (by C-G formula based on Cr of 0.59). ------------------------------------------------------------------------------------------------------------------ No results found for this basename: HGBA1C,  in the last 72 hours ------------------------------------------------------------------------------------------------------------------ No results found for this basename: CHOL, HDL, LDLCALC, TRIG, CHOLHDL, LDLDIRECT,  in the last 72 hours ------------------------------------------------------------------------------------------------------------------ No results found for this basename: TSH, T4TOTAL, FREET3,  T3FREE, THYROIDAB,  in the last 72 hours ------------------------------------------------------------------------------------------------------------------ No results found for this basename: VITAMINB12, FOLATE, FERRITIN, TIBC, IRON, RETICCTPCT,  in the last 72 hours  Coagulation profile No results found for this basename: INR, PROTIME,  in the last 168 hours  No results found for this basename: DDIMER,  in the last 72 hours  Cardiac Enzymes No results found for this basename: CK, CKMB, TROPONINI, MYOGLOBIN,  in the last 168 hours ------------------------------------------------------------------------------------------------------------------ No components found with this basename: POCBNP,      Time Spent in minutes   35   SINGH,PRASHANT K M.D on 01/29/2014 at 11:09 AM  Between 7am to 7pm - Pager - [phone]  After 7pm go to [email or web address] - password [email or web address]  And look for the night coverage person covering for me after hours  [facility] Office  [phone]

## 2014-01-29 NOTE — Clinical Documentation Improvement (Signed)
Possible Clinical Conditions?  Severe Malnutrition   Protein Calorie Malnutrition Severe Protein Calorie Malnutrition Other Condition Cannot clinically determine  Supporting Information:(as per Nutrition assessment) "Pt meets criteria for severe MALNUTRITION in the context of chronic illness as evidenced by 10% wt loss x 5 months, intake of <75% x at least 1 month and severe muscle mass loss."  Risk Factors:"Signs & Symptoms:Pt meets criteria for severe MALNUTRITION in the context of chronic illness as evidenced by 10% wt loss x 5 months, intake of <75% x at least 1 month and severe muscle mass loss."  INTERVENTION: Recommend diet liberalization to Regular. Continue Boost PO TID between meals. RD provided "High Calorie High Protein" education handout to family. RD to continue to follow nutrition care plan.  Thank You, Meagan PrimroseJoan B Tya Haughey,RN, BSN, CCDS Clinical Documentation Specialist:  [phone]   [phone] - Health Information Management

## 2014-01-29 NOTE — Clinical Social Work Psychosocial (Signed)
Clinical Social Work Department BRIEF PSYCHOSOCIAL ASSESSMENT 01/29/2014  Patient:  Meagan [name] A     Account Number:  [id number]     Admit date:  01/27/2014  Clinical Social Worker:  Lovey Newcomer  Date/Time:  01/29/2014 03:37 PM  Referred by:  Physician  Date Referred:  01/29/2014 Referred for  SNF Placement   Other Referral:   Interview type:  Family Other interview type:   Patient not oriented at time of assessment. CSW interviewed patient's daughter at bedside.    PSYCHOSOCIAL DATA Living Status:  FAMILY Admitted from facility:   Level of care:   Primary support name:  Meagan [name] Primary support relationship to patient:  CHILD, ADULT Degree of support available:   Support is adequate.    CURRENT CONCERNS Current Concerns  Post-Acute Placement   Other Concerns:    SOCIAL WORK ASSESSMENT / PLAN CSW met with patient's daughter at bedside to complete assessment. Daughter Meagan [name] states that patient has been living with her for the past three years. CSW explained that PT has recommended SNF for patient and explained the SNF search and placement process. Daughter is agreeable to SNF placement and is relieved to know that she will have a break from caring for her mother. Meagan [name] states that she has not been able to adequately manage patient for the past three weeks even with the in home assistance she pays for privately. Meagan [name] was clearly tired and states "I'm burnt out. If rehab can help her get back to how she was, I would be able to care for her at home." CSW provided emotional support to [place] and encouraged her to be mindful of self care. Meagan [name] does not have a preference for SNF at this time and waits for bed offers.   Assessment/plan status:  Psychosocial Support/Ongoing Assessment of Needs Other assessment/ plan:   Complete FL2, Fax, PASRR   Information/referral to community resources:   CSW contact information and SNF list given to patient's  daughter Meagan [name].    PATIENT'S/FAMILY'S RESPONSE TO PLAN OF CARE: Patient's daughter Meagan [name] is agreeable to SNF placement for patient. She intends on patient going to SNF for "short term rehab" and then returning home. Meagan [name] was pleasant, appropriate, and appreciative of CSW contact. CSW will follow up with bed offers when available.       Liz Beach, [place], [place], [phone]

## 2014-01-29 NOTE — Progress Notes (Signed)
IV was leaking. RN and Director took IV out and attempted twice to place a new one. No success. Paging IV team

## 2014-01-30 DIAGNOSIS — R5383 Other fatigue: Secondary | ICD-10-CM

## 2014-01-30 DIAGNOSIS — I10 Essential (primary) hypertension: Secondary | ICD-10-CM

## 2014-01-30 DIAGNOSIS — R5381 Other malaise: Secondary | ICD-10-CM

## 2014-01-30 NOTE — Progress Notes (Signed)
PATIENT DETAILS Name: Meagan [name] Age: 79 y.o. Sex: female Date of Birth: [birth year] Admit Date: 01/27/2014 Admitting Physician Leroy Sea, MD ZOX:WRUEA, REGINA, NP  Subjective: No major issues overnight, awake and alert. Daughter feeding patient breakfast.  Assessment/Plan: UTI - Patient was admitted, started on Rocephin and IV fluids. She is significantly improved. She remains afebrile and without any leukocytosis. Urine cultures showing pansensitive Escherichia coli, will Rocephin while she is inpatient and transitioned to ciprofloxacin on discharge.  Dehydration Secondary to above, resolved with IV fluids.  Generalized weakness/failure to thrive - Likely worsened by acute illness. Has Parkinson's disease, underlying frailty has significant contributors. - Physical therapy evaluation, nutrition evaluation done while she was inpatient. Current recommendations are to continue with supplementation, and transferred to skilled facility for rehabilitation.   Parkinson's disease with baseline tremors - No acute issues continue home medications unchanged.    Moderate protein calorie malnutrition on examination -Placed on protein supplementation .  HTN -Controlled with Coreg.   Disposition: Remain inpatient- SNF on 2/26  DVT Prophylaxis: Prophylactic  Heparin   Code Status:  DNR  Family Communication Daughter at bedside  Procedures:  None  CONSULTS:  None  MEDICATIONS: Scheduled Meds: . carbidopa-levodopa  1 tablet Oral QHS  . carbidopa-levodopa  1.5 tablet Oral TID AC  . carvedilol  3.125 mg Oral BID WC  . cefTRIAXone (ROCEPHIN)  IV  1 g Intravenous Q24H  . heparin  5,000 Units Subcutaneous 3 times per day  . lactose free nutrition  237 mL Oral TID WC  . QUEtiapine  25 mg Oral QHS   Continuous Infusions:  PRN Meds:.guaiFENesin-dextromethorphan, HYDROcodone-acetaminophen, ondansetron (ZOFRAN) IV, ondansetron, polyethylene  glycol  Antibiotics: Anti-infectives   Start     Dose/Rate Route Frequency Ordered Stop   01/27/14 1700  cefTRIAXone (ROCEPHIN) 1 g in dextrose 5 % 50 mL IVPB     1 g 100 mL/hr over 30 Minutes Intravenous Every 24 hours 01/27/14 1659         PHYSICAL EXAM: Vital signs in last 24 hours: Filed Vitals:   01/29/14 2117 01/30/14 0451 01/30/14 0539 01/30/14 0834  BP: 146/67 165/74 132/65 149/69  Pulse: 67 66  65  Temp: 98.9 F (37.2 C) 98.5 F (36.9 C)    TempSrc: Oral Oral    Resp: 18 18    Height:      Weight:  59.3 kg (130 lb 11.7 oz)    SpO2: 98% 99%      Weight change: 1.3 kg (2 lb 13.9 oz) Filed Weights   01/28/14 0423 01/29/14 0300 01/30/14 0451  Weight: 57.153 kg (126 lb) 58 kg (127 lb 13.9 oz) 59.3 kg (130 lb 11.7 oz)   Body mass index is 18.76 kg/(m^2).   Gen Exam: Awake and alert with clear speech.  Appears with generalized weakness. Neck: Supple, No JVD.   Chest: B/L Clear.   CVS: S1 S2 Regular, no murmurs.  Abdomen: soft, BS +, non tender, non distended.  Extremities: no edema, lower extremities warm to touch. Neurologic: Non Focal.  Tremor is evident. Skin: No Rash.   Wounds: N/A.    Intake/Output from previous day:  Intake/Output Summary (Last 24 hours) at 01/30/14 1041 Last data filed at 01/30/14 0900  Gross per 24 hour  Intake    240 ml  Output      0 ml  Net    240 ml     LAB RESULTS: CBC  Recent  Labs Lab 01/27/14 1543 01/28/14 0636  WBC 6.2 4.7  HGB 12.0 10.5*  HCT 36.7 32.3*  PLT 200 178  MCV 94.1 94.4  MCH 30.8 30.7  MCHC 32.7 32.5  RDW 13.4 13.4    Chemistries   Recent Labs Lab 01/27/14 1543 01/28/14 0636  NA 143 146  K 3.8 3.7  CL 102 110  CO2 25 26  GLUCOSE 98 84  BUN 23 16  CREATININE 0.58 0.59  CALCIUM 9.5 8.8    CBG: No results found for this basename: GLUCAP,  in the last 168 hours  GFR Estimated Creatinine Clearance: 56 ml/min (by C-G formula based on Cr of 0.59).  Coagulation profile No results  found for this basename: INR, PROTIME,  in the last 168 hours  Cardiac Enzymes No results found for this basename: CK, CKMB, TROPONINI, MYOGLOBIN,  in the last 168 hours  No components found with this basename: POCBNP,  No results found for this basename: DDIMER,  in the last 72 hours No results found for this basename: HGBA1C,  in the last 72 hours No results found for this basename: CHOL, HDL, LDLCALC, TRIG, CHOLHDL, LDLDIRECT,  in the last 72 hours No results found for this basename: TSH, T4TOTAL, FREET3, T3FREE, THYROIDAB,  in the last 72 hours No results found for this basename: VITAMINB12, FOLATE, FERRITIN, TIBC, IRON, RETICCTPCT,  in the last 72 hours No results found for this basename: LIPASE, AMYLASE,  in the last 72 hours  Urine Studies No results found for this basename: UACOL, UAPR, USPG, UPH, UTP, UGL, UKET, UBIL, UHGB, UNIT, UROB, ULEU, UEPI, UWBC, URBC, UBAC, CAST, CRYS, UCOM, BILUA,  in the last 72 hours  MICROBIOLOGY: Recent Results (from the past 240 hour(s))  URINE CULTURE     Status: None   Collection Time    01/27/14  3:40 PM      Result Value Ref Range Status   Specimen Description URINE, CATHETERIZED   Final   Special Requests NONE   Final   Culture  Setup Time     Final   Value: 01/27/2014 19:44     [facility]formed at [facility]   Colony Count     Final   Value: >=100,000 COLONIES/ML     Performed at [facility]   Culture     Final   Value: ESCHERICHIA COLI     [facility]formed at [facility]   Report Status 01/29/2014 FINAL   Final   Organism ID, Bacteria ESCHERICHIA COLI   Final    RADIOLOGY STUDIES/RESULTS: Dg Chest 2 View  01/27/2014   CLINICAL DATA:  Pain.  Hypertension.  Dementia.  EXAM: CHEST  2 VIEW  COMPARISON:  01/21/2014  FINDINGS: Heart size and pulmonary vascularity are normal. There is tortuosity of the thoracic aorta. Slight accentuation of the upper thoracic kyphosis, stable. No effusions.  IMPRESSION: No acute abnormality.    Electronically Signed   By: Geanie CooleyJim  Maxwell M.D.   On: 01/27/2014 17:05   Dg Chest 2 View  01/21/2014   CLINICAL DATA:  Fever, confusion, and weakness  EXAM: CHEST  2 VIEW  COMPARISON:  DG CHEST 1V PORT dated 06/04/2012  FINDINGS: The lungs are adequately inflated. There is no focal infiltrate. The cardiac silhouette is normal in size. The pulmonary vascularity is not engorged. There is mild tortuosity of the descending thoracic aorta. The observed portions of the bony thorax exhibit no acute abnormalities. There are mild degenerative disc changes of the thoracic spine.  IMPRESSION: There is no evidence of active cardiopulmonary disease.   Electronically Signed   By: David  [place]   On: 01/21/2014 20:27    Jeoffrey Massed, MD  [facility] Pager:[phone]  If 7PM-7AM, please contact night-coverage [email or web address] Password [facility] 01/30/2014, 10:41 AM   LOS: 3 days

## 2014-01-30 NOTE — Clinical Social Work Placement (Signed)
Clinical Social Work Department CLINICAL SOCIAL WORK PLACEMENT NOTE 01/30/2014  [facility]:  [name] A  Account Number:  [id number] Admit date:  01/27/2014  Clinical Social Worker:  Lavell LusterJOSEPH BRYANT Veldon Wager, LCSWA  Date/time:  01/30/2014 10:00 AM  Clinical Social Work is seeking post-discharge placement for this patient at the following level of care:   SKILLED NURSING   (*CSW will update this form in Epic as items are completed)   01/29/2014  Patient/family provided with [facility] Department of Clinical Social Work's list of facilities offering this level of care within the geographic area requested by the patient (or if unable, by the patient's family).  01/29/2014  Patient/family informed of their freedom to choose among providers that offer the needed level of care, that participate in Medicare, Medicaid or managed care program needed by the patient, have an available bed and are willing to accept the patient.  01/29/2014  Patient/family informed of [facility]' ownership interest in [facility], as well as of the fact that they are under no obligation to receive care at this facility.  PASARR submitted to [employer] on 01/30/2014 PASARR number received from [employer] on 01/30/2014  FL2 transmitted to all facilities in geographic area requested by pt/family on  01/30/2014 FL2 transmitted to all facilities within larger geographic area on   Patient informed that his/her managed care company has contracts with or will negotiate with  certain facilities, including the following:     Patient/family informed of bed offers received:  01/30/2014 Patient chooses bed at  Physician recommends and patient chooses bed at    Patient to be transferred to  on   Patient to be transferred to facility by   The following physician request were entered in Epic:   Additional Comments:   [name], Bryon LionsLCSWA, LCASA, [phone]

## 2014-01-30 NOTE — Progress Notes (Signed)
Physical Therapy Treatment Patient Details Name: Meagan [name] MRN: [id number] DOB: 1934-12-20 Today's Date: 01/30/2014 Time: [id number] PT Time Calculation (min): 18 min  PT Assessment / Plan / Recommendation  History of Present Illness Pt adm with UTI and weakness.   PT Comments   Pt making steady progress.  Follow Up Recommendations  SNF     Does the patient have the potential to tolerate intense rehabilitation     Barriers to Discharge        Equipment Recommendations  None recommended by PT    Recommendations for Other Services    Frequency Min 2X/week   Progress towards PT Goals Progress towards PT goals: Progressing toward goals  Plan Current plan remains appropriate;Frequency needs to be updated    Precautions / Restrictions Precautions Precautions: Fall   Pertinent Vitals/Pain No c/o's    Mobility  Bed Mobility Supine to sit: Mod assist;HOB elevated General bed mobility comments: Assist to manage legs and trunk. Pt able to initiate but needs assist to complete. Transfers Overall transfer level: Needs assistance Equipment used: Rolling walker (2 wheeled) (Stedy) Transfers: Sit to/from Stand Sit to Stand: +2 physical assistance;Mod assist General transfer comment: Assist to bring hips and trunk up. Assist to extend hips, knees, and trunk. Ambulation/Gait Ambulation/Gait assistance: +2 physical assistance;Mod assist Ambulation Distance (Feet): 12 Feet Assistive device: Rolling walker (2 wheeled) Gait Pattern/deviations: Step-to pattern;Decreased step length - right;Decreased step length - left;Shuffle;Trunk flexed;Narrow base of support Gait velocity interpretation: Below normal speed for age/gender General Gait Details: Verbal cues to incr step length and stand more erect. Pt with difficulty initiating stepping.    Exercises     PT Diagnosis:    PT Problem List:   PT Treatment Interventions:     PT Goals (current goals can now be found in the care  plan section)    Visit Information  Last PT Received On: 01/30/14 Assistance Needed: +2 History of Present Illness: Pt adm with UTI and weakness.    Subjective Data      Cognition  Cognition Arousal/Alertness: Awake/alert Behavior During Therapy: Anxious Overall Cognitive Status: History of cognitive impairments - at baseline    Balance  Balance Overall balance assessment: Needs assistance Sitting-balance support: Bilateral upper extremity supported Sitting balance-Leahy Scale: Fair Standing balance support: Bilateral upper extremity supported Standing balance-Leahy Scale: Zero Standing balance comment: Pt with posterior lean in standing.  End of Session PT - End of Session Equipment Utilized During Treatment: Gait belt;Other (comment) Antony Salmon(Stedy) Activity Tolerance: Patient tolerated treatment well Patient left: in chair;with call bell/phone within reach;with chair alarm set Nurse Communication: Mobility status;Need for lift equipment   GP     Jordynn Perrier 01/30/2014, 9:13 AM  [facility] PT [phone]

## 2014-01-31 DIAGNOSIS — I10 Essential (primary) hypertension: Secondary | ICD-10-CM | POA: Diagnosis present

## 2014-01-31 DIAGNOSIS — E46 Unspecified protein-calorie malnutrition: Secondary | ICD-10-CM

## 2014-01-31 MED ORDER — CIPROFLOXACIN HCL 500 MG PO TABS: 500.0000 mg | ORAL_TABLET | Freq: Two times a day (BID) | ORAL | Status: DC

## 2014-01-31 MED ORDER — CARVEDILOL 3.125 MG PO TABS: 3.1250 mg | ORAL_TABLET | Freq: Two times a day (BID) | ORAL | Status: AC

## 2014-01-31 MED ORDER — BOOST PLUS PO LIQD: 237.0000 mL | Freq: Three times a day (TID) | ORAL | Status: AC

## 2014-01-31 NOTE — Discharge Summary (Signed)
PATIENT DETAILS Name: Meagan [name] Age: 79 y.o. Sex: female Date of Birth: October 04, 1935 MRN: [id number]. Admit Date: 01/27/2014 Admitting Physician: Leroy SeaPrashant K Singh, MD WUJ:WJXBJPCP:BAITY, REGINA, NP  Recommendations for Outpatient Follow-up:  1. Please check CBC and chemistries periodically.  PRIMARY DISCHARGE DIAGNOSIS:  Principal Problem:   UTI (lower urinary tract infection) Active Problems:   Physical deconditioning  Hypertension   Parkinson disease   Dehydration       PAST MEDICAL HISTORY: Past Medical History  Diagnosis Date  . Hypertension   . Tremor   . History of gallstones   . H/O urinary tract infection   . Urine incontinence   . Parkinson disease     DISCHARGE MEDICATIONS:   Medication List         carbidopa-levodopa 25-100 MG per tablet  Commonly known as:  SINEMET  Take 1.5 tablets by mouth 3 (three) times daily.     carbidopa-levodopa 50-200 MG per tablet  Commonly known as:  SINEMET CR  Take 1 tablet by mouth at bedtime.     carvedilol 3.125 MG tablet  Commonly known as:  COREG  Take 1 tablet (3.125 mg total) by mouth 2 (two) times daily with a meal.     ciprofloxacin 500 MG tablet  Commonly known as:  CIPRO  Take 1 tablet (500 mg total) by mouth 2 (two) times daily. 2 more days from 2/26 and then stop     lactose free nutrition Liqd  Take 237 mLs by mouth 3 (three) times daily with meals.     QUEtiapine 25 MG tablet  Commonly known as:  SEROQUEL  Take 25 mg by mouth at bedtime.        ALLERGIES:  No Known Allergies  BRIEF HPI:  See H&P, Labs, Consult and Test reports for all details in brief, patient is a [id number] female with a history of Parkinson's disease, hypertension who was brought to the ED for low-grade fever, poor intake and abdominal pain. She was then admitted for further eval and treatment.  CONSULTATIONS:   None  PERTINENT RADIOLOGIC STUDIES: Dg Chest 2 View  01/27/2014   CLINICAL DATA:  Pain.  Hypertension.   Dementia.  EXAM: CHEST  2 VIEW  COMPARISON:  01/21/2014  FINDINGS: Heart size and pulmonary vascularity are normal. There is tortuosity of the thoracic aorta. Slight accentuation of the upper thoracic kyphosis, stable. No effusions.  IMPRESSION: No acute abnormality.   Electronically Signed   By: Geanie CooleyJim  Maxwell M.D.   On: 01/27/2014 17:05   Dg Chest 2 View  01/21/2014   CLINICAL DATA:  Fever, confusion, and weakness  EXAM: CHEST  2 VIEW  COMPARISON:  DG CHEST 1V PORT dated 06/04/2012  FINDINGS: The lungs are adequately inflated. There is no focal infiltrate. The cardiac silhouette is normal in size. The pulmonary vascularity is not engorged. There is mild tortuosity of the descending thoracic aorta. The observed portions of the bony thorax exhibit no acute abnormalities. There are mild degenerative disc changes of the thoracic spine.  IMPRESSION: There is no evidence of active cardiopulmonary disease.   Electronically Signed   By: David  SwazilandJordan   On: 01/21/2014 20:27     PERTINENT LAB RESULTS: CBC: No results found for this basename: WBC, HGB, HCT, PLT,  in the last 72 hours CMET CMP     Component Value Date/Time   NA 146 01/28/2014 0636   K 3.7 01/28/2014 0636   CL 110 01/28/2014 0636   CO2  26 01/28/2014 0636   GLUCOSE 84 01/28/2014 0636   BUN 16 01/28/2014 0636   CREATININE 0.59 01/28/2014 0636   CALCIUM 8.8 01/28/2014 0636   PROT 7.4 01/27/2014 1543   ALBUMIN 3.3* 01/27/2014 1543   AST 18 01/27/2014 1543   ALT <5 01/27/2014 1543   ALKPHOS 51 01/27/2014 1543   BILITOT 0.6 01/27/2014 1543   GFRNONAA 87* 01/28/2014 0636   GFRAA >90 01/28/2014 0636    GFR Estimated Creatinine Clearance: 56.3 ml/min (by C-G formula based on Cr of 0.59). No results found for this basename: LIPASE, AMYLASE,  in the last 72 hours No results found for this basename: CKTOTAL, CKMB, CKMBINDEX, TROPONINI,  in the last 72 hours No components found with this basename: POCBNP,  No results found for this basename: DDIMER,  in  the last 72 hours No results found for this basename: HGBA1C,  in the last 72 hours No results found for this basename: CHOL, HDL, LDLCALC, TRIG, CHOLHDL, LDLDIRECT,  in the last 72 hours No results found for this basename: TSH, T4TOTAL, FREET3, T3FREE, THYROIDAB,  in the last 72 hours No results found for this basename: VITAMINB12, FOLATE, FERRITIN, TIBC, IRON, RETICCTPCT,  in the last 72 hours Coags: No results found for this basename: PT, INR,  in the last 72 hours Microbiology: Recent Results (from the past 240 hour(s))  URINE CULTURE     Status: None   Collection Time    01/27/14  3:40 PM      Result Value Ref Range Status   Specimen Description URINE, CATHETERIZED   Final   Special Requests NONE   Final   Culture  Setup Time     Final   Value: 01/27/2014 19:44     Performed at [facility] Count     Final   Value: >=100,000 COLONIES/ML     Performed at [facility]   Culture     Final   Value: ESCHERICHIA COLI     Performed at [facility]   Report Status 01/29/2014 FINAL   Final   Organism ID, Bacteria ESCHERICHIA COLI   Final     BRIEF HOSPITAL COURSE:  UTI  - Patient was admitted, started on Rocephin and IV fluids. She is significantly improved. She remains afebrile and without any leukocytosis. Urine cultures showing pansensitive Escherichia coli, will Rocephin while she is inpatient and transitioned to ciprofloxacin on discharge for 2 more days from 2/26.  Dehydration  Secondary to above, resolved with IV fluids.   Generalized weakness/failure to thrive  - Likely worsened by acute illness. Has Parkinson's disease, underlying frailty has significant contributors.  - Physical therapy evaluation, nutrition evaluation done while she was inpatient. Current recommendations are to continue with supplementation, and transferred to skilled facility for rehabilitation.   Parkinson's disease with baseline tremors  - No acute issues continue  home medications unchanged.   Moderate protein calorie malnutrition on examination  -Placed on protein supplementation  .  HTN  -Controlled with Coreg.   TODAY-DAY OF DISCHARGE:  Subjective:   Meagan [name] today has no headache,no chest abdominal pain,no new weakness tingling or numbness.  Objective:   Blood pressure 154/74, pulse 67, temperature 98.4 F (36.9 C), temperature source Oral, resp. rate 18, height 5\' 10"  (1.778 m), weight 59.6 kg (131 lb 6.3 oz), SpO2 98.00%.  Intake/Output Summary (Last 24 hours) at 01/31/14 0959 Last data filed at 01/31/14 0853  Gross per 24 hour  Intake  560 ml  Output      0 ml  Net    560 ml   Filed Weights   01/29/14 0300 01/30/14 0451 01/31/14 0523  Weight: 58 kg (127 lb 13.9 oz) 59.3 kg (130 lb 11.7 oz) 59.6 kg (131 lb 6.3 oz)    Exam Awake Alert, Oriented *3, No new F.N deficits, Normal affect [place].AT,PERRAL Supple Neck,No JVD, No cervical lymphadenopathy appriciated.  Symmetrical Chest wall movement, Good air movement bilaterally, CTAB RRR,No Gallops,Rubs or new Murmurs, No Parasternal Heave +ve B.Sounds, Abd Soft, Non tender, No organomegaly appriciated, No rebound -guarding or rigidity. No Cyanosis, Clubbing or edema, No new Rash or bruise  DISCHARGE CONDITION: Stable  DISPOSITION: SNF  DISCHARGE INSTRUCTIONS:    Activity:  As tolerated with Full fall precautions use walker/cane & assistance as needed  Diet recommendation: Heart Healthy diet  CODE STATUS: DO NOT RESUSCITATE  Discharge Orders   Future Appointments Provider Department Dept Phone   03/22/2014 10:45 AM Vladimir Faster, DO [facility] [phone]   Future Orders Complete By Expires   Diet - low sodium heart healthy  As directed    Increase activity slowly  As directed       Follow-up Information   Follow up with Nicki Reaper, NP. Schedule an appointment as soon as possible for a visit in 2 weeks. (after discharge from SNF)     Specialty:  Internal Medicine   Contact information:   [address] [place] [address] [phone]       Total Time spent on discharge equals 45 minutes.  SignedJeoffrey Massed 01/31/2014 9:59 AM

## 2014-01-31 NOTE — Clinical Social Work Placement (Signed)
Clinical Social Work Department CLINICAL SOCIAL WORK PLACEMENT NOTE 01/31/2014  [facility]  [facility]  Account Number:  [id number] Admit date:  01/27/2014  Clinical Social Worker:  Lavell LusterJOSEPH BRYANT Yousof Alderman, LCSWA  Date/time:  01/30/2014 10:00 AM  Clinical Social Work is seeking post-discharge placement for this patient at the following level of care:   SKILLED NURSING   (*CSW will update this form in Epic as items are completed)   01/29/2014  Patient/family provided with [facility] Department of Clinical Social Work's list of facilities offering this level of care within the geographic area requested by the patient (or if unable, by the patient's family).  01/29/2014  Patient/family informed of their freedom to choose among providers that offer the needed level of care, that participate in Medicare, Medicaid or managed care program needed by the patient, have an available bed and are willing to accept the patient.  01/29/2014  Patient/family informed of [facility]' ownership interest in [facility], as well as of the fact that they are under no obligation to receive care at this facility.  PASARR submitted to EDS on 01/30/2014 PASARR number received from EDS on 01/30/2014  FL2 transmitted to all facilities in geographic area requested by pt/family on  01/30/2014 FL2 transmitted to all facilities within larger geographic area on   Patient informed that his/her managed care company has contracts with or will negotiate with  certain facilities, including the following:     Patient/family informed of bed offers received:  01/30/2014 [facility] [place] Physician recommends and patient chooses bed at    [facility] [place] on  01/31/2014 Patient to be transferred to facility by Ambulance  The following physician request were entered in Epic:   Additional Comments: Per Md patient ready  to [facility]. RN, patient, daughter, and facility notified of DC. RN given number for report. DC packet on chart. CSW requested ambulance transport. CSW signing off.   Roddie McBryant Carlena Ruybal, [place] Washington[place]CSWA, [place], [phone]

## 2014-01-31 NOTE — Progress Notes (Signed)
Patient was discharged to a skilled  nursing home by MD order; [facility]; discharged instructions and patient belongings sent with patient to the facility; IV DIC; skin intact; [facility] via EMS.

## 2014-01-31 NOTE — Clinical Social Work Note (Signed)
Per MD patient is ready for DC to [facility]. RN, patient's daughter, and facility notified of DC. RN given number for report. DC packet left on chart. Daughter Meagan [name] will complete paperwork with facility. RN will notify CSW when ambulance transport can be requested. Full placement note to come.   Roddie McBryant Jaidah Lomax, [place]ace[place]CSWA, [place], [phone]

## 2014-01-31 NOTE — Care Management Note (Signed)
    Page 1 of 1   01/31/2014     11:11:14 AM   CARE MANAGEMENT NOTE 01/31/2014  [facility]   Account Number:  [id number]  Date Initiated:  01/31/2014  Documentation initiated by:  Letha CapeAYLOR,Jannae Fagerstrom  Subjective/Objective Assessment:   dx uti  admit- lives with daughter.     Action/Plan:   pt eval - snf   Anticipated DC Date:  01/31/2014   Anticipated DC Plan:  SKILLED NURSING FACILITY  In-house referral  Clinical Social Worker      DC Planning Services  CM consult      Choice offered to / List presented to:             Status of service:  Completed, signed off Medicare Important Message given?   (If response is "NO", the following Medicare IM given date fields will be blank) Date Medicare IM given:   Date Additional Medicare IM given:    Discharge Disposition:  SKILLED NURSING FACILITY  Per UR Regulation:  Reviewed for med. necessity/level of care/duration of stay  If discussed at Long Length of Stay Meetings, dates discussed:    Comments:

## 2014-02-05 ENCOUNTER — Telehealth: Payer: Self-pay | Admitting: Neurology

## 2014-02-05 NOTE — Telephone Encounter (Signed)
Spoke with patients daughter [name] she states that her mother was in the hospital a few weeks ago for a server UTI which causes weakness, loss of appetite and weight loss. She was [facility] the [facility]. Patients daughter states that her health seem to be getting worse. She still does not have a appetite still losing weight she can not walk and lays in bed all day in a fetal position she also states she is not getting her medication in full dose or on a time release like you prescribed. She is very dissatisfied with the care she is getting and also very concerned about her mothers health. Although i explained to her that we do not have any thing to do with placement she would like for you to give her a call in reference to these issues. [phone]

## 2014-02-05 NOTE — Telephone Encounter (Signed)
LMOM for [name] to call back. To make her aware of Dr Don Perkingat's recommendation.

## 2014-02-05 NOTE — Telephone Encounter (Signed)
Pt daughter [name] would like to talk to someone about her mother please call her back at [phone]

## 2014-02-05 NOTE — Telephone Encounter (Signed)
Tell her that I am sorry that this has happened but unfortunately its common because many of the rehab places are understaffed.  Unfortunately, i don't have much control over that.  I have had good luck with [facility] nursing [facility] facility if that helps her.

## 2014-02-07 NOTE — Telephone Encounter (Signed)
[name] called back and I made her aware there is not much we can do, but encouraged her to look into [facility]. She will do that and call back if she has any problems we can help with.

## 2014-02-08 ENCOUNTER — Non-Acute Institutional Stay (SKILLED_NURSING_FACILITY): Payer: Medicare Other | Admitting: Internal Medicine

## 2014-02-08 DIAGNOSIS — G20 Parkinson's disease: Secondary | ICD-10-CM

## 2014-02-08 DIAGNOSIS — F29 Unspecified psychosis not due to a substance or known physiological condition: Secondary | ICD-10-CM

## 2014-02-08 DIAGNOSIS — I10 Essential (primary) hypertension: Secondary | ICD-10-CM

## 2014-02-08 DIAGNOSIS — R5381 Other malaise: Secondary | ICD-10-CM

## 2014-02-08 DIAGNOSIS — N390 Urinary tract infection, site not specified: Secondary | ICD-10-CM

## 2014-02-08 NOTE — Progress Notes (Signed)
Patient ID: Meagan [name], female   DOB: Jul 03, 1935, 79 y.o.   MRN: [id number]     Meagan [name] living [place]  PCP: Nicki Reaper, NP  Code Status: DNR  No Known Allergies  Chief Complaint: New admit, post hospitalization  HPI:  79 y/o female patient is here for STR after hospital admission with UTI. Given her physical deconditioning, she has been sent to SNF for rehabilitation and goal is for her to return home. She has history of parkinson disease and HTN. In the hospital she responded well to iv antibiotics and iv fluids. She is seen in her room today. She is in no acute distress. She has memory loss and obtaining history and ROS from her is difficult. Her daughter is present at bedside. She has concerns of her mother returning home after rehabilitation. She also raises concern with poor oral intake and her behavior. She feels her mother has been sleeping more lately. She is participating with therapy but participation is limited with her memory  Review of Systems:  Obtained mainly from daughter and staff Constitutional: Negative for fever, chills, weight loss, diaphoresis.  HENT: Negative for congestion, hearing loss and sore throat.   Eyes: Negative for eye pain, blurred vision, double vision and discharge.  Respiratory: Negative for cough, sputum production, shortness of breath and wheezing.   Cardiovascular: Negative for chest pain, palpitations, orthopnea and leg swelling.  Gastrointestinal: Negative for heartburn, nausea, vomiting, abdominal pain, diarrhea and constipation.  Genitourinary: Negative for dysuria, urgency, frequency Musculoskeletal: Negative for back pain, falls  Skin: Negative for itching and rash.  Neurological: Negative for dizziness and headaches. has generalized weakness Psychiatric/Behavioral: has memory loss. Has been agitated at times both during day and night   Past Medical History  Diagnosis Date  . Hypertension   . Tremor   . History of  gallstones   . H/O urinary tract infection   . Urine incontinence   . Parkinson disease    Past Surgical History  Procedure Laterality Date  . Shoulder surgery    . Knee surgery    . Colonoscopy     Social History:   reports that she has never smoked. She has never used smokeless tobacco. She reports that she does not drink alcohol or use illicit drugs.  Family History  Problem Relation Age of Onset  . Diabetes Father   . Colon cancer Neg Hx   . Birth defects Neg Hx   . Heart disease Neg Hx   . Stroke Neg Hx     Medications: Patient's Medications  New Prescriptions   No medications on file  Previous Medications   CARBIDOPA-LEVODOPA (SINEMET CR) 50-200 MG PER TABLET    Take 1 tablet by mouth at bedtime.   CARBIDOPA-LEVODOPA (SINEMET) 25-100 MG PER TABLET    Take 1.5 tablets by mouth 3 (three) times daily.   CARVEDILOL (COREG) 3.125 MG TABLET    Take 1 tablet (3.125 mg total) by mouth 2 (two) times daily with a meal.   CIPROFLOXACIN (CIPRO) 500 MG TABLET    Take 1 tablet (500 mg total) by mouth 2 (two) times daily. 2 more days from 2/26 and then stop   LACTOSE FREE NUTRITION (BOOST PLUS) LIQD    Take 237 mLs by mouth 3 (three) times daily with meals.   QUETIAPINE (SEROQUEL) 25 MG TABLET    Take 25 mg by mouth at bedtime.  Modified Medications   No medications on file  Discontinued Medications   No medications on  file     Physical Exam: Filed Vitals:   02/08/14 1327  BP: 112/60  Pulse: 77  Temp: 97.5 F (36.4 C)  Resp: 18  Height: 5\' 10"  (1.778 m)  Weight: 131 lb (59.421 kg)  SpO2: 96%    General- elderly female in no acute distress, frail, confused Head- atraumatic, normocephalic Eyes- PERRLA, EOMI, no pallor, no icterus, no discharge Neck- no lymphadenopathy, no thyromegaly, no jugular vein distension, no carotid bruit Cardiovascular- normal s1,s2, no murmurs/ rubs/ gallops Respiratory- bilateral clear to auscultation, no wheeze, no rhonchi, no crackles, no  use of accessory muscles Abdomen- bowel sounds present, soft, non tender Musculoskeletal- able to move all 4 extremities, tremors present, has a walker Neurological-unable to assess Skin- warm and dry Psychiatry- awake, oriented to person, normal affect   Labs reviewed: Basic Metabolic Panel:  Recent Labs  16/09/9601/16/15 2040 01/27/14 1543 01/28/14 0636  NA 141 143 146  K 3.8 3.8 3.7  CL 103 102 110  CO2 25 25 26   GLUCOSE 97 98 84  BUN 16 23 16   CREATININE 0.71 0.58 0.59  CALCIUM 9.7 9.5 8.8   Liver Function Tests:  Recent Labs  06/15/13 1128 01/27/14 1543  AST 13 18  ALT 12 <5  ALKPHOS 39 51  BILITOT 0.8 0.6  PROT 7.1 7.4  ALBUMIN 3.9 3.3*   No results found for this basename: LIPASE, AMYLASE,  in the last 8760 hours No results found for this basename: AMMONIA,  in the last 8760 hours CBC:  Recent Labs  06/15/13 1128 01/21/14 2040 01/27/14 1543 01/28/14 0636  WBC 3.5* 3.8* 6.2 4.7  NEUTROABS 1.5 2.6  --   --   HGB 11.4* 11.4* 12.0 10.5*  HCT 34.0* 34.2* 36.7 32.3*  MCV 97.5 92.9 94.1 94.4  PLT 209.0 207 200 178   Imagings:  01/27/2014   CLINICAL DATA:  Pain.  Hypertension.  Dementia.  EXAM: CHEST  2 VIEW  COMPARISON:  01/21/2014  FINDINGS: Heart size and pulmonary vascularity are normal. There is tortuosity of the thoracic aorta. Slight accentuation of the upper thoracic kyphosis, stable. No effusions.  IMPRESSION: No acute abnormality.   Electronically Signed   By: Geanie CooleyJim  Maxwell M.D.   On: 01/27/2014 17:05   Dg Chest 2 View  01/21/2014   CLINICAL DATA:  Fever, confusion, and weakness  EXAM: CHEST  2 VIEW  COMPARISON:  DG CHEST 1V PORT dated 06/04/2012  FINDINGS: The lungs are adequately inflated. There is no focal infiltrate. The cardiac silhouette is normal in size. The pulmonary vascularity is not engorged. There is mild tortuosity of the descending thoracic aorta. The observed portions of the bony thorax exhibit no acute abnormalities. There are mild  degenerative disc changes of the thoracic spine.  IMPRESSION: There is no evidence of active cardiopulmonary disease.      Assessment/Plan  UTI   Completed antibiotics. Encouraged hydration. Monitor clinically  Generalized weakness Here for STR. Will have her work with PT and OT. Fall precautions. Frequent redirection required. Continue protein supplementation.  Psychosis On seroquel 25 mg daily for now. Given her sedation and ongoing agitation, will change this to 12.5 mg bid for now and reassess. If agitation persists, consider risperidone  Parkinson's disease Stable. Continue sinemet current regimen. Pt seen by neurology. No dose changes made    HTN  Controlled. Continue Coreg.   Family/ staff Communication: reviewed care plan with patient and nursing supervisor  Goals of care: STR   Labs/tests ordered: cbc, bmp  Blanchie Serve, MD  [facility] [phone] (Monday-Friday 8 am - 5 pm) [phone] (afterhours)

## 2014-02-18 ENCOUNTER — Non-Acute Institutional Stay (SKILLED_NURSING_FACILITY): Payer: Medicare Other | Admitting: Internal Medicine

## 2014-02-18 ENCOUNTER — Encounter: Payer: Self-pay | Admitting: Internal Medicine

## 2014-02-18 DIAGNOSIS — E861 Hypovolemia: Secondary | ICD-10-CM

## 2014-02-18 DIAGNOSIS — R4182 Altered mental status, unspecified: Secondary | ICD-10-CM

## 2014-02-18 NOTE — Progress Notes (Signed)
Patient ID: Meagan [name], female   DOB: [birth year], 79 y.o.   MRN: [id number]    Meagan [name] living [facility]  No Known Allergies  Chief Complaint  Patient presents with  . Acute Visit    decreased response, decreased po intake   HPI 79 y/o female patient is here for rehabilitation. Staff have concerns about her decreased clinical response with poor po intake. She has dementia and obtaining history or ROS is limited. Daughter is present at bedside. Patient is sleeping more and less interactive as per daughter.   Review of Systems:  Obtained mainly from daughter and staff Constitutional: Negative for fever, chills, diaphoresis.  HENT: Negative for congestion, hearing loss and sore throat.   Eyes: Negative for eye pain, blurred vision, double vision and discharge.  Respiratory: Negative for cough, sputum production, shortness of breath and wheezing.   Cardiovascular: Negative for chest pain, palpitations, orthopnea and leg swelling.  Gastrointestinal: Negative for heartburn, nausea, vomiting, abdominal pain Genitourinary: Negative for dysuria, urgency, frequency Musculoskeletal: Negative for back pain, falls  Skin: Negative for itching and rash.  Neurological: Negative for dizziness and headaches. has generalized weakness Psychiatric/Behavioral: has memory loss. More asleep lately  Past Medical History  Diagnosis Date  . Hypertension   . Tremor   . History of gallstones   . H/O urinary tract infection   . Urine incontinence   . Parkinson disease    Current Outpatient Prescriptions on File Prior to Visit  Medication Sig Dispense Refill  . carbidopa-levodopa (SINEMET CR) 50-200 MG per tablet Take 1 tablet by mouth at bedtime.  30 tablet  5  . carbidopa-levodopa (SINEMET) 25-100 MG per tablet Take 1.5 tablets by mouth 3 (three) times daily.  140 tablet  5  . carvedilol (COREG) 3.125 MG tablet Take 1 tablet (3.125 mg total) by mouth 2 (two) times daily with a meal.      .  lactose free nutrition (BOOST PLUS) LIQD Take 237 mLs by mouth 3 (three) times daily with meals.    0  . QUEtiapine (SEROQUEL) 25 MG tablet Take 12.5 mg by mouth at bedtime.        No current facility-administered medications on file prior to visit.   Past Surgical History  Procedure Laterality Date  . Shoulder surgery    . Knee surgery    . Colonoscopy     Physical exam BP 126/62  Pulse 77  Temp(Src) 97.1 F (36.2 C)  Resp 16  SpO2 95%  General- elderly female in no acute distress, frail, confused Head- atraumatic, normocephalic Eyes- PERRLA, EOMI, no pallor, no icterus, no discharge Neck- no lymphadenopathy Mouth- dry mucus membrane Cardiovascular- normal s1,s2, no murmurs/ rubs/ gallops Respiratory- bilateral decreased air entry Abdomen- bowel sounds present, soft, non tender Musculoskeletal- able to move all 4 extremities, tremors present Neurological-unable to assess Skin- warm and dry, poor skin turgor  Assessment/plan  Hypovolemia- concern for hypovolemia with decreased po intake in setting of progressive dementia. Will place iv access and start her on ivf D5NS @ 100 cc/hr x 1 litre for now  Altered mental status- her progressive memory loss is likely the main cause. Will need to rule out infection. Check stat cbc with diff, cmp. Will get cxr to rule out pneumonia. Also send u/a with c/s to rule out UTI. Iv fluids will be started at present. Aspiration precautions. Hold off seroquel until patient is more awake

## 2014-02-20 ENCOUNTER — Emergency Department (HOSPITAL_COMMUNITY): Payer: Medicare Other

## 2014-02-20 ENCOUNTER — Telehealth: Payer: Self-pay | Admitting: Neurology

## 2014-02-20 ENCOUNTER — Encounter (HOSPITAL_COMMUNITY): Payer: Self-pay | Admitting: Emergency Medicine

## 2014-02-20 ENCOUNTER — Inpatient Hospital Stay (HOSPITAL_COMMUNITY)
Admission: EM | Admit: 2014-02-20 | Discharge: 2014-03-06 | DRG: 689 | Disposition: E | Payer: Medicare Other | Attending: Internal Medicine | Admitting: Internal Medicine

## 2014-02-20 DIAGNOSIS — I472 Ventricular tachycardia, unspecified: Secondary | ICD-10-CM | POA: Diagnosis not present

## 2014-02-20 DIAGNOSIS — R7309 Other abnormal glucose: Secondary | ICD-10-CM | POA: Diagnosis present

## 2014-02-20 DIAGNOSIS — G20A1 Parkinson's disease without dyskinesia, without mention of fluctuations: Secondary | ICD-10-CM | POA: Diagnosis present

## 2014-02-20 DIAGNOSIS — E43 Unspecified severe protein-calorie malnutrition: Secondary | ICD-10-CM | POA: Insufficient documentation

## 2014-02-20 DIAGNOSIS — N390 Urinary tract infection, site not specified: Principal | ICD-10-CM | POA: Diagnosis present

## 2014-02-20 DIAGNOSIS — E870 Hyperosmolality and hypernatremia: Secondary | ICD-10-CM | POA: Diagnosis present

## 2014-02-20 DIAGNOSIS — R4182 Altered mental status, unspecified: Secondary | ICD-10-CM | POA: Diagnosis present

## 2014-02-20 DIAGNOSIS — A419 Sepsis, unspecified organism: Secondary | ICD-10-CM | POA: Diagnosis not present

## 2014-02-20 DIAGNOSIS — R1310 Dysphagia, unspecified: Secondary | ICD-10-CM | POA: Diagnosis present

## 2014-02-20 DIAGNOSIS — R578 Other shock: Secondary | ICD-10-CM | POA: Diagnosis not present

## 2014-02-20 DIAGNOSIS — F0390 Unspecified dementia without behavioral disturbance: Secondary | ICD-10-CM | POA: Diagnosis present

## 2014-02-20 DIAGNOSIS — I469 Cardiac arrest, cause unspecified: Secondary | ICD-10-CM | POA: Diagnosis not present

## 2014-02-20 DIAGNOSIS — J9601 Acute respiratory failure with hypoxia: Secondary | ICD-10-CM

## 2014-02-20 DIAGNOSIS — G20 Parkinson's disease: Secondary | ICD-10-CM

## 2014-02-20 DIAGNOSIS — R739 Hyperglycemia, unspecified: Secondary | ICD-10-CM

## 2014-02-20 DIAGNOSIS — G931 Anoxic brain damage, not elsewhere classified: Secondary | ICD-10-CM | POA: Diagnosis not present

## 2014-02-20 DIAGNOSIS — A409 Streptococcal sepsis, unspecified: Secondary | ICD-10-CM | POA: Diagnosis not present

## 2014-02-20 DIAGNOSIS — Z515 Encounter for palliative care: Secondary | ICD-10-CM

## 2014-02-20 DIAGNOSIS — J690 Pneumonitis due to inhalation of food and vomit: Secondary | ICD-10-CM | POA: Diagnosis not present

## 2014-02-20 DIAGNOSIS — Z66 Do not resuscitate: Secondary | ICD-10-CM | POA: Diagnosis not present

## 2014-02-20 DIAGNOSIS — I4729 Other ventricular tachycardia: Secondary | ICD-10-CM | POA: Diagnosis not present

## 2014-02-20 DIAGNOSIS — Z681 Body mass index (BMI) 19 or less, adult: Secondary | ICD-10-CM

## 2014-02-20 DIAGNOSIS — I10 Essential (primary) hypertension: Secondary | ICD-10-CM

## 2014-02-20 DIAGNOSIS — R32 Unspecified urinary incontinence: Secondary | ICD-10-CM | POA: Diagnosis present

## 2014-02-20 DIAGNOSIS — R6520 Severe sepsis without septic shock: Secondary | ICD-10-CM

## 2014-02-20 DIAGNOSIS — Z79899 Other long term (current) drug therapy: Secondary | ICD-10-CM

## 2014-02-20 DIAGNOSIS — Z87440 Personal history of urinary (tract) infections: Secondary | ICD-10-CM

## 2014-02-20 DIAGNOSIS — R799 Abnormal finding of blood chemistry, unspecified: Secondary | ICD-10-CM

## 2014-02-20 DIAGNOSIS — J9600 Acute respiratory failure, unspecified whether with hypoxia or hypercapnia: Secondary | ICD-10-CM | POA: Diagnosis not present

## 2014-02-20 DIAGNOSIS — R627 Adult failure to thrive: Secondary | ICD-10-CM | POA: Diagnosis present

## 2014-02-20 DIAGNOSIS — E872 Acidosis, unspecified: Secondary | ICD-10-CM | POA: Diagnosis not present

## 2014-02-20 DIAGNOSIS — R579 Shock, unspecified: Secondary | ICD-10-CM

## 2014-02-20 LAB — BASIC METABOLIC PANEL
BUN: 47 mg/dL — AB (ref 6–23)
CHLORIDE: 107 meq/L (ref 96–112)
CO2: 25 meq/L (ref 19–32)
CREATININE: 0.61 mg/dL (ref 0.50–1.10)
Calcium: 9.9 mg/dL (ref 8.4–10.5)
GFR calc Af Amer: >90 mL/min (ref >90)
GFR calc non Af Amer: 86 mL/min — ABNORMAL LOW (ref >90)
GLUCOSE: 132 mg/dL — AB (ref 70–99)
Potassium: 4 mEq/L (ref 3.7–5.3)
Sodium: 149 mEq/L — ABNORMAL HIGH (ref 137–147)

## 2014-02-20 LAB — URINALYSIS, ROUTINE W REFLEX MICROSCOPIC
Glucose, UA: NEGATIVE mg/dL
HGB URINE DIPSTICK: NEGATIVE
KETONES UR: NEGATIVE mg/dL
Nitrite: NEGATIVE
PH: 5 (ref 5.0–8.0)
Protein, ur: 30 mg/dL — AB
Specific Gravity, Urine: 1.027 (ref 1.005–1.030)
Urobilinogen, UA: 1 mg/dL (ref 0.0–1.0)

## 2014-02-20 LAB — MRSA PCR SCREENING: MRSA by PCR: NEGATIVE

## 2014-02-20 LAB — CBC WITH DIFFERENTIAL/PLATELET
Basophils Absolute: 0 10*3/uL (ref 0.0–0.1)
Basophils Relative: 0 % (ref 0–1)
Eosinophils Absolute: 0 10*3/uL (ref 0.0–0.7)
Eosinophils Relative: 0 % (ref 0–5)
HEMATOCRIT: 37.1 % (ref 36.0–46.0)
HEMOGLOBIN: 12.2 g/dL (ref 12.0–15.0)
LYMPHS ABS: 1.2 10*3/uL (ref 0.7–4.0)
LYMPHS PCT: 13 % (ref 12–46)
MCH: 30.5 pg (ref 26.0–34.0)
MCHC: 32.9 g/dL (ref 30.0–36.0)
MCV: 92.8 fL (ref 78.0–100.0)
MONO ABS: 0.7 10*3/uL (ref 0.1–1.0)
MONOS PCT: 8 % (ref 3–12)
NEUTROS ABS: 7.1 10*3/uL (ref 1.7–7.7)
Neutrophils Relative %: 79 % — ABNORMAL HIGH (ref 43–77)
Platelets: 242 10*3/uL (ref 150–400)
RBC: 4 MIL/uL (ref 3.87–5.11)
RDW: 13.7 % (ref 11.5–15.5)
WBC: 9 10*3/uL (ref 4.0–10.5)

## 2014-02-20 LAB — I-STAT TROPONIN, ED: Troponin i, poc: 0 ng/mL (ref 0.00–0.08)

## 2014-02-20 LAB — I-STAT CG4 LACTIC ACID, ED: Lactic Acid, Venous: 1.37 mmol/L (ref 0.5–2.2)

## 2014-02-20 LAB — URINE MICROSCOPIC-ADD ON

## 2014-02-20 MED ORDER — MIRTAZAPINE 7.5 MG PO TABS
7.5000 mg | ORAL_TABLET | Freq: Every day | ORAL | Status: DC
  Administered 2014-02-20: 7.5 mg via ORAL
  Filled 2014-02-20 (×7): qty 1

## 2014-02-20 MED ORDER — CARBIDOPA-LEVODOPA ER 50-200 MG PO TBCR
1.0000 | EXTENDED_RELEASE_TABLET | Freq: Every day | ORAL | Status: DC
  Administered 2014-02-20: 1 via ORAL
  Filled 2014-02-20 (×7): qty 1

## 2014-02-20 MED ORDER — SACCHAROMYCES BOULARDII 250 MG PO CAPS
250.0000 mg | ORAL_CAPSULE | Freq: Two times a day (BID) | ORAL | Status: DC
  Administered 2014-02-20: 250 mg via ORAL
  Filled 2014-02-20 (×13): qty 1

## 2014-02-20 MED ORDER — ONDANSETRON HCL 4 MG/2ML IJ SOLN: 4.0000 mg | Freq: Four times a day (QID) | INTRAMUSCULAR | Status: DC | PRN

## 2014-02-20 MED ORDER — CARVEDILOL 3.125 MG PO TABS
3.1250 mg | ORAL_TABLET | Freq: Two times a day (BID) | ORAL | Status: DC
  Filled 2014-02-20 (×14): qty 1

## 2014-02-20 MED ORDER — ACETAMINOPHEN 325 MG PO TABS
650.0000 mg | ORAL_TABLET | Freq: Four times a day (QID) | ORAL | Status: DC | PRN
Start: 2014-02-20 — End: 2014-02-28

## 2014-02-20 MED ORDER — MORPHINE SULFATE 2 MG/ML IJ SOLN
1.0000 mg | INTRAMUSCULAR | Status: DC | PRN
Start: 2014-02-20 — End: 2014-02-26

## 2014-02-20 MED ORDER — CIPROFLOXACIN IN D5W 400 MG/200ML IV SOLN
400.0000 mg | Freq: Once | INTRAVENOUS | Status: AC
  Administered 2014-02-20: 400 mg via INTRAVENOUS
  Filled 2014-02-20: qty 200

## 2014-02-20 MED ORDER — HYDROCODONE-ACETAMINOPHEN 5-325 MG PO TABS: 1.0000 | ORAL_TABLET | ORAL | Status: DC | PRN

## 2014-02-20 MED ORDER — CARBIDOPA-LEVODOPA 25-100 MG PO TABS
1.5000 | ORAL_TABLET | Freq: Three times a day (TID) | ORAL | Status: DC
  Filled 2014-02-20 (×18): qty 1.5

## 2014-02-20 MED ORDER — QUETIAPINE 12.5 MG HALF TABLET
12.5000 mg | ORAL_TABLET | Freq: Every day | ORAL | Status: DC
  Administered 2014-02-20: 12.5 mg via ORAL
  Filled 2014-02-20 (×7): qty 1

## 2014-02-20 MED ORDER — ACETAMINOPHEN 650 MG RE SUPP
650.0000 mg | Freq: Four times a day (QID) | RECTAL | Status: DC | PRN
  Administered 2014-02-22 – 2014-02-26 (×5): 650 mg via RECTAL
  Filled 2014-02-20 (×5): qty 1

## 2014-02-20 MED ORDER — BOOST PLUS PO LIQD
237.0000 mL | Freq: Three times a day (TID) | ORAL | Status: DC
  Administered 2014-02-21 – 2014-02-26 (×2): 237 mL via ORAL
  Filled 2014-02-20 (×19): qty 237

## 2014-02-20 MED ORDER — DEXTROSE 5 % IV SOLN
1.0000 g | INTRAVENOUS | Status: DC
  Administered 2014-02-20 – 2014-02-23 (×4): 1 g via INTRAVENOUS
  Filled 2014-02-20 (×5): qty 10

## 2014-02-20 MED ORDER — SODIUM CHLORIDE 0.9 % IV BOLUS (SEPSIS)
1000.0000 mL | Freq: Once | INTRAVENOUS | Status: AC
  Administered 2014-02-20: 1000 mL via INTRAVENOUS

## 2014-02-20 MED ORDER — ONDANSETRON HCL 4 MG PO TABS: 4.0000 mg | ORAL_TABLET | Freq: Four times a day (QID) | ORAL | Status: DC | PRN

## 2014-02-20 MED ORDER — SODIUM CHLORIDE 0.9 % IV SOLN
INTRAVENOUS | Status: DC
  Administered 2014-02-20 – 2014-02-21 (×2): via INTRAVENOUS

## 2014-02-20 NOTE — ED Notes (Signed)
Patient transported to X-ray 

## 2014-02-20 NOTE — ED Provider Notes (Signed)
CSN: [id number]     Arrival date & time 03/05/2014  1344 History   First MD Initiated Contact with Patient 02/25/2014 1347     Chief Complaint  Patient presents with  . Urinary Tract Infection     (Consider location/radiation/quality/duration/timing/severity/associated sxs/prior Treatment) HPI Meagan [name] Is a 79 year old female with a past medical history of hypertension, Parkinson's, dementia.  Level 5 caveat.  Patient was admitted 01/21/2014 for altered mental status and urinary tract infection.  During her stay she was seen to have some deconditioning and weakness.  Patient was sent to and living in skilled nursing facility for further care.  Her daughter brings her in today for complaint of "urinary tract infection."  She states that she thinks she may have a recurrent infection.  She also took the patient out of the skilled nursing facility and she felt that they were giving her inadequate care.  She states that she was to be treated with Cipro by mouth however she saw her mother frequently spit out and not take the antibiotic.  Patient daughter also states that over the past week she has noticed increasing weakness, and falling onto the side of her mouth, and difficulty swallowing.  She states her last fluid intake was by IV on Monday, 02/18/2014 at the skilled nursing facility.  She states that her mother has refused to take anything by mouth.  She denies recent fever, nausea, vomiting, facial weakness, gaze preference.  Patient does not ambulate on her own.  She is unable to follow commands. CODE STATUS is reviewed and her daughter feels she is full code at this time.   Past Medical History  Diagnosis Date  . Hypertension   . Tremor   . History of gallstones   . H/O urinary tract infection   . Urine incontinence   . Parkinson disease    Past Surgical History  Procedure Laterality Date  . Shoulder surgery    . Knee surgery    . Colonoscopy     Family History  Problem  Relation Age of Onset  . Diabetes Father   . Colon cancer Neg Hx   . Birth defects Neg Hx   . Heart disease Neg Hx   . Stroke Neg Hx    History  Substance Use Topics  . Smoking status: Never Smoker   . Smokeless tobacco: Never Used  . Alcohol Use: No   OB History   Grav Para Term Preterm Abortions TAB SAB Ect Mult Living                 Review of Systems  Unable to perform ROS   Ten systems reviewed and are negative for acute change, except as noted in the HPI.    Allergies  Review of patient's allergies indicates no known allergies.  Home Medications   Current Outpatient Rx  Name  Route  Sig  Dispense  Refill  . carbidopa-levodopa (SINEMET CR) 50-200 MG per tablet   Oral   Take 1 tablet by mouth at bedtime.   30 tablet   5   . carbidopa-levodopa (SINEMET) 25-100 MG per tablet   Oral   Take 1.5 tablets by mouth 3 (three) times daily.   140 tablet   5   . carvedilol (COREG) 3.125 MG tablet   Oral   Take 1 tablet (3.125 mg total) by mouth 2 (two) times daily with a meal.         . ciprofloxacin (CIPRO) 250 MG tablet  Oral   Take 250 mg by mouth 2 (two) times daily.         Marland Kitchen lactose free nutrition (BOOST PLUS) LIQD   Oral   Take 237 mLs by mouth 3 (three) times daily with meals.      0   . mirtazapine (REMERON) 7.5 MG tablet   Oral   Take 7.5 mg by mouth at bedtime.         Marland Kitchen QUEtiapine (SEROQUEL) 25 MG tablet   Oral   Take 12.5 mg by mouth at bedtime.          Marland Kitchen saccharomyces boulardii (FLORASTOR) 250 MG capsule   Oral   Take 250 mg by mouth 2 (two) times daily.          BP 129/80  Pulse 100  Temp(Src) 100.9 F (38.3 C) (Oral)  Resp 20  SpO2 91% Physical Exam  Nursing note and vitals reviewed. Constitutional:  Pain, frail, elderly female in no acute distress  HENT:  Head: Normocephalic and atraumatic.  Eyes: Conjunctivae are normal.  Neck: Neck supple. No JVD present.  Cardiovascular: Normal rate.   Pulmonary/Chest:  Effort normal. She has no wheezes. She has no rales.  Diffuse ronchi   Abdominal: Soft. She exhibits no distension.  Musculoskeletal: Normal range of motion.  Neurological: She is alert.    ED Course  Procedures (including critical care time) Labs Review Labs Reviewed  CBC WITH DIFFERENTIAL - Abnormal; Notable for the following:    Neutrophils Relative % 79 (*)    All other components within normal limits  BASIC METABOLIC PANEL - Abnormal; Notable for the following:    Sodium 149 (*)    Glucose, Bld 132 (*)    BUN 47 (*)    GFR calc non Af Amer 86 (*)    All other components within normal limits  URINALYSIS, ROUTINE W REFLEX MICROSCOPIC - Abnormal; Notable for the following:    Color, Urine AMBER (*)    APPearance CLOUDY (*)    Bilirubin Urine SMALL (*)    Protein, ur 30 (*)    Leukocytes, UA LARGE (*)    All other components within normal limits  URINE MICROSCOPIC-ADD ON - Abnormal; Notable for the following:    Squamous Epithelial / LPF MANY (*)    Bacteria, UA MANY (*)    Casts HYALINE CASTS (*)    All other components within normal limits  I-STAT CG4 LACTIC ACID, ED  I-STAT TROPOININ, ED   Imaging Review No results found.   EKG Interpretation None      MDM   Final diagnoses:  UTI (lower urinary tract infection)  Hypernatremia  Hyperglycemia  Elevated BUN  Difficulty swallowing   Patient with hypernatremia, elevated BUN, hyperglycemia. Likely due to dehydration. Review of chart shows E. Coli in urine with sensitivity to cipro. She has either failed treatment or has a recurrent uti. She has been removed from SNF and I feel will need IV abx. Patient's daughter wishes to take her home for care at discharge. She feels she will need help with equipment, such as a bed lift. She has been having difficulty contacting the patient's pcp. She has had frequent communication (see chart) with Dr. Arbutus Leas at [facility] neurology requesting primary care interventions.   Patient  will be admitted to hospital. Dr. Manson Passey is accepting physician. The patient appears reasonably stabilized for admission considering the current resources, flow, and capabilities available in the ED at this time, and I doubt  any other EMC requiring further screening and/or treatment in the ED prior to admission.     Arthor Captain, PA-C March 18, 2014 [phone]

## 2014-02-20 NOTE — H&P (Signed)
[facility] History and Physical  Meagan [name]:[id number] DOB: 11-Jun-1935 DOA: 02/16/2014  Referring physician: ER physician PCP: Nicki Reaper, NP   Chief Complaint: fever, poor oral intake  HPI:  79 y.o. female, with history of Parkinson's disease, dementia, hypertension, recent admission in 01/2014 for altered mental status and UTI who now presented from SNF to [facility] ED 02/16/2014 due to poor oral intake, low grade fevers and questionable UTI. She does have history of recurrent UTI's. No reports of cough, vomiting. No respiratory distress. No loss of consciousness.  In ED, BP was 126/64, HR 86, Tmax 100.9 F and oxygen saturation 91% on room air. Blood work revealed sodium of 149 otherwise unremarkable. CXr did not reveal acute cardiopulmonary findings. CT head did not show acute intracranial findings. UA showed many bacteria and too numerous to count WBC. She was started on cipro in ED.  Assessment and Plan:  Principal Problem:   UTI (lower urinary tract infection) - she was already on cipro in SNF so will change abx to rocephin 1 gm IV daily - follow up urine culture results Active Problems:   Hypernatremia - secondary to dehydration - continue IV fluids - follow up BMP in am   Parkinson disease - continue sinemet per home regimen  - PT evaluation\   HTN (hypertension) - continue coreg 3.125 mg PO BID   Radiological Exams on Admission: Dg Chest 2 View  02/17/2014   CLINICAL DATA:  SOB  EXAM: CHEST  2 VIEW  COMPARISON:  DG CHEST 2 VIEW dated 01/27/2014  FINDINGS: There is elevation of the left hemidiaphragm. Cardiac silhouette is within normal limits. There is no evidence of focal infiltrates, effusions or edema. Degenerative changes appreciated involving the left shoulder.  IMPRESSION: No evidence of acute cardiopulmonary disease.   Electronically Signed   By: Salome Holmes M.D.   On: 02/22/2014 15:11   Ct Head Wo Contrast  02/11/2014   CLINICAL DATA:  Severe  headache  EXAM: CT HEAD WITHOUT CONTRAST  TECHNIQUE: Contiguous axial images were obtained from the base of the skull through the vertex without intravenous contrast.  COMPARISON:  None.  FINDINGS: Mild atrophy. Mild ventricular enlargement consistent with atrophy. Mild chronic microvascular ischemia. No acute infarct. Negative for hemorrhage or mass. Visualized sinuses are clear. Left vertebral artery calcification. Cavernous carotid calcification.  IMPRESSION: Atrophy and chronic microvascular ischemia.  No acute abnormality.   Electronically Signed   By: Marlan Palau M.D.   On: 03/05/2014 14:34    Code Status: Full Family Communication: Pt at bedside Disposition Plan: Admit for further evaluation  Manson Passey, MD  [facility] Pager [phone]  Review of Systems:  Constitutional: positive for fever HENT: Negative for hearing loss, ear pain, nosebleeds, congestion, sore throat, neck pain, tinnitus and ear discharge.   Eyes: Negative for blurred vision, double vision, photophobia, pain, discharge and redness.  Respiratory: Negative for cough, hemoptysis, sputum production, shortness of breath, wheezing and stridor.   Cardiovascular: Negative for chest pain, palpitations, orthopnea, claudication and leg swelling.  Gastrointestinal: Negative for nausea, vomiting and abdominal pain. Negative for heartburn, constipation, blood in stool and melena.  Genitourinary: Negative for hematuria and flank pain.  Musculoskeletal: Negative for myalgias, back pain, joint pain and falls.  Skin: Negative for itching and rash.  Neurological: Negative for dizziness and weakness. Endo/Heme/Allergies: Negative for environmental allergies and polydipsia.      Past Medical History  Diagnosis Date  . Hypertension   . Tremor   . History  of gallstones   . H/O urinary tract infection   . Urine incontinence   . Parkinson disease    Past Surgical History  Procedure Laterality Date  . Shoulder surgery     . Knee surgery    . Colonoscopy     Social History:  reports that she has never smoked. She has never used smokeless tobacco. She reports that she does not drink alcohol or use illicit drugs.  No Known Allergies  Family History  Problem Relation Age of Onset  . Diabetes Father   . Colon cancer Neg Hx   . Birth defects Neg Hx   . Heart disease Neg Hx   . Stroke Neg Hx      Prior to Admission medications   Medication Sig Start Date End Date Taking? Authorizing Provider  carbidopa-levodopa (SINEMET CR) 50-200 MG per tablet Take 1 tablet by mouth at bedtime. 08/24/13  Yes Rebecca S Tat, DO  carbidopa-levodopa (SINEMET) 25-100 MG per tablet Take 1.5 tablets by mouth 3 (three) times daily. 08/24/13 08/24/14 Yes Rebecca S Tat, DO  carvedilol (COREG) 3.125 MG tablet Take 1 tablet (3.125 mg total) by mouth 2 (two) times daily with a meal. 01/31/14  Yes Shanker Levora DredgeM Ghimire, MD  ciprofloxacin (CIPRO) 250 MG tablet Take 250 mg by mouth 2 (two) times daily.   Yes Historical Provider, MD  lactose free nutrition (BOOST PLUS) LIQD Take 237 mLs by mouth 3 (three) times daily with meals. 01/31/14  Yes Shanker Levora DredgeM Ghimire, MD  mirtazapine (REMERON) 7.5 MG tablet Take 7.5 mg by mouth at bedtime.   Yes Historical Provider, MD  QUEtiapine (SEROQUEL) 25 MG tablet Take 12.5 mg by mouth at bedtime.    Yes Historical Provider, MD  saccharomyces boulardii (FLORASTOR) 250 MG capsule Take 250 mg by mouth 2 (two) times daily.   Yes Historical Provider, MD   Physical Exam: Filed Vitals:   02/16/2014 1352 02/08/2014 1421 02/27/2014 1654  BP: 129/80  147/80  Pulse: 100  92  Temp: 100.9 F (38.3 C) 99.9 F (37.7 C) 100.5 F (38.1 C)  TempSrc: Oral Rectal Oral  Resp: 20  25  SpO2: 91%  92%    Physical Exam  Constitutional: Appears in no acute distress.  HENT: Normocephalic. Dry mucus membranes  Eyes: Conjunctivae and EOM are normal. PERRLA, no scleral icterus.  Neck: Normal ROM. Neck supple. No JVD. CVS: RRR, S1/S2  appreciated  Pulmonary: Effort and breath sounds normal, no stridor, rhonchi, wheezes Abdominal: Soft. BS +,  no distension, tenderness, rebound or guarding.  Musculoskeletal: Normal range of motion. No edema and no tenderness.  Lymphadenopathy: No lymphadenopathy noted, cervical, inguinal. Neuro: Alert. No focal neurologic deficits  Skin: Skin is warm and dry. No rash noted. Not diaphoretic.   Labs on Admission:  Basic Metabolic Panel:  Recent Labs Lab 02/19/2014 1447  NA 149*  K 4.0  CL 107  CO2 25  GLUCOSE 132*  BUN 47*  CREATININE 0.61  CALCIUM 9.9   Liver Function Tests: No results found for this basename: AST, ALT, ALKPHOS, BILITOT, PROT, ALBUMIN,  in the last 168 hours No results found for this basename: LIPASE, AMYLASE,  in the last 168 hours No results found for this basename: AMMONIA,  in the last 168 hours CBC:  Recent Labs Lab 02/27/2014 1447  WBC 9.0  NEUTROABS 7.1  HGB 12.2  HCT 37.1  MCV 92.8  PLT 242   Cardiac Enzymes: No results found for this basename: CKTOTAL, CKMB, CKMBINDEX,  TROPONINI,  in the last 168 hours BNP: No components found with this basename: POCBNP,  CBG: No results found for this basename: GLUCAP,  in the last 168 hours  If 7PM-7AM, please contact night-coverage [email or web address] Password [facility] Mar 18, 2014, 5:06 PM

## 2014-02-20 NOTE — Telephone Encounter (Signed)
Pt's daughter would like to speak to a nurse. She would like to request for [facility]

## 2014-02-20 NOTE — ED Notes (Signed)
Pt will not open mouth or swallow any fluids to do stroke swallow screen.

## 2014-02-20 NOTE — Telephone Encounter (Signed)
Left message on machine for patient's daughter to call back. We can not, as previously documented numerous times, move patient from her nursing facility. This is up to the family to decide on placement.

## 2014-02-20 NOTE — ED Notes (Addendum)
Per EMS: pt from [facility], Pt has low grade fever, possible UTI. Pt has not eating in two days or taking meds as prescribed either holds meds in mouth or spits them out.

## 2014-02-20 NOTE — Progress Notes (Signed)
Attempted to call and receive report from ED RN twice.

## 2014-02-20 NOTE — ED Notes (Signed)
Bed: WA25 Expected date:  Expected time:  Means of arrival:  Comments: EMS-UTI 

## 2014-02-20 NOTE — Telephone Encounter (Signed)
[name] called back and I made her aware it is up to them to transfer her out of the nursing facility if they are not happy with the care. She, again, expressed understanding.

## 2014-02-21 DIAGNOSIS — E43 Unspecified severe protein-calorie malnutrition: Secondary | ICD-10-CM | POA: Insufficient documentation

## 2014-02-21 DIAGNOSIS — R1310 Dysphagia, unspecified: Secondary | ICD-10-CM

## 2014-02-21 LAB — CBC
HCT: 36.6 % (ref 36.0–46.0)
Hemoglobin: 11.5 g/dL — ABNORMAL LOW (ref 12.0–15.0)
MCH: 30 pg (ref 26.0–34.0)
MCHC: 31.4 g/dL (ref 30.0–36.0)
MCV: 95.6 fL (ref 78.0–100.0)
Platelets: 238 10*3/uL (ref 150–400)
RBC: 3.83 MIL/uL — ABNORMAL LOW (ref 3.87–5.11)
RDW: 13.8 % (ref 11.5–15.5)
WBC: 10.5 10*3/uL (ref 4.0–10.5)

## 2014-02-21 LAB — COMPREHENSIVE METABOLIC PANEL
ALBUMIN: 2.5 g/dL — AB (ref 3.5–5.2)
ALK PHOS: 72 U/L (ref 39–117)
ALT: 17 U/L (ref 0–35)
AST: 26 U/L (ref 0–37)
BUN: 47 mg/dL — ABNORMAL HIGH (ref 6–23)
CO2: 26 mEq/L (ref 19–32)
Calcium: 9.4 mg/dL (ref 8.4–10.5)
Chloride: 108 mEq/L (ref 96–112)
Creatinine, Ser: 0.56 mg/dL (ref 0.50–1.10)
GFR calc Af Amer: >90 mL/min (ref >90)
GFR calc non Af Amer: 88 mL/min — ABNORMAL LOW (ref >90)
GLUCOSE: 129 mg/dL — AB (ref 70–99)
POTASSIUM: 3.6 meq/L — AB (ref 3.7–5.3)
SODIUM: 150 meq/L — AB (ref 137–147)
TOTAL PROTEIN: 7 g/dL (ref 6.0–8.3)
Total Bilirubin: 0.8 mg/dL (ref 0.3–1.2)

## 2014-02-21 LAB — GLUCOSE, CAPILLARY: Glucose-Capillary: 118 mg/dL — ABNORMAL HIGH (ref 70–99)

## 2014-02-21 MED ORDER — POTASSIUM CHLORIDE 10 MEQ/100ML IV SOLN
10.0000 meq | INTRAVENOUS | Status: AC
  Administered 2014-02-21 (×3): 10 meq via INTRAVENOUS
  Filled 2014-02-21 (×3): qty 100

## 2014-02-21 MED ORDER — DEXTROSE 5 % IV SOLN
INTRAVENOUS | Status: DC
  Administered 2014-02-21 – 2014-02-24 (×4): via INTRAVENOUS
  Filled 2014-02-21: qty 1000

## 2014-02-21 NOTE — ED Provider Notes (Signed)
Medical screening examination/treatment/procedure(s) were conducted as a shared visit with non-physician practitioner(s) and myself.  I personally evaluated the patient during the encounter.   EKG Interpretation   Date/Time:  Wednesday February 20 2014 15:42:28 EDT Ventricular Rate:  91 PR Interval:  126 QRS Duration: 83 QT Interval:  339 QTC Calculation: 417 R Axis:   70 Text Interpretation:  Sinus rhythm Ventricular premature complex Consider  left ventricular hypertrophy No prior to compare to Confirmed by Gwendolyn GrantWALDEN   MD, Mehmet Scally ([id number]) on 02/19/2014 3:57:36 PM       73F presents with worsening mental status. Demented as baseline. Looks dehydrated here. Hx of UTIs. UTI today. Fluids given, will admit.   Dagmar HaitWilliam Rossi Silvestro, MD 02/21/14 [phone]

## 2014-02-21 NOTE — Progress Notes (Addendum)
[facility] PROGRESS NOTE  Meagan [name] ZOX:[id number] DOB: 08-May-1935 DOA: 02/16/2014 PCP: NickiGwendolyn Lima ReaperBAITY, REGINA, NP  Brief narrative: 79 y.o. female, with history of Parkinson's disease, dementia, hypertension, recent admission in 01/2014 for altered mental status and UTI who now [facility] SNF [facility] 03[facility]01/2014 due to poor oral intake, low grade fevers and questionable UTI. She does have history of recurrent UTI's. No reports of cough, vomiting. No respiratory distress. In ED, BP was 126/64, HR 86, Tmax 100.9 F and oxygen saturation 91% on room air. Blood work revealed sodium of 149 otherwise unremarkable. CXr did not reveal acute cardiopulmonary findings. CT head did not show acute intracranial findings. UA showed many bacteria and too numerous to count WBC. She was started on cipro in ED.   Assessment and Plan:   Principal Problem:  UTI (lower urinary tract infection)  - she was already on cipro in SNF so wechanged abx to rocephin 1 gm IV daily  - follow up urine culture results  Active Problems:  Hypernatremia  - secondary to dehydration  - Sodium trending up. Change IV fluids from normal saline to free water - Follow up BMP in the morning Thick mucus in mouth - suction/remove what is possible; obtain SLP Parkinson disease  - continue sinemet per home regimen  - PT evaluation once pt able to participate  HTN (hypertension)  - continue coreg 3.125 mg PO BID  - BP this am 132/ 68 Severe protein calorie malnutrition - secondary to progressive dementia - needs SLP evaluation   Manson PasseyEVINE, Paddy Walthall, MD  [facility] Pager [phone]  If 7PM-7AM, please contact night-coverage [email or web address] Password [email or web address] 02/21/2014, 11:41 AM   LOS: 1 day   Consultants:  None  Non- medical consultants:  PT  SLP   Procedures:  None   Antibiotics:  Rocephin 02/19/2014 -->  HPI/Subjective: Per RN report, pt had lot of thick mucus in mouth, does not swallow pills.    Objective: Filed Vitals:   02/27/2014 1654 02/19/2014 1720 02/21/2014 2051 02/21/14 0502  BP: 147/80 126/64 127/70 132/68  Pulse: 92 90 86 87  Temp: 100.5 F (38.1 C) 98.9 F (37.2 C) 99.1 F (37.3 C) 99 F (37.2 C)  TempSrc: Oral Oral Oral Oral  Resp: 25 18 20 18   SpO2: 92% 94% 95% 98%    Intake/Output Summary (Last 24 hours) at 02/21/14 1141 Last data filed at 02/21/14 1030  Gross per 24 hour  Intake      0 ml  Output     15 ml  Net    -15 ml    Exam:   General:  Pt is sleeping this am, no acute distress  Cardiovascular: Regular rate and rhythm, S1/S2 appreciated   Respiratory: Clear to auscultation bilaterally, no wheezing, no crackles, no rhonchi  Abdomen: Soft, non tender, non distended, bowel sounds present, no guarding  Extremities: No edema, pulses DP and PT palpable bilaterally  Neuro: Grossly nonfocal  Data Reviewed: Basic Metabolic Panel:  Recent Labs Lab 03/03/2014 1447 02/21/14 0305  NA 149* 150*  K 4.0 3.6*  CL 107 108  CO2 25 26  GLUCOSE 132* 129*  BUN 47* 47*  CREATININE 0.61 0.56  CALCIUM 9.9 9.4   Liver Function Tests:  Recent Labs Lab 02/21/14 0305  AST 26  ALT 17  ALKPHOS 72  BILITOT 0.8  PROT 7.0  ALBUMIN 2.5*   No results found for this basename: LIPASE, AMYLASE,  in the last 168 hours No results found for  this basename: AMMONIA,  in the last 168 hours CBC:  Recent Labs Lab 02-25-2014 1447 02/21/14 0305  WBC 9.0 10.5  NEUTROABS 7.1  --   HGB 12.2 11.5*  HCT 37.1 36.6  MCV 92.8 95.6  PLT 242 238   Cardiac Enzymes: No results found for this basename: CKTOTAL, CKMB, CKMBINDEX, TROPONINI,  in the last 168 hours BNP: No components found with this basename: POCBNP,  CBG:  Recent Labs Lab 02/21/14 0725  GLUCAP 118*    MRSA PCR SCREENING     Status: None   Collection Time    02-25-2014  5:52 PM      Result Value Ref Range Status   MRSA by PCR NEGATIVE  NEGATIVE Final     Studies: Dg Chest 2 View  2014-02-25    CLINICAL DATA:  SOB  EXAM: CHEST  2 VIEW  COMPARISON:  DG CHEST 2 VIEW dated 01/27/2014  FINDINGS: There is elevation of the left hemidiaphragm. Cardiac silhouette is within normal limits. There is no evidence of focal infiltrates, effusions or edema. Degenerative changes appreciated involving the left shoulder.  IMPRESSION: No evidence of acute cardiopulmonary disease.   Electronically Signed   By: Salome Holmes M.D.   On: 2014/02/25 15:11   Ct Head Wo Contrast  25-Feb-2014   CLINICAL DATA:  Severe headache  EXAM: CT HEAD WITHOUT CONTRAST  TECHNIQUE: Contiguous axial images were obtained from the base of the skull through the vertex without intravenous contrast.  COMPARISON:  None.  FINDINGS: Mild atrophy. Mild ventricular enlargement consistent with atrophy. Mild chronic microvascular ischemia. No acute infarct. Negative for hemorrhage or mass. Visualized sinuses are clear. Left vertebral artery calcification. Cavernous carotid calcification.  IMPRESSION: Atrophy and chronic microvascular ischemia.  No acute abnormality.   Electronically Signed   By: Marlan Palau M.D.   On: 2014-02-25 14:34    Scheduled Meds: . carbidopa-levodopa  1 tablet Oral QHS  . carbidopa-levodopa  1.5 tablet Oral TID WC  . carvedilol  3.125 mg Oral BID WC  . cefTRIAXone   1 g Intravenous Q24H  . lactose free nutrition  237 mL Oral TID WC  . mirtazapine  7.5 mg Oral QHS  . potassium chloride  10 mEq Intravenous Q1 Hr x 3  . QUEtiapine  12.5 mg Oral QHS  . saccharomyces boulardii  250 mg Oral BID   Continuous Infusions: . sodium chloride 75 mL/hr at 02/21/14 0701

## 2014-02-21 NOTE — Progress Notes (Signed)
Clinical Social Work Department BRIEF PSYCHOSOCIAL ASSESSMENT 02/21/2014  Patient:  Meagan [name],Meagan [name]     Account Number:  [id number]     Admit date:  02/27/2014  Clinical Social Worker:  Jacelyn GripBYRD,SUZANNA, LCSWA  Date/Time:  02/21/2014 04:12 PM  Referred by:  Physician  Date Referred:  02/21/2014 Referred for  SNF Placement   Other Referral:   Interview type:  Family Other interview type:    PSYCHOSOCIAL DATA Living Status:  FACILITY Admitted from facility:  [facility] [place] Level of care:  Skilled Nursing Facility Primary support name:  Meagan [name]/[phone] Primary support relationship to patient:  CHILD, ADULT Degree of support available:   strong    CURRENT CONCERNS Current Concerns  Post-Acute Placement   Other Concerns:    SOCIAL WORK ASSESSMENT / PLAN CSW received [facility].    CSW received return phone call from pt daughter, Meagan CosierCrystal. Pt daughter confirmed that [facility] and expressed her disappointment in the facility and the care that they were providing to pt. Pt daughter discussed that she felt that facility would just leave pt lying in the bed at the facility and did not provide adequate nursing care. Pt daughter discussed that she does not want pt to [facility] and is unsure that she wants to explore other options for SNF facilities at this time as she explains that she now has [name] clouded view of SNF facilities in general. CSW validated pt feelings and explored with pt daughter what she felt best plan would be. Pt daughter discussed that she would like to look into option of getting equipment in the home and her providing 24 hour care for pt at home at discharge. CSW discussed that RNCM can assist with these questions and arrangements and pt daughter agreeable to meeting with RNCM and CSW tomorrow. Pt daughter would like to  await discussion tomorrow before agreeing to exploring other SNF options as [name] possible disposition plan.    CSW notified RNCM and RNCM and CSW plan to meet with pt daughter tomorrow to further discuss.    CSW to continue to follow to provide support and assist with pt disposition needs.   Assessment/plan status:  Psychosocial Support/Ongoing Assessment of Needs Other assessment/ plan:   discharge planning   Information/referral to community resources:   [facility], other referrals pending at this time as pt daughter wished to [facility] and CSW tomorrow to further discuss.    PATIENT'S/FAMILY'S RESPONSE TO PLAN OF CARE: Per chart, pt disoriented x 4. Pt daughter reflected on the poor care pt daughter feels that pt had been receiving [facility] and wants to explore option of caring for pt at home. Pt daughter appears supportive and actively involved and appears to be acting in the best interest in pt in regard to disposition plan.   [name], MSW, LCSW Clinical Social Work [phone]

## 2014-02-21 NOTE — Progress Notes (Signed)
Pt refusing  to open her mouth to swallow ,this RN assessed pt's mouth and found that patient had  thick dry mucous stuck in the mouth ,arround dentures and all the way to back of the mouth, oral care given , after that pt started spitting up big blobs of thick white mucous. Will keep monitoring .

## 2014-02-21 NOTE — Progress Notes (Signed)
CSW received [facility]  CSW reviewed chart and noted that pt has memory impairment and per chart, pt disoriented x 4 at this time.  CSW contacted pt daughter, [name] via telephone and left voice message.  CSW to await return phone call from pt daughter and complete full psychosocial assessment at that time.  Loletta SpecterSuzanna Nyree Applegate, MSW, LCSW Clinical Social Work [phone]

## 2014-02-21 NOTE — Progress Notes (Signed)
INITIAL NUTRITION ASSESSMENT  Pt meets criteria for SEVERE malnutrition in the context of acute illness/injury as evidenced by energy intake </= 50% for >/= 5 days and severe fat and muscle mass loss with 14.5% weight loss in the past month.  DOCUMENTATION CODES Per approved criteria  -Severe malnutrition in the context of acute illness/injury -Underweight   INTERVENTION: Continue Boost Plus TID.  Diet recommendations per SLP evaluation  Pt at risk for refeeding syndrome, monitor magnesium, phosphorous, and potassium for 3 days.  Will continue to monitor.  NUTRITION DIAGNOSIS: Inadequate oral intake related to swallowing difficulties as evidenced by poor PO intake.   Goal: Pt to meet >/= 90% of their estimated nutrition needs.  Monitor:  PO intake, weight trends, labs, I/O's, Diet recommendation per SLP  Reason for Assessment: MST  79 y.o. female  Admitting Dx: UTI (lower urinary tract infection)  ASSESSMENT: 79 y.o. female, with history of Parkinson's disease, dementia, hypertension, recent admission in 01/2014 for altered mental status and UTI who now [facility] SNF [facility]2014 due to poor oral intake, low grade fevers and questionable UTI.  Unable to obtain nutrition hx from pt due pt's current state of unresponsiveness. Daughter was not present during time of visit. Per Nurse tech, daughter reported pt has not had anything to eat or drink since 3/14. Daughter reports pt has swallowing difficulty. Meal completion 0%.  Pt is contracted. Pt with significant fat and muscle mass loss.  Nutrition Focused Physical Exam:  Subcutaneous Fat:  Orbital Region: N/A Upper Arm Region: Severe depletion Thoracic and Lumbar Region: N/A  Muscle:  Temple Region: N/A Clavicle Bone Region: Severe depletion Clavicle and Acromion Bone Region: Severe depletion Scapular Bone Region: N/A Dorsal Hand: N/A Patellar Region: N/A Anterior Thigh Region: N/A Posterior Calf Region:  N/A  Edema: no issues noted  Labs: Low potassium (getting IV replacement), albumin, and GFR High sodium, glucose, and BUN  Height: Ht Readings from Last 1 Encounters:  02/08/14 5\' 10"  (1.778 m)    Weight: 02/21/14           112 7 oz (51 kg)  Ideal Body Weight: 150 lb  % Ideal Body Weight: 75%  Wt Readings from Last 10 Encounters:  02/08/14 131 lb (59.421 kg)  01/31/14 131 lb 6.3 oz (59.6 kg)  11/23/13 135 lb 9.6 oz (61.508 kg)  08/24/13 140 lb 4.8 oz (63.64 kg)  08/10/13 142 lb (64.411 kg)  08/03/13 141 lb (63.957 kg)  06/15/13 144 lb (65.318 kg)  07/14/12 160 lb (72.576 kg)  06/14/12 167 lb 12.3 oz (76.1 kg)  06/07/12 187 lb 6.3 oz (85 kg)    Usual Body Weight: 140 lb  % Usual Body Weight: 80%  BMI:  16 kg/(m^2).  Underweight   Estimated Nutritional Needs: Kcal:  1700-1900 Protein: 70-80 grams Fluid: 1.7- 1.9 L/day  Skin: no issues noted  Diet Order: Parke SimmersBland  EDUCATION NEEDS: -Education not appropriate at this time   Intake/Output Summary (Last 24 hours) at 02/21/14 1004 Last data filed at 02/23/2014 1410  Gross per 24 hour  Intake      0 ml  Output     15 ml  Net    -15 ml    Last BM: 3/17   Labs:   Recent Labs Lab 02/21/2014 1447 02/21/14 0305  NA 149* 150*  K 4.0 3.6*  CL 107 108  CO2 25 26  BUN 47* 47*  CREATININE 0.61 0.56  CALCIUM 9.9 9.4  GLUCOSE  132* 129*    CBG (last 3)   Recent Labs  02/21/14 0725  GLUCAP 118*    Scheduled Meds: . carbidopa-levodopa  1 tablet Oral QHS  . carbidopa-levodopa  1.5 tablet Oral TID WC  . carvedilol  3.125 mg Oral BID WC  . cefTRIAXone (ROCEPHIN)  IV  1 g Intravenous Q24H  . lactose free nutrition  237 mL Oral TID WC  . mirtazapine  7.5 mg Oral QHS  . QUEtiapine  12.5 mg Oral QHS  . saccharomyces boulardii  250 mg Oral BID    Continuous Infusions: . sodium chloride 75 mL/hr at 02/21/14 0701    Past Medical History  Diagnosis Date  . Hypertension   . Tremor   . History of gallstones    . H/O urinary tract infection   . Urine incontinence   . Parkinson disease     Past Surgical History  Procedure Laterality Date  . Shoulder surgery    . Knee surgery    . Colonoscopy      Marijean Niemann Dietetic Intern Pager: [phone] MS, RD, LDN [phone] Pager [phone] After Hours Pager

## 2014-02-21 NOTE — Progress Notes (Signed)
Phlebotomy called staff to room to report that patient had vomited. Patient had actually spit out a large amount of thick yellowish phlegm on pillow with some still in her mouth. Set up suction and used suction to remove more thick secretions from mouth.

## 2014-02-22 LAB — GLUCOSE, CAPILLARY: Glucose-Capillary: 118 mg/dL — ABNORMAL HIGH (ref 70–99)

## 2014-02-22 NOTE — Progress Notes (Signed)
  Thank you for consulting the Palliative Medicine Team at [facility] to meet your patient's and family's needs.   The reason that you asked us to see your patient is  For Clarification of GOC and options  We have scheduled your patient for a meeting: Tomorrow morning at 1000 with Dr Derenda MisMelissa Taylor  The Surrogate decision make is: Rudene Christiansrystal Malone (will be present at bedside)  Contact information:  C# [phone]  Other family members that need to be present: possibly by Skype  Your patient is able/unable to participate: unable  Lorinda CreedMary Talah Cookston NP  Palliative Medicine Team Team Phone # [phone] Pager [phone]

## 2014-02-22 NOTE — Care Management Note (Signed)
Cm spoke with patient's adult daughter at the bedside concerning dc planning with CSW present. Pt's daughter request Palliative Care Consult. Cm to notify MD. Pt's daughter states disposition for patient is home. Cm provided pt's daughter with information on home health, home hospice, and dme. Pt's daughter states family plans to [place] where there is more family support. Will continue to follow.    Roxy Mannsymeeka Tashauna Caisse,MSN,RN [phone]

## 2014-02-22 NOTE — Progress Notes (Signed)
CSW continuing to follow.  CSW and RNCM met with pt daughter at bedside as pt daughter had requested meeting in order to explore potential of pt returning home as pt daughter does not want pt to return to [facility] and hesitant to explore other SNF options.   Discussion held with pt daughter regarding goals and disposition plans. Pt daughter expressed concern in pt nutrition and RNCM discussed artificial nutrition options. Pt daughter contacted pt son in [place] via telephone to be a part of conversation. Pt children want to discuss all options regarding pt goals of care and nutrition and RNCM and CSW recommended PMT [place] meeting. Pt children feel that this meeting will be beneficial in order to determine goals and make decision regarding pt plan of care which will also assist with disposition plans.  Pt daughter discussed wishes for pt to return home and is hopeful that pt can return to [place] closer to other family.    RNCM to notify MD to request PMT [place] consult to be placed.  Contact information for CSW and RNCM provided to pt daughter.  CSW to follow up following PMT [place] meeting and recommendations and assist as appropriate.  CSW to continue to follow.  Alison Murray, MSW, [place] [facility] [phone]

## 2014-02-22 NOTE — Evaluation (Signed)
Clinical/Bedside Swallow Evaluation Patient Details  Name: Meagan [name] MRN: [id number] Date of Birth: Sep 04, 1935  Today's Date: 02/22/2014 Time: [id number] SLP Time Calculation (min): 30 min  Past Medical History:  Past Medical History  Diagnosis Date  . Hypertension   . Tremor   . History of gallstones   . H/O urinary tract infection   . Urine incontinence   . Parkinson disease    Past Surgical History:  Past Surgical History  Procedure Laterality Date  . Shoulder surgery    . Knee surgery    . Colonoscopy     HPI:  79 year old female admitted 3/18 due to fever, poor po intake, AMS.  PMH significant for Parkinson's, dementia, HTN.  BSE ordered to determine safe po.   Assessment / Plan / Recommendation Clinical Impression  Upper and lower dentures were removed, cleaned, and placed in denture cup with water.  Oral care was completed with suction.  Dried mucus and dry skin was removed from pt's oral cavity and lips, moisturizer applied.  Despite verbal and tactile stimulation during oral care, pt did not become sufficiently alert enough to attempt po trials.    Aspiration Risk  Moderate    Diet Recommendation NPO   Medication Administration: Via alternative means    Other  Recommendations Other Recommendations: Have oral suction available   Follow Up Recommendations   (TBD)    Frequency and Duration min 1 x/week  1 week   Pertinent Vitals/Pain VSS, 1 on PAINAD scale    SLP Swallow Goals   demonstrate po readiness   Swallow Study Prior Functional Status   hx parkinson's and dementia    General Date of Onset: January 14, 2014 HPI: 79 year old female admitted 3/18 due to fever, poor po intake, AMS.  PMH significant for Parkinson's, dementia, HTN.  BSE ordered to determine safe po. Type of Study: Bedside swallow evaluation Previous Swallow Assessment: n/a Diet Prior to this Study: Dysphagia 3 (soft);Thin liquids Temperature Spikes Noted: No Respiratory Status:  Room air History of Recent Intubation: No Behavior/Cognition: Cooperative;Lethargic;Requires cueing;Doesn't follow directions Oral Cavity - Dentition: Dentures, top;Dentures, bottom Self-Feeding Abilities: Total assist Patient Positioning: Partially reclined Baseline Vocal Quality: Aphonic Volitional Cough: Cognitively unable to elicit Volitional Swallow: Unable to elicit    Oral/Motor/Sensory Function Overall Oral Motor/Sensory Function: Other (comment) (unable to assess)   Ice Chips Ice chips: Not tested   Thin Liquid Thin Liquid: Not tested    Nectar Thick Nectar Thick Liquid: Not tested   Honey Thick Honey Thick Liquid: Not tested   Puree Puree: Not tested   Solid   GO   Jaicion Laurie B. Murvin NatalBueche, [facility]P, CCC-SLP [phone] Solid: Not tested       Leigh AuroraBueche, Shweta Aman Brown 02/22/2014,10:27 AM

## 2014-02-22 NOTE — Progress Notes (Signed)
[facility] PROGRESS NOTE  Meagan [name] ZOX:[id number] DOB: September 09, 1935 DOA: 03/12/14 PCP: Nicki Reaper, NP  Brief narrative: 79 y.o. female, with history of Parkinson's disease, dementia, hypertension, recent admission in 01/2014 for altered mental status and UTI who now presented from SNF to [facility] ED 2014/03/12 due to poor oral intake, low grade fevers and questionable UTI. She does have history of recurrent UTI's. No reports of cough, vomiting. No respiratory distress.  In ED, BP was 126/64, HR 86, Tmax 100.9 F and oxygen saturation 91% on room air. Blood work revealed sodium of 149 otherwise unremarkable. CXr did not reveal acute cardiopulmonary findings. CT head did not show acute intracranial findings. UA showed many bacteria and too numerous to count WBC. She was started on cipro in ED. But this was changed to rocephin by [facility] as pt was previously on cipro and has developed UTI.  Assessment and Plan:   Principal Problem:  UTI (lower urinary tract infection)  - continue rocephin 1 gm IV daily  - follow up urine culture results - had to be re-incubated for better growth  Active Problems:  Hypernatremia  - secondary to dehydration  - Sodium trending up so we changed IV fluids to free water - follow up BMP in am Thick mucus in mouth  - suction/remove what is possible; SLP evaluation pending Parkinson disease  - continue sinemet per home regimen  - PT evaluation once pt able to participate  HTN (hypertension)  - continue coreg 3.125 mg PO BID  Severe protein calorie malnutrition  - secondary to progressive dementia  - needs SLP evaluation - pending at this time  Consultants:  None Non- medical consultants:  PT  SLP  Procedures:  None  Antibiotics:  Rocephin 03-12-2014 -->   Manson Passey, MD  [facility] Pager [phone]  If 7PM-7AM, please contact night-coverage [email or web address] Password [facility] 02/22/2014, 9:32 AM   LOS: 2 days    HPI/Subjective: No  acute overnight events.   Objective: Filed Vitals:   02/21/14 1317 02/21/14 1505 02/21/14 2105 02/22/14 0521  BP:  126/63 118/64 121/63  Pulse:  75 85 90  Temp:  99.7 F (37.6 C) 98.4 F (36.9 C) 101.5 F (38.6 C)  TempSrc:  Oral Axillary Axillary  Resp:  17 18 20   Height: 5\' 10"  (1.778 m)     Weight: 51 kg (112 lb 7 oz)     SpO2:  90% 93% 90%    Intake/Output Summary (Last 24 hours) at 02/22/14 0932 Last data filed at 02/21/14 1823  Gross per 24 hour  Intake      0 ml  Output      0 ml  Net      0 ml    Exam:  General: Pt is sleeping this morning, briefly awakes when called her name; no acute distress Cardiovascular: Regular rate and rhythm, S1/S2 appreciated  Respiratory: some coarse breath sounds in upper lung lobes otherwise bilateral air entry  Abdomen: Soft, non tender, non distended, bowel sounds present, no guarding  Extremities: No edema, pulses DP and PT palpable bilaterally  Neuro: Grossly nonfocal  Data Reviewed: Basic Metabolic Panel:  Recent Labs Lab Mar 12, 2014 1447 02/21/14 0305  NA 149* 150*  K 4.0 3.6*  CL 107 108  CO2 25 26  GLUCOSE 132* 129*  BUN 47* 47*  CREATININE 0.61 0.56  CALCIUM 9.9 9.4   Liver Function Tests:  Recent Labs Lab 02/21/14 0305  AST 26  ALT 17  ALKPHOS 72  BILITOT 0.8  PROT 7.0  ALBUMIN 2.5*   No results found for this basename: LIPASE, AMYLASE,  in the last 168 hours No results found for this basename: AMMONIA,  in the last 168 hours CBC:  Recent Labs Lab Mar 15, 2014 1447 02/21/14 0305  WBC 9.0 10.5  NEUTROABS 7.1  --   HGB 12.2 11.5*  HCT 37.1 36.6  MCV 92.8 95.6  PLT 242 238   Cardiac Enzymes: No results found for this basename: CKTOTAL, CKMB, CKMBINDEX, TROPONINI,  in the last 168 hours BNP: No components found with this basename: POCBNP,  CBG:  Recent Labs Lab 02/21/14 0725 02/22/14 0727  GLUCAP 118* 118*    URINE CULTURE     Status: None   Collection Time    Mar 15, 2014  3:46 PM       Result Value Ref Range Status   Specimen Description URINE, CATHETERIZED   Final   Value: Culture reincubated for better growth     Performed at [facility]   Report Status PENDING   Incomplete  MRSA PCR SCREENING     Status: None   Collection Time    Mar 15, 2014  5:52 PM      Result Value Ref Range Status   MRSA by PCR NEGATIVE  NEGATIVE Final     Studies: Dg Chest 2 View Jan 25, 2014    IMPRESSION: No evidence of acute cardiopulmonary disease.     Ct Head Wo Contrast Jan 25, 2014   IMPRESSION: Atrophy and chronic microvascular ischemia.  No acute abnormality.      Scheduled Meds: . carbidopa-levodopa  1 tablet Oral QHS  . carbidopa-levodopa  1.5 tablet Oral TID WC  . carvedilol  3.125 mg Oral BID WC  . cefTRIAXone (ROCEPHIN)  IV  1 g Intravenous Q24H  . lactose free nutrition  237 mL Oral TID WC  . mirtazapine  7.5 mg Oral QHS  . QUEtiapine  12.5 mg Oral QHS  . saccharomyces boulardii  250 mg Oral BID   Continuous Infusions: . dextrose 75 mL/hr at 02/21/14 1214

## 2014-02-23 LAB — BASIC METABOLIC PANEL
BUN: 22 mg/dL (ref 6–23)
CALCIUM: 8.7 mg/dL (ref 8.4–10.5)
CO2: 23 meq/L (ref 19–32)
Chloride: 110 mEq/L (ref 96–112)
Creatinine, Ser: 0.42 mg/dL — ABNORMAL LOW (ref 0.50–1.10)
GFR calc non Af Amer: >90 mL/min (ref >90)
Glucose, Bld: 127 mg/dL — ABNORMAL HIGH (ref 70–99)
Potassium: 3.5 mEq/L — ABNORMAL LOW (ref 3.7–5.3)
Sodium: 148 mEq/L — ABNORMAL HIGH (ref 137–147)

## 2014-02-23 LAB — URINE CULTURE: Colony Count: 30000

## 2014-02-23 LAB — GLUCOSE, CAPILLARY: Glucose-Capillary: 131 mg/dL — ABNORMAL HIGH (ref 70–99)

## 2014-02-23 MED ORDER — VITAMINS A & D EX OINT
TOPICAL_OINTMENT | CUTANEOUS | Status: AC
  Administered 2014-02-23: 1
  Filled 2014-02-23: qty 5

## 2014-02-23 NOTE — Progress Notes (Signed)
[facility] PROGRESS NOTE  Meagan [name] NFA:[id number] DOB: 09-17-1935 DOA: 02/07/2014 PCP: Nicki Reaper, NP  Brief narrative: 79 y.o. female, with history of Parkinson's disease, dementia, hypertension, recent admission in 01/2014 for altered mental status and UTI who now presented from SNF to [facility] ED 02/19/2014 due to poor oral intake, low grade fevers and questionable UTI. She does have history of recurrent UTI's. No reports of cough, vomiting. No respiratory distress.  In ED, BP was 126/64, HR 86, Tmax 100.9 F and oxygen saturation 91% on room air. Blood work revealed sodium of 149 otherwise unremarkable. CXr did not reveal acute cardiopulmonary findings. CT head did not show acute intracranial findings. UA showed many bacteria and too numerous to count WBC. She was started on cipro in ED. But this was changed to rocephin by [facility] as pt was previously on cipro and has developed UTI.   Assessment and Plan:   Principal Problem:  UTI (lower urinary tract infection)  - continue rocephin 1 gm IV daily  - follow up urine culture results - had to be re-incubated for better growth  Active Problems:  Hypernatremia  - secondary to dehydration  - Sodium trending up so we changed IV fluids to free water  - follow up BMP in am  Thick mucus in mouth  - suction/remove what is possible; SLP evaluation not done as pt not alert enough to participate  Parkinson disease  - continue sinemet per home regimen  - PT evaluation once pt able to participate  - appreciate PCT for GOC HTN (hypertension)  - continue coreg 3.125 mg PO BID  Severe protein calorie malnutrition  - secondary to progressive dementia   Code status: full code Family education: updated daughter at bedside  Disposition: remains inpatient  Consultants:  Palliative care Non- medical consultants:  PT  SLP  Procedures:  None  Antibiotics:  Rocephin 02/19/2014 -->  Manson Passey, MD  [facility] Pager  [phone]  If 7PM-7AM, please contact night-coverage [email or web address] Password [email or web address] 02/23/2014, 9:30 AM   LOS: 3 days    HPI/Subjective: No overnight events. Pt briefly awakes when called her name.  Objective: Filed Vitals:   02/22/14 0521 02/22/14 1407 02/22/14 2102 02/23/14 0535  BP: 121/63 131/69 131/62 129/63  Pulse: 90 81 95 92  Temp: 101.5 F (38.6 C) 97.6 F (36.4 C) 99.5 F (37.5 C) 102 F (38.9 C)  TempSrc: Axillary Axillary Axillary Axillary  Resp: 20 19 22 22   Height:      Weight:   51.12 kg (112 lb 11.2 oz)   SpO2: 90% 91% 94% 92%    Intake/Output Summary (Last 24 hours) at 02/23/14 0930 Last data filed at 02/23/14 0535  Gross per 24 hour  Intake      0 ml  Output      0 ml  Net      0 ml    Exam:   General:  Pt is sleeping, no acute distress  Cardiovascular: Regular rate and rhythm, S1/S2 appreciated   Respiratory: Clear to auscultation bilaterally, no wheezing, no crackles, no rhonchi  Abdomen: Soft, non tender, non distended, bowel sounds present  Extremities: No edema, pulses DP and PT palpable bilaterally  Neuro: Grossly nonfocal  Data Reviewed: Basic Metabolic Panel:  Recent Labs Lab 02/17/2014 1447 02/21/14 0305  NA 149* 150*  K 4.0 3.6*  CL 107 108  CO2 25 26  GLUCOSE 132* 129*  BUN 47* 47*  CREATININE 0.61 0.56  CALCIUM 9.9 9.4  Liver Function Tests:  Recent Labs Lab 02/21/14 0305  AST 26  ALT 17  ALKPHOS 72  BILITOT 0.8  PROT 7.0  ALBUMIN 2.5*   No results found for this basename: LIPASE, AMYLASE,  in the last 168 hours No results found for this basename: AMMONIA,  in the last 168 hours CBC:  Recent Labs Lab 04/11/14 1447 02/21/14 0305  WBC 9.0 10.5  NEUTROABS 7.1  --   HGB 12.2 11.5*  HCT 37.1 36.6  MCV 92.8 95.6  PLT 242 238   Cardiac Enzymes: No results found for this basename: CKTOTAL, CKMB, CKMBINDEX, TROPONINI,  in the last 168 hours BNP: No components found with this basename: POCBNP,   CBG:  Recent Labs Lab 02/21/14 0725 02/22/14 0727 02/23/14 0725  GLUCAP 118* 118* 131*    URINE CULTURE     Status: None   Collection Time    04/11/14  3:46 PM      Result Value Ref Range Status   Specimen Description URINE, CATHETERIZED   Final   Special Requests NONE   Final   Culture  Setup Time     Final   Value: 2014-10-06 21:57     Performed at [facility]   Colony Count PENDING   Incomplete   Culture     Final   Value: Culture reincubated for better growth     Performed at [facility]   Report Status PENDING   Incomplete  MRSA PCR SCREENING     Status: None   Collection Time    04/11/14  5:52 PM      Result Value Ref Range Status   MRSA by PCR NEGATIVE  NEGATIVE Final     Studies: No results found.  Scheduled Meds: . carbidopa-levodopa  1 tablet Oral QHS  . carbidopa-levodopa  1.5 tablet Oral TID WC  . carvedilol  3.125 mg Oral BID WC  . cefTRIAXone (ROCEPHIN)  IV  1 g Intravenous Q24H  . lactose free nutrition  237 mL Oral TID WC  . mirtazapine  7.5 mg Oral QHS  . QUEtiapine  12.5 mg Oral QHS  . saccharomyces boulardii  250 mg Oral BID   Continuous Infusions: . dextrose 75 mL/hr at 02/22/14 1814

## 2014-02-23 NOTE — Progress Notes (Signed)
SLP Cancellation Note  Patient Details Name: Gwendolyn Limalizabeth A Donlon MRN: [id number] DOB: [birth year]   Cancelled treatment:       Reason Eval/Treat Not Completed: Fatigue/lethargy limiting ability to participate.  Will follow for improvements in LOA.   Blenda MountsCouture, Snigdha Howser Laurice 02/23/2014, 3:34 PM

## 2014-02-23 NOTE — Progress Notes (Signed)
Patient ZO:[id number]:Meagan [name]      DOB: 1935-08-27      JWJ:[id number]  Daughter called and cancelled 10 am meeting Saturday. Requesting 1 pm Sunday.  Oyindamola Key L. Ladona Ridgelaylor, MD MBA The Palliative Medicine Team at [facility] Team Phone: [phone] Pager: [phone]

## 2014-02-24 ENCOUNTER — Inpatient Hospital Stay (HOSPITAL_COMMUNITY): Payer: Medicare Other

## 2014-02-24 DIAGNOSIS — I469 Cardiac arrest, cause unspecified: Secondary | ICD-10-CM | POA: Diagnosis present

## 2014-02-24 LAB — BLOOD GAS, ARTERIAL
ACID-BASE DEFICIT: 11.5 mmol/L — AB (ref 0.0–2.0)
Acid-base deficit: 6.6 mmol/L — ABNORMAL HIGH (ref 0.0–2.0)
BICARBONATE: 17.6 meq/L — AB (ref 20.0–24.0)
Bicarbonate: 15.7 mEq/L — ABNORMAL LOW (ref 20.0–24.0)
DRAWN BY: 11249
Drawn by: 11249
FIO2: 1 %
FIO2: 1 %
MECHVT: 400 mL
O2 SAT: 98.7 %
O2 SAT: 99.4 %
PCO2 ART: 43.8 mmHg (ref 35.0–45.0)
PEEP/CPAP: 8 cmH2O
PEEP: 8 cmH2O
Patient temperature: 98.6
Patient temperature: 98.6
RATE: 14 resp/min
RATE: 24 resp/min
TCO2: 15.6 mmol/L (ref 0–100)
TCO2: 16.4 mmol/L (ref 0–100)
VT: 540 mL
pCO2 arterial: 32.4 mmHg — ABNORMAL LOW (ref 35.0–45.0)
pH, Arterial: 7.181 — CL (ref 7.350–7.450)
pH, Arterial: 7.354 (ref 7.350–7.450)
pO2, Arterial: 276 mmHg — ABNORMAL HIGH (ref 80.0–100.0)
pO2, Arterial: 491 mmHg — ABNORMAL HIGH (ref 80.0–100.0)

## 2014-02-24 LAB — CBC WITH DIFFERENTIAL/PLATELET
BASOS PCT: 0 % (ref 0–1)
Basophils Absolute: 0 10*3/uL (ref 0.0–0.1)
EOS ABS: 0.1 10*3/uL (ref 0.0–0.7)
Eosinophils Relative: 1 % (ref 0–5)
HEMATOCRIT: 27 % — AB (ref 36.0–46.0)
HEMOGLOBIN: 8.4 g/dL — AB (ref 12.0–15.0)
LYMPHS ABS: 2 10*3/uL (ref 0.7–4.0)
LYMPHS PCT: 22 % (ref 12–46)
MCH: 30.4 pg (ref 26.0–34.0)
MCHC: 31.1 g/dL (ref 30.0–36.0)
MCV: 97.8 fL (ref 78.0–100.0)
Monocytes Absolute: 0.5 10*3/uL (ref 0.1–1.0)
Monocytes Relative: 5 % (ref 3–12)
NEUTROS ABS: 6.7 10*3/uL (ref 1.7–7.7)
Neutrophils Relative %: 72 % (ref 43–77)
Platelets: 179 10*3/uL (ref 150–400)
RBC: 2.76 MIL/uL — ABNORMAL LOW (ref 3.87–5.11)
RDW: 14.1 % (ref 11.5–15.5)
WBC: 9.3 10*3/uL (ref 4.0–10.5)

## 2014-02-24 LAB — COMPREHENSIVE METABOLIC PANEL
ALK PHOS: 139 U/L — AB (ref 39–117)
ALT: 100 U/L — ABNORMAL HIGH (ref 0–35)
AST: 205 U/L — ABNORMAL HIGH (ref 0–37)
Albumin: 1.3 g/dL — ABNORMAL LOW (ref 3.5–5.2)
BUN: 22 mg/dL (ref 6–23)
CO2: 15 meq/L — AB (ref 19–32)
Calcium: 8 mg/dL — ABNORMAL LOW (ref 8.4–10.5)
Chloride: 109 mEq/L (ref 96–112)
Creatinine, Ser: 0.62 mg/dL (ref 0.50–1.10)
GFR, EST NON AFRICAN AMERICAN: 85 mL/min — AB (ref >90)
GLUCOSE: 230 mg/dL — AB (ref 70–99)
Potassium: 3.8 mEq/L (ref 3.7–5.3)
SODIUM: 150 meq/L — AB (ref 137–147)
Total Bilirubin: 0.8 mg/dL (ref 0.3–1.2)
Total Protein: 4.9 g/dL — ABNORMAL LOW (ref 6.0–8.3)

## 2014-02-24 LAB — GLUCOSE, CAPILLARY
Glucose-Capillary: 127 mg/dL — ABNORMAL HIGH (ref 70–99)
Glucose-Capillary: 193 mg/dL — ABNORMAL HIGH (ref 70–99)

## 2014-02-24 LAB — LACTIC ACID, PLASMA: LACTIC ACID, VENOUS: 12 mmol/L — AB (ref 0.5–2.2)

## 2014-02-24 LAB — TROPONIN I

## 2014-02-24 MED ORDER — SODIUM CHLORIDE 0.9 % IV SOLN
25.0000 ug/h | INTRAVENOUS | Status: DC
  Administered 2014-02-24: 25 ug/h via INTRAVENOUS
  Filled 2014-02-24: qty 50

## 2014-02-24 MED ORDER — PANTOPRAZOLE SODIUM 40 MG IV SOLR
40.0000 mg | Freq: Every day | INTRAVENOUS | Status: DC
  Administered 2014-02-24 – 2014-02-25 (×2): 40 mg via INTRAVENOUS
  Filled 2014-02-24 (×3): qty 40

## 2014-02-24 MED ORDER — VANCOMYCIN HCL IN DEXTROSE 1-5 GM/200ML-% IV SOLN
1000.0000 mg | INTRAVENOUS | Status: DC
  Administered 2014-02-24 – 2014-02-25 (×2): 1000 mg via INTRAVENOUS
  Filled 2014-02-24 (×3): qty 200

## 2014-02-24 MED ORDER — SODIUM BICARBONATE 8.4 % IV SOLN
INTRAVENOUS | Status: AC
  Administered 2014-02-24: 1 meq/L
  Filled 2014-02-24: qty 50

## 2014-02-24 MED ORDER — SODIUM CHLORIDE 0.9 % IV SOLN
1000.0000 mg | Freq: Two times a day (BID) | INTRAVENOUS | Status: DC
  Administered 2014-02-24 – 2014-02-27 (×7): 1000 mg via INTRAVENOUS
  Filled 2014-02-24 (×8): qty 10

## 2014-02-24 MED ORDER — BIOTENE DRY MOUTH MT LIQD
15.0000 mL | Freq: Four times a day (QID) | OROMUCOSAL | Status: DC
  Administered 2014-02-25 – 2014-02-27 (×9): 15 mL via OROMUCOSAL

## 2014-02-24 MED ORDER — LORAZEPAM 2 MG/ML IJ SOLN
INTRAMUSCULAR | Status: AC
  Administered 2014-02-24: 1 mg
  Filled 2014-02-24: qty 1

## 2014-02-24 MED ORDER — LORAZEPAM 2 MG/ML IJ SOLN: 1.0000 mg | INTRAMUSCULAR | Status: DC | PRN

## 2014-02-24 MED ORDER — LACTATED RINGERS IV BOLUS (SEPSIS)
30.0000 mL/kg | Freq: Once | INTRAVENOUS | Status: AC
  Administered 2014-02-24: 1533 mL via INTRAVENOUS

## 2014-02-24 MED ORDER — NOREPINEPHRINE BITARTRATE 1 MG/ML IJ SOLN
2.0000 ug/min | INTRAVENOUS | Status: DC
  Administered 2014-02-24: 10 ug/min via INTRAVENOUS
  Administered 2014-02-24 (×2): 5 ug/min via INTRAVENOUS
  Filled 2014-02-24: qty 4

## 2014-02-24 MED ORDER — INSULIN ASPART 100 UNIT/ML ~~LOC~~ SOLN
2.0000 [IU] | SUBCUTANEOUS | Status: DC
  Administered 2014-02-24 – 2014-02-25 (×2): 4 [IU] via SUBCUTANEOUS

## 2014-02-24 MED ORDER — PHENYLEPHRINE HCL 10 MG/ML IJ SOLN
30.0000 ug/min | INTRAVENOUS | Status: DC
  Administered 2014-02-24: 30 ug/min via INTRAVENOUS
  Filled 2014-02-24 (×2): qty 1

## 2014-02-24 MED ORDER — PIPERACILLIN-TAZOBACTAM 3.375 G IVPB 30 MIN
3.3750 g | Freq: Four times a day (QID) | INTRAVENOUS | Status: DC
  Administered 2014-02-24 – 2014-02-25 (×3): 3.375 g via INTRAVENOUS
  Filled 2014-02-24 (×5): qty 50

## 2014-02-24 MED ORDER — CHLORHEXIDINE GLUCONATE 0.12 % MT SOLN
15.0000 mL | Freq: Two times a day (BID) | OROMUCOSAL | Status: DC
  Administered 2014-02-24 – 2014-02-27 (×7): 15 mL via OROMUCOSAL
  Filled 2014-02-24 (×7): qty 15

## 2014-02-24 MED ORDER — PHENYLEPHRINE HCL 10 MG/ML IJ SOLN
30.0000 ug/min | INTRAVENOUS | Status: DC
  Administered 2014-02-24: 60 ug/min via INTRAVENOUS
  Administered 2014-02-24 – 2014-02-25 (×2): 30 ug/min via INTRAVENOUS
  Filled 2014-02-24 (×4): qty 1

## 2014-02-24 NOTE — Consult Note (Signed)
Patient QQ:[id number] A Hemsley      DOB: 07/16/1935      EPP:[id number]  Summary of Consult; full note to follow:  Met with Patient's daughters [name], and Baker Janus, and son Milbert Coulter on the phone.  Milbert Coulter is the POA.  8 children total.  [name] has been the primary care giver.   Patient described as strong willed, independent . Worked for [employer] service  Patient had been doing fairly well until Christmas last. Since then she has developed worsening dementia symptoms with psychosis/ hallucinations, escaping the house.  She was admitted in February with UTI and discharged to SNF.  Last Saturday she was not quite herself in the am but by afternoon she wanted to sleep and was not eating.  Her daughter noted the change and requested that she be examined.  Patient had a recurrent UtI which we have come to find out is a different organism.  Patient has not been able to eat or take pills which is making her parkinson's worse.  Family reports she has always told her neurologist that she wanted to be resuscitated but this was before the significant cognitive changes. The children now understand the progression of her disease . They are going to talk first among themselves about her code status. They are leaning toward DNr.  It was very important for Mrs. Oliveri to get back to [place].  Her children are going to work with the medical team to decide on next steps with that desire in mind.  As of last Saturday she was able to sit in the car with no problem.  We will reassess needs in Next 24 hrs as she was just switched to Vancomycin which is the preferred sensitivity for the enterococcus.  The family and I talked about feeding her and get her able to take her Parkinson's pills. They don't really want to put a feeding tube but they understand what a pinch they are in .  For now, consider PiCC line for IV since not able to take po and long term abx may be needed ( may be hard if patient can't straighten arms).  If patient  gets alert enough to take meds , get sinemet started asap.  Family and I will talk again in the am.  Recommend:  1.  Full code, family considering change to DNR but no there yet.  2.  Inability to take PO.  Family wanted to wait and see if she would wake up further to take po. Reassess in am.  3.  Enterococcus UTI: continue Vancomycin  4.  Family now understands course of illness, complications and potential prognosis depending on complications. They will be talking with each other regarding short as well as long term choices   Total time 115- 215.  Lilyanah Celestin L. Lovena Le, MD MBA The Palliative Medicine Team at [facility] Phone: [phone] Pager: [phone]

## 2014-02-24 NOTE — ED Provider Notes (Signed)
CODE BLUE called on floor and I responded as ED physician. Per nursing report the patient was last seen normal at 1830. At 1905 they found her unresponsive and pulseless. She was not initially on a monitor. CPR was initiated. When she was placed on the monitor she was found to be in asystole. On my arrival CPR was being performed. Her initial IV was infiltrated. Bag valvewas initiated by anesthesia. Epinephrine was given x3 per protocol. And patient was intubated by myself see note. Patient then began having an organized rhythm on the monitor but continued pulseless. CPR continued and the patient regained pulses that were palpated. She then went into V. tach twice and had defibrillation performed with return of an organized rhythm with pulses. At this point care was turned over to hospitalist Dr. Lyda PeroneJared Gardner. INTUBATION Performed by: Hilario QuarryAY,Elia Nunley S  Required items: required blood products, implants, devices, and special equipment available Patient identity confirmed: provided demographic data and hospital-assigned identification number Time out: Immediately prior to procedure a "time out" was called to verify the correct patient, procedure, equipment, support staff and site/side marked as required.  Indications: cpr  Intubation method: Glidescope Laryngoscopy   Preoxygenation: BVM   Tube Size: 7.5 cuffed  Post-procedure assessment: chest rise and ETCO2 monitor Breath sounds: equal and absent over the epigastrium Tube secured with: ETT holder Chest x-Yama Nielson interpreted by radiologist and me.  Chest x-Riya Huxford findings: 7endotracheal tube in appropriate position  Patient tolerated the procedure well with no immediate complications.  After initial intubation balloon at cords and unable to advance tube, patient reintubated with 7.0 tube- good bilateral bs and bilateral chest rise.   cardiopulmonary resuscitation    Hilario Quarryanielle S Bart Ashford, MD 02/24/14 2235

## 2014-02-24 NOTE — Consult Note (Signed)
PULMONARY  / CRITICAL CARE MEDICINE  Name: Meagan [name] MRN: [id number] DOB: [birth year]    ADMISSION DATE:  02/21/2014 CONSULTATION DATE:  3/22  REFERRING MD :  Internal Medicine PRIMARY SERVICE: Internal Medicine  CHIEF COMPLAINT:  S/P Cardiac Arrest  BRIEF PATIENT DESCRIPTION: 79 yo female with parkinson's disease, severe dementia, hypertension who was found on the floor in asystole.  Underwent 18 minutes ACLS with ROSC following multiple rounds of epi, and once patient converted to Ventricular tachycardia, received amiodarone bolus followed by 2 [facility] with ROSC.  SIGNIFICANT EVENTS / STUDIES:  UTI with Enterococcus 3/22 Switched from Ceftriaxone to Vanc 3/22  LINES / TUBES: None ET Tube 3/22  CULTURES: No blood cultures Urine cultures : 3/18 Enterococcus Non-VRE  ANTIBIOTICS: Vancomcyin 3/22  HISTORY OF PRESENT ILLNESS:  79 yo female with parkinson's disease, severe dementia, hypertension who was found on the floor in asystole.  Underwent 18 minutes ACLS with ROSC following multiple rounds of epi, and once patient converted to Ventricular tachycardia, received amiodarone bolus followed by 2 [facility] with ROSC.  Transferred to MICU for further stabilization and CCM service consulted.  Discussed case with hospitalist, question of progressive sepsis versus aspiration as history with patient being unable to clear secretions due to severity of illness, progressive dementia, and severe parkinson's disease.  Patient with failure to thrive at home, limited ability to perform any ADL's.  On bedside exam, patient with significant shock on levophed gtt, not breathing over the vent, tongue fasciculations, and mid fixed pupils (no history of atropine administration).  GCS at this time is 3.  Discussed case with family, a this time they are requesting DRN, no escalation of care, no heroic measures, and shifting of goals to focus on comfort.  At this time they would like to continue current  care, as son will be arriving on Tuesday.  Family understands patient's grave prognosis and request no invasive procedures at this time.  POA (Son) is in agreement.    Initially admitted on 3/18 with fever, AMS, from SNF due to fevers, poor oral intake and UTI.  Stable hemodynamics at this time.  Persistent fevers during admission, continued poor po intake, but no changes in hemodynamics.  Palliative care has been involved, discussing goals of care with family, but previously family wanted to continue aggressive measures.     PAST MEDICAL HISTORY :  Past Medical History  Diagnosis Date  . Hypertension   . Tremor   . History of gallstones   . H/O urinary tract infection   . Urine incontinence   . Parkinson disease    Past Surgical History  Procedure Laterality Date  . Shoulder surgery    . Knee surgery    . Colonoscopy     Prior to Admission medications   Medication Sig Start Date End Date Taking? Authorizing Provider  carbidopa-levodopa (SINEMET CR) 50-200 MG per tablet Take 1 tablet by mouth at bedtime. 08/24/13  Yes Rebecca S Tat, DO  carbidopa-levodopa (SINEMET) 25-100 MG per tablet Take 1.5 tablets by mouth 3 (three) times daily. 08/24/13 08/24/14 Yes Rebecca S Tat, DO  carvedilol (COREG) 3.125 MG tablet Take 1 tablet (3.125 mg total) by mouth 2 (two) times daily with a meal. 01/31/14  Yes Shanker Levora Dredge, MD  ciprofloxacin (CIPRO) 250 MG tablet Take 250 mg by mouth 2 (two) times daily.   Yes Historical Provider, MD  lactose free nutrition (BOOST PLUS) LIQD Take 237 mLs by mouth 3 (three) times daily with meals.  01/31/14  Yes Shanker Levora Dredge, MD  mirtazapine (REMERON) 7.5 MG tablet Take 7.5 mg by mouth at bedtime.   Yes Historical Provider, MD  QUEtiapine (SEROQUEL) 25 MG tablet Take 12.5 mg by mouth at bedtime.    Yes Historical Provider, MD  saccharomyces boulardii (FLORASTOR) 250 MG capsule Take 250 mg by mouth 2 (two) times daily.   Yes Historical Provider, MD   No Known  Allergies  FAMILY HISTORY:  Family History  Problem Relation Age of Onset  . Diabetes Father   . Colon cancer Neg Hx   . Birth defects Neg Hx   . Heart disease Neg Hx   . Stroke Neg Hx    SOCIAL HISTORY:  reports that she has never smoked. She has never used smokeless tobacco. She reports that she does not drink alcohol or use illicit drugs.  REVIEW OF SYSTEMS:  Unable to complete due to condition  SUBJECTIVE:   VITAL SIGNS: Temp:  [98.5 F (36.9 C)-101.5 F (38.6 C)] 98.5 F (36.9 C) (03/22 1305) Pulse Rate:  [65-87] 65 (03/22 2000) Resp:  [14-24] 14 (03/22 2000) BP: (50-136)/(36-64) 50/36 mmHg (03/22 2000) SpO2:  [92 %-97 %] 97 % (03/22 2000) FiO2 (%):  [100 %] 100 % (03/22 2031) Weight:  [112 lb 9.6 oz (51.075 kg)] 112 lb 9.6 oz (51.075 kg) (03/22 0547) HEMODYNAMICS:   VENTILATOR SETTINGS: Vent Mode:  [-] PRVC FiO2 (%):  [100 %] 100 % Set Rate:  [14 bmp] 14 bmp Vt Set:  [400 mL-540 mL] 400 mL PEEP:  [8 cmH20] 8 cmH20 Plateau Pressure:  [16 cmH20] 16 cmH20 INTAKE / OUTPUT: Intake/Output     03/22 0701 - 03/23 0700   I.V. (mL/kg)    Total Intake(mL/kg)    Net 0       Urine Occurrence 2 x   Stool Occurrence      PHYSICAL EXAMINATION: General:  Unresponsive, no distress, in shock Neuro:  GCS 3, pupils fixed and non reactive, fasciculations in tongue  HEENT:  Noted severe JVD,  Cardiovascular:  Tachycardic, no MRG,  Lungs:  CLEAR without rhonchi, rales, or wheezes, unable to appreciate left lobe infiltrate  Abdomen:  Non tender, normal bowel sounds, noted abdominal scars Musculoskeletal:  No LE, no movement, thin and cachectic  Skin:  No breakdown, warm and dry  LABS:  Recent Labs Lab 03-01-2014 1447 2014/03/01 1456 02/21/14 0305 02/23/14 1243 02/24/14 2005 02/24/14 2020  HGB 12.2  --  11.5*  --   --  8.4*  WBC 9.0  --  10.5  --   --  9.3  PLT 242  --  238  --   --  179  NA 149*  --  150* 148*  --   --   K 4.0  --  3.6* 3.5*  --   --   CL 107  --   108 110  --   --   CO2 25  --  26 23  --   --   GLUCOSE 132*  --  129* 127*  --   --   BUN 47*  --  47* 22  --   --   CREATININE 0.61  --  0.56 0.42*  --   --   CALCIUM 9.9  --  9.4 8.7  --   --   AST  --   --  26  --   --   --   ALT  --   --  17  --   --   --  ALKPHOS  --   --  72  --   --   --   BILITOT  --   --  0.8  --   --   --   PROT  --   --  7.0  --   --   --   ALBUMIN  --   --  2.5*  --   --   --   LATICACIDVEN  --  1.37  --   --   --  12.0*  PHART  --   --   --   --  7.181*  --   PCO2ART  --   --   --   --  43.8  --   PO2ART  --   --   --   --  276.0*  --     Recent Labs Lab 02/21/14 0725 02/22/14 0727 02/23/14 0725 02/24/14 0842  GLUCAP 118* 118* 131* 127*    CXR: possible left lower lobe infiltrate, noted retrocardiac.  ET Tube in place  ASSESSMENT / PLAN:  PULMONARY A:  Respiratory failure Patient with history of inability to clear secretions, possible aspiration with multiple risk factors, developing left lower infiltrate. Hypoxia likely precipitated arrest.  Significant acidosis likely due to metabolic requirements, unable to protect airway. TV 550 at bedside exam  P: Increase PEEP to titrate FiO2 down to at least 60% Goal SpO2 >95% s/p arrest Peak airway pressures <30 PRVC Avoid dyssynchrony Sedation with fentanyl  Increased RR and decreased TV to 400 with increased MV to 9-10 due to severe acidosis with improvement of hemodynamics.  Follow up ABG. Pending electrolytes, correction as needed  CARDIOVASCULAR A:   Undifferentiated Shock s/p asystolic cardiac arrest Aspiration/hypoxic induced arrest, post cardiac arrest reperfusion syndrome, evolving sepsis Again goals of care as per family are to focus on comfort and not heroic measures  P:  Patient DNR, no escalation of care, no lines, no additional pressors, no compressions or heroic measures. Neo for MAP >70-75 post arrest Glycemic control with SSI 30 cc/kg infusion of balanced salt solution,  patient with JVD with likely long standing cardiac dysfunction.  No evidence of pnx, and once PEEP applied with MV resolution of hypoxia.  Pending electrolytes Zosyn/Vancomycin (coverage of known UTI and developing pneumonia HCAP/Aspiration) Grave prognosis, family understands the patient could pass despite efforts  A:  Vtach During ACLS, patient with Kirby FunkVtach, converted with DCC  P:   Monitor, support hemodynamics, avoid hypoxia Will not continue gtt at this time, again, likely hypoxic/hypoperfusion related.  Support those two issues, monitor for arrhythmia.  RENAL A:  Electrolytes  P:   Replace as needed Foley in place  GASTROINTESTINAL A:  Stress ulcer prophylaxis P:   On protonix  HEMATOLOGIC A:  DVT prophylaxis  P:  Heparin tid  INFECTIOUS A:  Severe Sepsis Patient with Enterococcus UTI Possible aspiration injury P:   See above  30cc/kg Ringers bolus now Continue broad spectrum antibiotics Ventilator support for oxygenation/metabolic requirements/acidosis/inability to protect airway Phenylephrine 60 mcg/min through a peripheral line Family requesting no aggressive interventions, defering arterial and central line placement Plan on comfort care measures in next 48 hours, awaiting son to arrive  ENDOCRINE A:  Glycemic Control   P:   Subcutaneous insuline  NEUROLOGIC A:  Anoxic brain injury Patient with tongue fasciculations, fixed pupils Baseline line dementia, unable to perform ADLs P:   Again, patient's family understands grave prognosis and wishes, on arrival of son, to initiate comfort measures. Would not  benifit from therapeutic hypothermia due to baseline status and presenting rhythm being asystole. Keppra IV Ativan PRN  I have personally obtained a history, examined the patient, evaluated laboratory and imaging results, formulated the assessment and plan and placed orders. CRITICAL CARE: The patient is critically ill with multiple organ systems failure  and requires high complexity decision making for assessment and support, frequent evaluation and titration of therapies, application of advanced monitoring technologies and extensive interpretation of multiple databases. Critical Care Time devoted to patient care services described in this note is 50 minutes.    Pulmonary and Critical Care Medicine [facility] Pager: [phone]  02/24/2014, 8:59 PM

## 2014-02-24 NOTE — Progress Notes (Signed)
Rounded at 1830. Patient stable at that time. Repositioned patient and suctioned her mouth. At 1908 during shift change nurse techs entered room to find patient unresponsive. Cindy RN entered room after them and  began chest compressions and we called Code Blue. Rapid response team arrived after five rounds of compressions had been done. From there I exited room to contact Health Care Power of Attorney and talk to family member who was present.

## 2014-02-24 NOTE — Progress Notes (Signed)
Chart reviewed.  Noted that Pt's family still not sure about d/c plan and want to process meeting today with Dr. Ladona Ridgelaylor.  Dr. Lubertha Basqueaylor's note stated that she will meet with family again tomorrow to solidify d/c plans.  Weekday CSW to follow.  Providence CrosbyAmanda Taino Maertens, LCSWA Clinical Social Work [phone]

## 2014-02-24 NOTE — Significant Event (Signed)
This RN went to room 1306 at request of Ciro BackerKim Mansfield NT to assess patient, pt was found to be unresponsive, without heart beat or respirations, CPR was started by this RN and a Code Blue was called. Crash cart was brought to room and ventilation was started by Lyondell Chemicalialta RN, defibrillator pads were placed on patient and her rhythm was noted. Code team arrived and took over care of patient. This RN turned over CPR to [employer] CorporationKim NT and started recording on Code sheet. Once patient was stable enough to transfer she was taken to the ICU for further care.

## 2014-02-24 NOTE — Progress Notes (Signed)
ANTIBIOTIC CONSULT NOTE - INITIAL  Pharmacy Consult for Vancomycin  Indication: UTI, enterococcus  No Known Allergies  Patient Measurements: Height: 5\' 10"  (177.8 cm) Weight: 112 lb 9.6 oz (51.075 kg) IBW/kg (Calculated) : 68.5  Vital Signs: Temp: 101.5 F (38.6 C) (03/22 0547) Temp src: Axillary (03/22 0547) BP: 136/61 mmHg (03/22 0547) Pulse Rate: 87 (03/22 0547) Intake/Output from previous day: 03/21 0701 - 03/22 0700 In: 1121.3 [I.V.:1121.3] Out: -  Intake/Output from this shift:    Labs:  Recent Labs  02/23/14 1243  CREATININE 0.42*   Estimated Creatinine Clearance: 48.3 ml/min (by C-G formula based on Cr of 0.42). No results found for this basename: Rolm GalaVANCOTROUGH, VANCOPEAK, VANCORANDOM, GENTTROUGH, GENTPEAK, GENTRANDOM, TOBRATROUGH, TOBRAPEAK, TOBRARND, AMIKACINPEAK, AMIKACINTROU, AMIKACIN,  in the last 72 hours   Microbiology: Recent Results (from the past 720 hour(s))  URINE CULTURE     Status: None   Collection Time    01/27/14  3:40 PM      Result Value Ref Range Status   Specimen Description URINE, CATHETERIZED   Final   Special Requests NONE   Final   Culture  Setup Time     Final   Value: 01/27/2014 19:44     Performed at [facility]   Colony Count     Final   Value: >=100,000 COLONIES/ML     Performed at [facility]   Culture     Final   Value: ESCHERICHIA COLI     Performed at [facility]   Report Status 01/29/2014 FINAL   Final   Organism ID, Bacteria ESCHERICHIA COLI   Final  URINE CULTURE     Status: None   Collection Time    02/25/2014  3:46 PM      Result Value Ref Range Status   Specimen Description URINE, CATHETERIZED   Final   Special Requests NONE   Final   Culture  Setup Time     Final   Value: 02/19/2014 21:57     Performed at [facility]   Colony Count     Final   Value: 30,000 COLONIES/ML     Performed at [facility]   Culture     Final   Value: ENTEROCOCCUS SPECIES     Performed  at [facility]   Report Status 02/23/2014 FINAL   Final   Organism ID, Bacteria ENTEROCOCCUS SPECIES   Final  MRSA PCR SCREENING     Status: None   Collection Time    02/21/2014  5:52 PM      Result Value Ref Range Status   MRSA by PCR NEGATIVE  NEGATIVE Final   Comment:            The GeneXpert MRSA Assay (FDA     approved for NASAL specimens     only), is one component of a     comprehensive MRSA colonization     surveillance program. It is not     intended to diagnose MRSA     infection nor to guide or     monitor treatment for     MRSA infections.    Medical History: Past Medical History  Diagnosis Date  . Hypertension   . Tremor   . History of gallstones   . H/O urinary tract infection   . Urine incontinence   . Parkinson disease    Medications:  Scheduled:  . carbidopa-levodopa  1 tablet Oral QHS  . carbidopa-levodopa  1.5 tablet  Oral TID WC  . carvedilol  3.125 mg Oral BID WC  . lactose free nutrition  237 mL Oral TID WC  . mirtazapine  7.5 mg Oral QHS  . QUEtiapine  12.5 mg Oral QHS  . saccharomyces boulardii  250 mg Oral BID  . vancomycin  1,000 mg Intravenous Q24H   Anti-infectives   Start     Dose/Rate Route Frequency Ordered Stop   02/24/14 1400  vancomycin (VANCOCIN) IVPB 1000 mg/200 mL premix     1,000 mg 200 mL/hr over 60 Minutes Intravenous Every 24 hours 02/24/14 1248     03-06-14 1715  cefTRIAXone (ROCEPHIN) 1 g in dextrose 5 % 50 mL IVPB  Status:  Discontinued     1 g 100 mL/hr over 30 Minutes Intravenous Every 24 hours 03-06-14 1706 02/24/14 1241   03/06/2014 1545  ciprofloxacin (CIPRO) IVPB 400 mg     400 mg 200 mL/hr over 60 Minutes Intravenous  Once 06-Mar-2014 1541 03/06/14 1746     Assessment: 60 yoF SNF resident, to hospital with probable UTI, fevers. Completed 4 days Rocephin, culture finalized-Enterococcus which is Ampicillin resistant, Vancomycin sensitive. Begin Vancomycin per pharmacy.  Still febrile, temp 101.5 this am. WBC  wnl 3/19 (10.5K)  Goal of Therapy:  Vancomycin trough level 10-15 mcg/ml  Plan:   Vancomycin 1gm q24  Otho Bellows PharmD Pager [phone] 02/24/2014, 1:28 PM

## 2014-02-24 NOTE — Progress Notes (Signed)
[facility] PROGRESS NOTE  Meagan [name] VWU:[id number] DOB: [birth year] DOA: 02/27/2014 PCP: Nicki Reaper, NP  Brief narrative: 79 y.o. female, with history of Parkinson's disease, dementia, hypertension, recent admission in 01/2014 for altered mental status and UTI who now presented from SNF to [facility] ED [facility]/17/2014 due to poor oral intake, low grade fevers and questionable UTI. She does have history of recurrent UTI's. No reports of cough, vomiting. No respiratory distress.  In ED, BP was 126/64, HR 86, Tmax 100.9 F and oxygen saturation 91% on room air. Blood work revealed sodium of 149 otherwise unremarkable. CXr did not reveal acute cardiopulmonary findings. CT head did not show acute intracranial findings. UA showed many bacteria and too numerous to count WBC. She was started on cipro in ED. But this was changed to rocephin by [facility] as pt was previously on cipro and has developed UTI. In light of new urine culture report growing enterococcus we will switch to vanco starting 3/22.  Assessment and Plan:   Principal Problem:  UTI (lower urinary tract infection)  - secondary to enterococcus species; stop IV rocephin today 3/22 and start vanco  Active Problems:  Hypernatremia  - secondary to dehydration  - Sodium trending up so we changed IV fluids to free water; sodium 148 on 3/21 Thick mucus in mouth  - suction/remove what is possible; SLP evaluation not done as pt not alert enough to participate  - awaiting PCT to meet with the family for goals of care  Parkinson disease  - continue sinemet per home regimen if at all possible  - PT evaluation once pt able to participate  - appreciate PCT for GOC  HTN (hypertension)  - continue coreg 3.125 mg PO BID if pt alert enough to try to swallow Severe protein calorie malnutrition  - secondary to progressive dementia   Code status: full code  Family education: updated daughter at bedside  Disposition: remains inpatient   Consultants:   Palliative care Non- medical consultants:  PT  SLP  Procedures:  None  Antibiotics:  Rocephin 03/01/2014 --> 02/24/2014 Vanco 02/24/2014 -->  Manson Passey, MD  [facility] Pager [phone]  If 7PM-7AM, please contact night-coverage [email or web address] Password [facility] 02/24/2014, 7:19 AM   LOS: 4 days   HPI/Subjective: No acute overnight events.   Objective: Filed Vitals:   02/23/14 0535 02/23/14 1350 02/23/14 2103 02/24/14 0547  BP: 129/63 126/64 114/54 136/61  Pulse: 92 87 84 87  Temp: 102 F (38.9 C) 99.7 F (37.6 C) 101.4 F (38.6 C) 101.5 F (38.6 C)  TempSrc: Axillary Axillary Axillary Axillary  Resp: 22 22 22 24   Height:      Weight:    51.075 kg (112 lb 9.6 oz)  SpO2: 92% 92% 92% 93%    Intake/Output Summary (Last 24 hours) at 02/24/14 0719 Last data filed at 02/24/14 0865  Gross per 24 hour  Intake 1121.25 ml  Output      0 ml  Net 1121.25 ml    Exam:   General:  Pt is sleeping this am, no significant mental status changes  Cardiovascular: Regular rate and rhythm, S1/S2 appreciated  Respiratory: some coarse breath sounds  Abdomen: Soft, non tender, non distended, bowel sounds present  Extremities: No edema, pulses DP and PT palpable bilaterally  Neuro: Grossly nonfocal  Data Reviewed: Basic Metabolic Panel:  Recent Labs Lab 03/03/2014 1447 02/21/14 0305 02/23/14 1243  NA 149* 150* 148*  K 4.0 3.6* 3.5*  CL 107 108 110  CO2  25 26 23   GLUCOSE 132* 129* 127*  BUN 47* 47* 22  CREATININE 0.61 0.56 0.42*  CALCIUM 9.9 9.4 8.7   Liver Function Tests:  Recent Labs Lab 02/21/14 0305  AST 26  ALT 17  ALKPHOS 72  BILITOT 0.8  PROT 7.0  ALBUMIN 2.5*   No results found for this basename: LIPASE, AMYLASE,  in the last 168 hours No results found for this basename: AMMONIA,  in the last 168 hours CBC:  Recent Labs Lab 02/07/2014 1447 02/21/14 0305  WBC 9.0 10.5  NEUTROABS 7.1  --   HGB 12.2 11.5*  HCT 37.1 36.6  MCV 92.8 95.6   PLT 242 238   Cardiac Enzymes: No results found for this basename: CKTOTAL, CKMB, CKMBINDEX, TROPONINI,  in the last 168 hours BNP: No components found with this basename: POCBNP,  CBG:  Recent Labs Lab 02/21/14 0725 02/22/14 0727 02/23/14 0725  GLUCAP 118* 118* 131*    Recent Results (from the past 240 hour(s))  URINE CULTURE     Status: None   Collection Time    03/03/2014  3:46 PM      Result Value Ref Range Status   Specimen Description URINE, CATHETERIZED   Final   Special Requests NONE   Final   Culture  Setup Time     Final   Value: 02/23/2014 21:57     Performed at [facility]   Colony Count     Final   Value: 30,000 COLONIES/ML     Performed at [facility]   Culture     Final   Value: ENTEROCOCCUS SPECIES     Performed at [facility]   Report Status 02/23/2014 FINAL   Final   Organism ID, Bacteria ENTEROCOCCUS SPECIES   Final  MRSA PCR SCREENING     Status: None   Collection Time    02/12/2014  5:52 PM      Result Value Ref Range Status   MRSA by PCR NEGATIVE  NEGATIVE Final   Comment:            The GeneXpert MRSA Assay (FDA     approved for NASAL specimens     only), is one component of a     comprehensive MRSA colonization     surveillance program. It is not     intended to diagnose MRSA     infection nor to guide or     monitor treatment for     MRSA infections.     Studies: No results found.  Scheduled Meds: . carbidopa-levodopa  1 tablet Oral QHS  . carbidopa-levodopa  1.5 tablet Oral TID WC  . carvedilol  3.125 mg Oral BID WC  . cefTRIAXone (ROCEPHIN)  IV  1 g Intravenous Q24H  . lactose free nutrition  237 mL Oral TID WC  . mirtazapine  7.5 mg Oral QHS  . QUEtiapine  12.5 mg Oral QHS  . saccharomyces boulardii  250 mg Oral BID   Continuous Infusions: . dextrose 75 mL/hr at 02/24/14 [phone]

## 2014-02-24 NOTE — Significant Event (Addendum)
Code blue paged overhead, went to patients bed side, ACLS was in progress being run by Dr. Rosalia Hammersay.  Per nursing report to me: patient last seen responsive at ~1830, found unresponsive at ~1906, ROSC achieved at ~1926 (see code report for details).  During code time, 1 family member who was not HPOA present in hallway, POA did not wish to stop resuscitative efforts when contacted initially.  I ordered patient transferred to ICU, PCCM consulted emergently.  Patient started on pressors due to hypotension, vent support started due to ongoing dropping of O2 sats.  PCCM arrived, evaluated patient with me, and we proceeded to have discussion with family.  Patient believed to be in status myoclonus at this point on neurologic exam indicating severe anoxic brain injury.  POA has come to decision: patient is now DNR, no further escalation of care.  Intent is to perform vent liberation after he can be at bedside hopefully Tuesday if patient survives to that time.  Will therefore continue current treatments with no intent to escalate further.  Anticipate vent liberation and then likely in hospital death.

## 2014-02-24 NOTE — Progress Notes (Signed)
Chaplain paged at [phone] alerting him of Ms Swinford's CODE BLUE. Shortly after the chaplain paged by another unit again alerting him of the need for spiritual care presence.  Chaplain sat with daughters while they waited news of their mothers condition and was with them when doctors and nurses advised them of her declining condition. Pre-grief counsel and support was given.   Physicians have advised daughter and son, the health care power of attorney, that Ms Luanne BrasMcCulley is dead but machines are giving the impression of life. Daughters have accepted this and through their grief have been coping with the reality of this pronouncement. The son is on his way from [place] and should arrive in [place] sometime about 1700 to 1900 tomorrow (25 February 2014). The family understands that should Ms Luanne BrasMcCulley cease all outward signs of life, she will be kept in the morgue until the family can gather and arrange transport back to [place] to be buried.  Ms Luanne BrasMcCulley came to [place] with her significant other leaving her grown children in [place]. She will be buried in her native [place] near members of her family.  Family has voice through their grief their appreciation for the care the staff has show during this tragic event.  Should Ms Luanne BrasMcCulley still be present, request daytime chaplains check on family to continue spiritual care and support.  Benjie Karvonenharles D. Reigan Tolliver, DMin Chaplain

## 2014-02-25 ENCOUNTER — Ambulatory Visit: Payer: Medicare Other | Admitting: Neurology

## 2014-02-25 DIAGNOSIS — Z515 Encounter for palliative care: Secondary | ICD-10-CM

## 2014-02-25 DIAGNOSIS — I469 Cardiac arrest, cause unspecified: Secondary | ICD-10-CM

## 2014-02-25 DIAGNOSIS — Z66 Do not resuscitate: Secondary | ICD-10-CM

## 2014-02-25 DIAGNOSIS — J9601 Acute respiratory failure with hypoxia: Secondary | ICD-10-CM

## 2014-02-25 DIAGNOSIS — R579 Shock, unspecified: Secondary | ICD-10-CM

## 2014-02-25 LAB — BLOOD GAS, ARTERIAL
Acid-Base Excess: 0.6 mmol/L (ref 0.0–2.0)
BICARBONATE: 22.5 meq/L (ref 20.0–24.0)
Drawn by: 11249
FIO2: 0.7 %
MECHVT: 400 mL
O2 Saturation: 99.4 %
PATIENT TEMPERATURE: 98.6
PEEP: 8 cmH2O
RATE: 24 resp/min
TCO2: 20.4 mmol/L (ref 0–100)
pCO2 arterial: 27.8 mmHg — ABNORMAL LOW (ref 35.0–45.0)
pH, Arterial: 7.52 — ABNORMAL HIGH (ref 7.350–7.450)
pO2, Arterial: 311 mmHg — ABNORMAL HIGH (ref 80.0–100.0)

## 2014-02-25 LAB — GLUCOSE, CAPILLARY
GLUCOSE-CAPILLARY: 117 mg/dL — AB (ref 70–99)
GLUCOSE-CAPILLARY: 108 mg/dL — AB (ref 70–99)
GLUCOSE-CAPILLARY: 100 mg/dL — AB (ref 70–99)
Glucose-Capillary: 156 mg/dL — ABNORMAL HIGH (ref 70–99)
Glucose-Capillary: 104 mg/dL — ABNORMAL HIGH (ref 70–99)
Glucose-Capillary: 91 mg/dL (ref 70–99)

## 2014-02-25 LAB — MRSA PCR SCREENING: MRSA by PCR: NEGATIVE

## 2014-02-25 LAB — LACTIC ACID, PLASMA: Lactic Acid, Venous: 7.4 mmol/L — ABNORMAL HIGH (ref 0.5–2.2)

## 2014-02-25 MED ORDER — HEPARIN SODIUM (PORCINE) 5000 UNIT/ML IJ SOLN
5000.0000 [IU] | Freq: Three times a day (TID) | INTRAMUSCULAR | Status: DC
  Administered 2014-02-25 (×3): 5000 [IU] via SUBCUTANEOUS
  Filled 2014-02-25 (×7): qty 1

## 2014-02-25 MED ORDER — PIPERACILLIN-TAZOBACTAM 3.375 G IVPB
3.3750 g | Freq: Three times a day (TID) | INTRAVENOUS | Status: DC
  Administered 2014-02-25: 3.375 g via INTRAVENOUS
  Filled 2014-02-25 (×4): qty 50

## 2014-02-25 MED FILL — Sodium Bicarbonate IV Soln 8.4%: INTRAVENOUS | Qty: 50 | Status: AC

## 2014-02-25 MED FILL — Medication: Qty: 1 | Status: AC

## 2014-02-25 NOTE — Progress Notes (Signed)
SLP Discharge Note  Patient Details Name: Meagan [name] MRN: [id number] DOB: [birth year]   Cancelled treatment:       Reason Eval/Treat Not Completed: Medical issues which prohibited therapy (pt now on vent - ? mucus plug - possible plans for comfort care, SLP to sign off, please reorder if desire)   Mills KollerKimball, Deshannon Seide Ann Varun Jourdan, MS [facility] S[facility] [phone]

## 2014-02-25 NOTE — Progress Notes (Signed)
Patient AV:[id number]:Arthelia A Derden      DOB: 06/20/1935      NWG:[id number]  Events of last evening noted. No family at the bedside.  Report is son/ POA Leon on his way from [place].  I will return later today to assist with Goals and emotional support.  Jandy Brackens L. Ladona Ridgelaylor, MD MBA The Palliative Medicine Team at [facility] Team Phone: [phone] Pager: [phone]

## 2014-02-25 NOTE — Progress Notes (Signed)
Patient BJ:[id number]:Rainah A Marrazzo      DOB: [birth year]      HYQ:[id number]  Arrived after informing family that I was on the way.  They left before I arrived.  Called [name] she related they had to leave and that her brother the POA for the patient would be arriving in the am.  Offered to talk them through next steps.  Treon Kehl L. Ladona Ridgelaylor, MD MBA The Palliative Medicine Team at [facility] Team Phone: [phone] Pager: [phone]

## 2014-02-25 NOTE — Progress Notes (Signed)
NUTRITION FOLLOW UP  Intervention:   - Nutrition per family wishes, please consult if TF desired - Will continue to monitor   Nutrition Dx:   Inadequate oral intake related to swallowing difficulties as evidenced by poor PO intake - ongoing now related to inability to eat as evidenced by mechanical ventilation/NPO.    Goal:   Pt to meet >/= 90% of their estimated nutrition needs - not met, on ventilator, no plans for TF   Monitor:   Weights, labs, vent status, comfort feeds versus TF  Assessment:   79 y.o. female, with history of Parkinson's disease, dementia, hypertension, recent admission in 01/2014 for altered mental status and UTI who now presented from SNF [facility] ED 02/09/2014 due to poor oral intake, low grade fevers and questionable UTI.   3/19 - Unable to obtain nutrition hx from pt due pt's current state of unresponsiveness. Daughter was not present during time of visit. Per Nurse tech, daughter reported pt has not had anything to eat or drink since 3/14. Daughter reports pt has swallowing difficulty. Meal completion 0%. Pt is contracted. Pt with significant fat and muscle mass loss. RD recommended continuing with Boost Plus TID and monitoring for refeeding risk r/t reported minimal PO intake.   3/23 - Had SLP bedside swallow evaluation 3/20 with recommendations for NPO. Palliative care has been following. Code blue called 3/22 with CPR initiated and pt intubated. Per family discussion, goals are to focus on comfort. Spiritual care following. Per MD notes, plan is to await family to arrive for terminal wean.   Patient is currently intubated on ventilator support.  MV: 9.9 L/min Temp (24hrs), Avg:100.2 F (37.9 C), Min:99.5 F (37.5 C), Max:100.8 F (38.2 C)  Propofol: off  Sodium elevated Potassium WNL Magnesium and phosphorus not ordered Alk phos, AST/ALT elevated    Height: Ht Readings from Last 1 Encounters:  02/24/14 $RemoveB'5\' 10"'llaWoTxG$  (1.778 m)    Weight Status:   Wt  Readings from Last 1 Encounters:  02/25/14 125 lb 7.1 oz (56.9 kg)  Admit wt:        112 lb 7 oz (51 kg)  Net I/Os: +4.2L  Re-estimated needs:  Kcal: 1517 Protein: 70-80g Fluid: >1.5L/day  Skin: Intact   Diet Order: NPO   Intake/Output Summary (Last 24 hours) at 02/25/14 1553 Last data filed at 02/25/14 1500  Gross per 24 hour  Intake 3159.6 ml  Output     62 ml  Net 3097.6 ml    Last BM: 3/23    Labs:   Recent Labs Lab 02/21/14 0305 02/23/14 1243 02/24/14 2020  NA 150* 148* 150*  K 3.6* 3.5* 3.8  CL 108 110 109  CO2 26 23 15*  BUN 47* 22 22  CREATININE 0.56 0.42* 0.62  CALCIUM 9.4 8.7 8.0*  GLUCOSE 129* 127* 230*    CBG (last 3)   Recent Labs  02/25/14 0508 02/25/14 0739 02/25/14 1206  GLUCAP 117* 108* 100*    Scheduled Meds: . antiseptic oral rinse  15 mL Mouth Rinse QID  . carbidopa-levodopa  1 tablet Oral QHS  . carbidopa-levodopa  1.5 tablet Oral TID WC  . carvedilol  3.125 mg Oral BID WC  . chlorhexidine  15 mL Mouth Rinse BID  . heparin subcutaneous  5,000 Units Subcutaneous 3 times per day  . insulin aspart  2-6 Units Subcutaneous 6 times per day  . lactose free nutrition  237 mL Oral TID WC  . levETIRAcetam  1,000 mg Intravenous  Q12H  . mirtazapine  7.5 mg Oral QHS  . pantoprazole (PROTONIX) IV  40 mg Intravenous QHS  . piperacillin-tazobactam (ZOSYN)  IV  3.375 g Intravenous 3 times per day  . QUEtiapine  12.5 mg Oral QHS  . saccharomyces boulardii  250 mg Oral BID  . vancomycin  1,000 mg Intravenous Q24H    Continuous Infusions: . dextrose 75 mL/hr at 02/24/14 0625  . fentaNYL infusion INTRAVENOUS 25 mcg/hr (02/24/14 2159)  . norepinephrine (LEVOPHED) Adult infusion Stopped (02/24/14 2300)  . phenylephrine (NEO-SYNEPHRINE) Adult infusion 10 mcg/min (02/25/14 1145)    Mikey College MS, RD, LDN [phone] Pager [phone] After Hours Pager

## 2014-02-25 NOTE — Progress Notes (Addendum)
PULMONARY  / CRITICAL CARE MEDICINE  Name: Meagan [name] MRN: [id number] DOB: 1935/12/06    ADMISSION DATE:  03/03/2014 CONSULTATION DATE:  3/22  REFERRING MD :  Internal Medicine PRIMARY SERVICE: Internal Medicine  CHIEF COMPLAINT:  S/P Cardiac Arrest  BRIEF PATIENT DESCRIPTION: 79 yo female with parkinson's disease, severe dementia, hypertension who was found on the floor in asystole.  Underwent 18 minutes ACLS with ROSC following multiple rounds of epi, and once patient converted to Ventricular tachycardia, received amiodarone bolus [facility].  SIGNIFICANT EVENTS / STUDIES:  UTI with Enterococcus 3/22 Switched from Ceftriaxone to Vanc 3/22 Cardiac arrest 3/22 LINES / TUBES:  ET Tube 3/22  CULTURES: No blood cultures Urine cultures : 3/18 Enterococcus Non-VRE  ANTIBIOTICS: Vancomcyin 3/22>> zoysn 3/22>>  HISTORY OF PRESENT ILLNESS:  79 yo female with parkinson's disease, severe dementia, hypertension who was found on the floor in asystole.  Underwent 18 minutes ACLS with ROSC following multiple rounds of epi, and once patient converted to Ventricular tachycardia, received amiodarone bolus [facility].  Transferred to ICU for further stabilization and CCM service consulted.  Discussed case with hospitalist, question of progressive sepsis versus aspiration as history with patient being unable to clear secretions due to severity of illness, progressive dementia, and severe parkinson's disease.  Patient with failure to thrive at home, limited ability to perform any ADL's.  On bedside exam, patient with significant shock on levophed gtt, not breathing over the vent, tongue fasciculations, and mid fixed pupils (no history of atropine administration).  GCS at this time is 3.  Discussed case with family, a this time they are requesting DRN, no escalation of care, no heroic measures, and shifting of goals to focus on comfort.  At this time they would  like to continue current care, as son will be arriving on Tuesday.  Family understands patient's grave prognosis and request no invasive procedures at this time.  POA (Son) is in agreement.    Initially admitted on 3/18 with fever, AMS, from SNF due to fevers, poor oral intake and UTI.  Stable hemodynamics at this time.  Persistent fevers during admission, continued poor po intake, but no changes in hemodynamics.  Palliative care has been involved, discussing goals of care with family, but previously family wanted to continue aggressive measures.    SUBJECTIVE:  No escalation of care 3/23  resp describing thick golf ball sized mucus during intubation proceduer. Therefore suspect aphyxiation from mucus VITAL SIGNS: Temp:  [98.5 F (36.9 C)-100.8 F (38.2 C)] 99.9 F (37.7 C) (03/23 1100) Pulse Rate:  [56-93] 56 (03/23 1100) Resp:  [13-30] 26 (03/23 1100) BP: (30-194)/(0-85) 88/42 mmHg (03/23 1100) SpO2:  [94 %-100 %] 100 % (03/23 1100) FiO2 (%):  [50 %-100 %] 50 % (03/23 0800) Weight:  [56.9 kg (125 lb 7.1 oz)-61.4 kg (135 lb 5.8 oz)] 56.9 kg (125 lb 7.1 oz) (03/23 0500) HEMODYNAMICS:   VENTILATOR SETTINGS: Vent Mode:  [-] PRVC FiO2 (%):  [50 %-100 %] 50 % Set Rate:  [14 bmp-24 bmp] 16 bmp Vt Set:  [400 mL-540 mL] 400 mL PEEP:  [5 cmH20-8 cmH20] 5 cmH20 Plateau Pressure:  [16 cmH20-27 cmH20] 17 cmH20 INTAKE / OUTPUT: Intake/Output     03/22 0701 - 03/23 0700 03/23 0701 - 03/24 0700   I.V. (mL/kg) 873.6 (15.4) 17.5 (0.3)   IV Piggyback 1743 100   Total Intake(mL/kg) 2616.6 (46) 117.5 (2.1)   Urine (mL/kg/hr) 35 (0)  Total Output 35 0   Net +2581.6 +117.5        Urine Occurrence 2 x    Stool Occurrence 2 x      PHYSICAL EXAMINATION: General:  Unresponsive, no distress, in shock Neuro:  GCS 3, pupils fixed and non reactive, fasciculations in tongue  HEENT:  Noted severe JVD,  Cardiovascular:  Tachycardic, no MRG,  Lungs:  CLEAR without rhonchi, rales, or wheezes, unable  to appreciate left lobe infiltrate  Abdomen:  Non tender, normal bowel sounds, noted abdominal scars Musculoskeletal:  No LE, no movement, thin and cachectic  Skin:  No breakdown, warm and dry  LABS:  PULMONARY  Recent Labs Lab 02/24/14 2005 02/24/14 2118 02/25/14 0200  PHART 7.181* 7.354 7.520*  PCO2ART 43.8 32.4* 27.8*  PO2ART 276.0* 491.0* 311.0*  HCO3 15.7* 17.6* 22.5  TCO2 15.6 16.4 20.4  O2SAT 98.7 99.4 99.4    CBC  Recent Labs Lab 02/21/2014 1447 02/21/14 0305 02/24/14 2020  HGB 12.2 11.5* 8.4*  HCT 37.1 36.6 27.0*  WBC 9.0 10.5 9.3  PLT 242 238 179    COAGULATION No results found for this basename: INR,  in the last 168 hours  CARDIAC   Recent Labs Lab 02/24/14 2020  TROPONINI <0.30   No results found for this basename: PROBNP,  in the last 168 hours   CHEMISTRY  Recent Labs Lab 02/12/2014 1447 02/21/14 0305 02/23/14 1243 02/24/14 2020  NA 149* 150* 148* 150*  K 4.0 3.6* 3.5* 3.8  CL 107 108 110 109  CO2 25 26 23  15*  GLUCOSE 132* 129* 127* 230*  BUN 47* 47* 22 22  CREATININE 0.61 0.56 0.42* 0.62  CALCIUM 9.9 9.4 8.7 8.0*   Estimated Creatinine Clearance: 53.7 ml/min (by C-G formula based on Cr of 0.62).   LIVER  Recent Labs Lab 02/21/14 0305 02/24/14 2020  AST 26 205*  ALT 17 100*  ALKPHOS 72 139*  BILITOT 0.8 0.8  PROT 7.0 4.9*  ALBUMIN 2.5* 1.3*     INFECTIOUS  Recent Labs Lab 02/10/2014 1456 02/24/14 2020 02/25/14 0335  LATICACIDVEN 1.37 12.0* 7.4*     ENDOCRINE CBG (last 3)   Recent Labs  02/25/14 0136 02/25/14 0508 02/25/14 0739  GLUCAP 156* 117* 108*         IMAGING x48h  Dg Chest Port 1 View  02/24/2014   CLINICAL DATA:  Acute respiratory failure. Intubation. Urinary tract infection. Parkinson's disease.  EXAM: PORTABLE CHEST - 1 VIEW  COMPARISON:  03/03/2014  FINDINGS: Endotracheal tube is seen in appropriate position with tip approximately 3 cm above the carina.  External objects are seen  overlying the left lower hemi thorax, however asymmetric opacity is noted in the left lower lung, suspicious for left lower lobe infiltrate. Right lung is clear. No evidence of pleural effusion. Heart size is within normal limits. Ectasia of the thoracic aorta remains stable.  IMPRESSION: Endotracheal tube in appropriate position.  Suspect left lower lobe infiltrate. Pneumonia cannot be excluded. Radiographic followup is recommended.   Electronically Signed   By: Myles Rosenthal M.D.   On: 02/24/2014 20:49       ASSESSMENT / PLAN:  PULMONARY A:  Respiratory failure Patient with history of inability to clear secretions, possible aspiration with multiple risk factors, developing left lower infiltrate. Hypoxia likely precipitated arrest.  Significant acidosis likely due to metabolic requirements, unable to protect airway.   P: Vent bundle  CARDIOVASCULAR A:   Undifferentiated Shock s/p asystolic cardiac arrest  Aspiration/hypoxic induced arrest, post cardiac arrest reperfusion syndrome, evolving sepsis Again goals of care as per family are to focus on comfort and not heroic measures  P:  Patient DNR, no escalation of care, no lines, no additional pressors, no compressions or heroic measures. Neo for MAP >70-75 post arrest Glycemic control with SSI 30 cc/kg infusion of balanced salt solution, patient with JVD with likely long standing cardiac dysfunction.  No evidence of pnx, and once PEEP applied with MV resolution of hypoxia.  Pending electrolytes Zosyn/Vancomycin (coverage of known UTI and developing pneumonia HCAP/Aspiration) Grave prognosis, family understands the patient could pass despite efforts  A:  Vtach During ACLS, patient with Kirby Funk, converted with DCC  P:   Monitor, support hemodynamics, avoid hypoxia Will not continue gtt at this time, again, likely hypoxic/hypoperfusion related.  Support those two issues, monitor for arrhythmia.  RENAL A:  Electrolytes  P:   Replace as  needed Foley in place  GASTROINTESTINAL A:  Stress ulcer prophylaxis P:   On protonix  HEMATOLOGIC A:  DVT prophylaxis  P:  Heparin tid  INFECTIOUS A:  Severe Sepsis Patient with Enterococcus UTI Possible aspiration injury P:   See above  Continue broad spectrum antibiotics Ventilator support for oxygenation/metabolic requirements/acidosis/inability to protect airway Phenylephrine 60 mcg/min through a peripheral line Family requesting no aggressive interventions, defering arterial and central line placement Plan on comfort care measures in next 48 hours, awaiting son to arrive  ENDOCRINE A:  Glycemic Control   P:   Subcutaneous insuline  NEUROLOGIC A:  Anoxic brain injury Patient with tongue fasciculations, fixed pupils Baseline line dementia, unable to perform ADLs P:   Again, patient's family understands grave prognosis and wishes, on arrival of son, to initiate comfort measures. Would not benifit from therapeutic hypothermia due to baseline status and presenting rhythm being asystole. Keppra IV Ativan PRN  Brett Canales Minor ACNP [facility] PCCM Pager [phone] till 3 pm If no answer page [phone] 02/25/2014, 11:49 AM   STAFF NOTE: I, Dr Lavinia Sharps have personally reviewed patient's available data, including medical history, events of note, physical examination and test results as part of my evaluation. I have discussed with resident/NP and other care providers such as pharmacist, RN and RRT.  In addition,  I personally evaluated patient and elicited key findings of acute reso failure, cardiac arrest likely due to mucus related asphyxiation. Asystole. Await family to arrive for terminal wean.   Rest per NP/medical resident whose note is outlined above and that I agree with  The patient is critically ill with multiple organ systems failure and requires high complexity decision making for assessment and support, frequent evaluation and titration of therapies, application of  advanced monitoring technologies and extensive interpretation of multiple databases.   Critical Care Time devoted to patient care services described in this note is  35  Minutes.  Dr. Kalman Shan, M.D., [facility].C.P Pulmonary and Critical Care Medicine Staff Physician [facility] Pager: [phone], If no answer or between  15:00h - 7:00h: call [phone]  02/25/2014 12:11 PM

## 2014-02-25 NOTE — Progress Notes (Signed)
02/25/14 1500  Clinical Encounter Type  Visited With Family;Health care provider (daughters Dondra SpryGail and Luster Landsbergenee; consult with RN)  Visit Type Spiritual support;Social support  Referral From Chaplain (Charles Lumpkin, DMin)  Spiritual Encounters  Spiritual Needs Emotional;Grief support  Stress Factors  Family Stress Factors Loss of control;Major life changes;Family relationships (facing loss of mother)   Per referral from night and weekend chaplain, Kerrin MoCharlie Lumpkin, who was present with family following code this weekend, visited with daughters Luster LandsbergRenee and Dondra SpryGail at bedside.  They are both working through the cognitive (and emotional) dissonance of seeing Ms Guerry's chest move with the ventilator and feeling her body heat while also hearing her very poor prognosis.  Per Ellouise NewerGail and Renee, pt's daughter [name] is here as well, but had stepped out; pt's five sons are planning to drive from [place] together this evening to join them here tomorrow.  Dondra SpryGail appears to be coping well, naming her sadness and grief, as well as her peace in knowing that her "mom was ready to go."  Renee names her own struggle with guilt as a component of her grief.  Provided pastoral presence, reflective listening, and encouragement to be mindful of opportunities for emotional and relational healing as family gathers together at bedside.  Spiritual Care will continue to follow closely, but please page as needs arise, especially as sons arrive or if pt's condition changes:  [phone].  Thank you.  [address]Chaplain Joseth Weigel [place] [phone]

## 2014-02-26 DIAGNOSIS — J9600 Acute respiratory failure, unspecified whether with hypoxia or hypercapnia: Secondary | ICD-10-CM

## 2014-02-26 DIAGNOSIS — Z515 Encounter for palliative care: Secondary | ICD-10-CM

## 2014-02-26 DIAGNOSIS — G20 Parkinson's disease: Secondary | ICD-10-CM

## 2014-02-26 DIAGNOSIS — I10 Essential (primary) hypertension: Secondary | ICD-10-CM

## 2014-02-26 LAB — GLUCOSE, CAPILLARY
GLUCOSE-CAPILLARY: 100 mg/dL — AB (ref 70–99)
GLUCOSE-CAPILLARY: 101 mg/dL — AB (ref 70–99)
Glucose-Capillary: 108 mg/dL — ABNORMAL HIGH (ref 70–99)

## 2014-02-26 MED ORDER — LORAZEPAM BOLUS VIA INFUSION: 1.0000 mg | INTRAVENOUS | Status: DC | PRN

## 2014-02-26 MED ORDER — LORAZEPAM 2 MG/ML IJ SOLN
0.5000 mg/h | INTRAVENOUS | Status: DC
  Administered 2014-02-26: 0.5 mg/h via INTRAVENOUS
  Filled 2014-02-26 (×2): qty 25

## 2014-02-26 MED ORDER — ATROPINE SULFATE 1 % OP SOLN
4.0000 [drp] | OPHTHALMIC | Status: DC | PRN
  Administered 2014-02-26: 4 [drp] via SUBLINGUAL
  Filled 2014-02-26: qty 2

## 2014-02-26 MED ORDER — MORPHINE BOLUS VIA INFUSION
1.0000 mg | INTRAVENOUS | Status: DC | PRN
  Filled 2014-02-26: qty 1

## 2014-02-26 MED ORDER — SCOPOLAMINE 1 MG/3DAYS TD PT72
1.0000 | MEDICATED_PATCH | TRANSDERMAL | Status: DC
  Administered 2014-02-26: 1.5 mg via TRANSDERMAL
  Filled 2014-02-26: qty 1

## 2014-02-26 MED ORDER — LORAZEPAM BOLUS VIA INFUSION
0.5000 mg | INTRAVENOUS | Status: DC | PRN
  Filled 2014-02-26: qty 1

## 2014-02-26 MED ORDER — MORPHINE SULFATE 10 MG/ML IJ SOLN
2.0000 mg/h | INTRAVENOUS | Status: DC
  Administered 2014-02-26: 2 mg/h via INTRAVENOUS
  Administered 2014-02-26: 3 mg/h via INTRAVENOUS
  Administered 2014-02-26 – 2014-02-27 (×2): 2 mg/h via INTRAVENOUS
  Filled 2014-02-26 (×2): qty 10

## 2014-02-26 NOTE — Progress Notes (Addendum)
Patient UJ:[id number]:Meagan [name]      DOB: 04/24/1935      NFA:[id number]  Family has decided that withdraw of vent to comfort is conducive to what their mother would want to promote quality and dignity.  Most family comfortable with their decisions.  Daughter Luster LandsbergRenee having a hard time but working thorugh issues that were out of all controls.  Weaning this am revealed no distress visibly from patient but RR did increase to 40's.  Plan to start morphine drip an premedicate prior to withdraw.  No signs of reasonable brain fuction present GCS 3 fixed pupils,  No response to pain , chin and tongue fasiculations.  Recommend : 1. DNR  2.  GCS confirmed 3 , pupils fixed, no withdraw to noxious stimuli, tongue fasiculations  3.  Start morphine drip 2 mg/hr with 1 mg q 15 min bolus and titration orders  4.  Ativan drip- 0.5 mg/hr, with ability to bolus q 15  Min prn for seizure   Withdraw vent  45 min after starting drips.  Use bite block if needed.  High risk for seizures- ativan drip and keppra to continue   Willard Madrigal L. Ladona Ridgelaylor, MD MBA The Palliative Medicine Team at [facility] Team Phone: [phone] Pager: [phone]

## 2014-02-26 NOTE — Progress Notes (Signed)
PULMONARY  / CRITICAL CARE MEDICINE  Name: Meagan [name] MRN: [id number] DOB: 02/10/1935    ADMISSION DATE:  02/12/2014 CONSULTATION DATE:  3/22  REFERRING MD :  Internal Medicine PRIMARY SERVICE: Internal Medicine  CHIEF COMPLAINT:  S/P Cardiac Arrest  BRIEF PATIENT DESCRIPTION: 79 yo female with parkinson's disease, severe dementia, hypertension who was found on the floor in asystole.  Underwent 18 minutes ACLS with ROSC following multiple rounds of epi, and once patient converted to Ventricular tachycardia, received amiodarone bolus followed by 2 DCC with ROSC.  SIGNIFICANT EVENTS / STUDIES:  UTI with Enterococcus 3/22 Switched from Ceftriaxone to Vanc 3/22 Cardiac arrest 3/22 LINES / TUBES:  ET Tube 3/22  CULTURES: No blood cultures Urine cultures : 3/18 Enterococcus Non-VRE  ANTIBIOTICS: Vancomcyin 3/22>> zoysn 3/22>>  HISTORY OF PRESENT ILLNESS:  79 yo female with parkinson's disease, severe dementia, hypertension who was found on the floor in asystole.  Underwent 18 minutes ACLS with ROSC following multiple rounds of epi, and once patient converted to Ventricular tachycardia, received amiodarone bolus followed by 2 DCC with ROSC.  Transferred to ICU for further stabilization and CCM service consulted.  Discussed case with hospitalist, question of progressive sepsis versus aspiration as history with patient being unable to clear secretions due to severity of illness, progressive dementia, and severe parkinson's disease.  Patient with failure to thrive at home, limited ability to perform any ADL's.  On bedside exam, patient with significant shock on levophed gtt, not breathing over the vent, tongue fasciculations, and mid fixed pupils (no history of atropine administration).  GCS at this time is 3.  Discussed case with family, a this time they are requesting DRN, no escalation of care, no heroic measures, and shifting of goals to focus on comfort.  At this time they would  like to continue current care, as son will be arriving on Tuesday.  Family understands patient's grave prognosis and request no invasive procedures at this time.  POA (Son) is in agreement.    Initially admitted on 3/18 with fever, AMS, from SNF due to fevers, poor oral intake and UTI.  Stable hemodynamics at this time.  Persistent fevers during admission, continued poor po intake, but no changes in hemodynamics.  Palliative care has been involved, discussing goals of care with family, but previously family wanted to continue aggressive measures.    SUBJECTIVE:  No escalation of care 3/23  resp describing thick golf ball sized mucus during intubation proceduer. Therefore suspect aphyxiation from mucus   02/26/14: Fentanyl gtt  Off x 8am - GCS 3 without gago. NP met with family: 3 were in favor of terminal wean + 3 undecided  + 1 wanting full code Palliative care to meet with family VITAL SIGNS: Temp:  [97.5 F (36.4 C)-102.2 F (39 C)] 97.7 F (36.5 C) (03/24 1000) Pulse Rate:  [50-87] 65 (03/24 1000) Resp:  [17-29] 22 (03/24 1000) BP: (95-140)/(42-59) 101/42 mmHg (03/24 1000) SpO2:  [100 %] 100 % (03/24 1000) FiO2 (%):  [40 %-50 %] 40 % (03/24 0845) HEMODYNAMICS:   VENTILATOR SETTINGS: Vent Mode:  [-] PRVC FiO2 (%):  [40 %-50 %] 40 % Set Rate:  [16 bmp] 16 bmp Vt Set:  [400 mL] 400 mL PEEP:  [5 cmH20] 5 cmH20 Plateau Pressure:  [15 cmH20-19 cmH20] 18 cmH20 INTAKE / OUTPUT: Intake/Output     03/23 0701 - 03/24 0700 03/24 0701 - 03/25 0700   I.V. (mL/kg) 499.6 (8.8) 47.5 (0.8)   IV Piggyback 472.5      Total Intake(mL/kg) 972.1 (17.1) 47.5 (0.8)   Urine (mL/kg/hr) 151 (0.1)    Total Output 151     Net +821.1 +47.5          PHYSICAL EXAMINATION: General:  Unresponsive, no distress, in shock Neuro:  GCS 3, pupils fixed and non reactive, fasciculations in tongue  HEENT:  Noted severe JVD,  Cardiovascular:  Tachycardic, no MRG,  Lungs:  CLEAR without rhonchi, rales, or wheezes,  unable to appreciate left lobe infiltrate  Abdomen:  Non tender, normal bowel sounds, noted abdominal scars Musculoskeletal:  No LE, no movement, thin and cachectic  Skin:  No breakdown, warm and dry  LABS:  PULMONARY  Recent Labs Lab 02/24/14 2005 02/24/14 2118 02/25/14 0200  PHART 7.181* 7.354 7.520*  PCO2ART 43.8 32.4* 27.8*  PO2ART 276.0* 491.0* 311.0*  HCO3 15.7* 17.6* 22.5  TCO2 15.6 16.4 20.4  O2SAT 98.7 99.4 99.4    CBC  Recent Labs Lab 03/01/2014 1447 02/21/14 0305 02/24/14 2020  HGB 12.2 11.5* 8.4*  HCT 37.1 36.6 27.0*  WBC 9.0 10.5 9.3  PLT 242 238 179    COAGULATION No results found for this basename: INR,  in the last 168 hours  CARDIAC    Recent Labs Lab 02/24/14 2020  TROPONINI <0.30   No results found for this basename: PROBNP,  in the last 168 hours   CHEMISTRY  Recent Labs Lab 02/23/2014 1447 02/21/14 0305 02/23/14 1243 02/24/14 2020  NA 149* 150* 148* 150*  K 4.0 3.6* 3.5* 3.8  CL 107 108 110 109  CO2 _0 15*  GLUCOSE 132* 129* 127* 230*  BUN 47* 47* 22 22  CREATININE 0.61 0.56 0.42* 0.62  CALCIUM 9.9 9.4 8.7 8.0*   Estimated Creatinine Clearance: 53.7 ml/min (by C-G formula based on Cr of 0.62).   LIVER  Recent Labs Lab 02/21/14 0305 02/24/14 2020  AST 26 205*  ALT 17 100*  ALKPHOS 72 139*  BILITOT 0.8 0.8  PROT 7.0 4.9*  ALBUMIN 2.5* 1.3*     INFECTIOUS  Recent Labs Lab 02/21/2014 1456 02/24/14 2020 02/25/14 0335  LATICACIDVEN 1.37 12.0* 7.4*     ENDOCRINE CBG (last 3)   Recent Labs  02/25/14 2019 02/25/14 2343 02/26/14 0739  GLUCAP 91 100* 101*         IMAGING x48h  Dg Chest Port 1 View  02/24/2014   CLINICAL DATA:  Acute respiratory failure. Intubation. Urinary tract infection. Parkinson's disease.  EXAM: PORTABLE CHEST - 1 VIEW  COMPARISON:  02/13/2014  FINDINGS: Endotracheal tube is seen in appropriate position with tip approximately 3 cm above the carina.  External objects are  seen overlying the left lower hemi thorax, however asymmetric opacity is noted in the left lower lung, suspicious for left lower lobe infiltrate. Right lung is clear. No evidence of pleural effusion. Heart size is within normal limits. Ectasia of the thoracic aorta remains stable.  IMPRESSION: Endotracheal tube in appropriate position.  Suspect left lower lobe infiltrate. Pneumonia cannot be excluded. Radiographic followup is recommended.   Electronically Signed   By: Earle Gell M.D.   On: 02/24/2014 20:49       ASSESSMENT / PLAN:  PULMONARY A:  Respiratory failure Patient with history of inability to clear secretions, possible aspiration with multiple risk factors, developing left lower infiltrate. Hypoxia likely precipitated arrest.  Significant acidosis likely due to metabolic requirements, unable to protect airway.   P: Vent bundle  CARDIOVASCULAR A:   Undifferentiated Shock s/p  asystolic cardiac arrest Aspiration/hypoxic induced arrest, post cardiac arrest reperfusion syndrome, evolving sepsis Again goals of care as per family are to focus on comfort and not heroic measures  P:  Patient DNR, no escalation of care, no lines, no additional pressors, no compressions or heroic measures. Neo for MAP >70-75 post arrest Glycemic control with SSI 30 cc/kg infusion of balanced salt solution, patient with JVD with likely long standing cardiac dysfunction.  No evidence of pnx, and once PEEP applied with MV resolution of hypoxia.  Pending electrolytes Zosyn/Vancomycin (coverage of known UTI and developing pneumonia HCAP/Aspiration) Grave prognosis, family understands the patient could pass despite efforts  A:  Vtach During ACLS, patient with Tanna Furry, converted with [place]  P:   Monitor, support hemodynamics, avoid hypoxia Will not continue gtt at this time, again, likely hypoxic/hypoperfusion related.  Support those two issues, monitor for arrhythmia.  RENAL A:  Electrolytes  P:    Replace as needed Foley in place  GASTROINTESTINAL A:  Stress ulcer prophylaxis P:   On protonix  HEMATOLOGIC A:  DVT prophylaxis  P:  Heparin tid  INFECTIOUS A:  Severe Sepsis Patient with Enterococcus UTI Possible aspiration injury P:   See above  Continue broad spectrum antibiotics Ventilator support for oxygenation/metabolic requirements/acidosis/inability to protect airway Phenylephrine 60 mcg/min through a peripheral line Family requesting no aggressive interventions, defering arterial and central line placement Plan on comfort care measures in next 48 hours, awaiting son to arrive  ENDOCRINE A:  Glycemic Control   P:   Subcutaneous insuline  NEUROLOGIC A:  Anoxic brain injury Patient with tongue fasciculations, fixed pupils Baseline line dementia, unable to perform ADLs P:   Again, patient's family understands grave prognosis and wishes, on arrival of son, to initiate comfort measures. Would not benifit from therapeutic hypothermia due to baseline status and presenting rhythm being asystole. Keppra IV Ativan PRN    STAFF NOTE: I, Dr Ann Lions have personally reviewed patient's available data, including medical history, events of note, physical examination and test results as part of my evaluation. I have discussed with resident/NP and other care providers such as pharmacist, RN and RRT.  In addition,  I personally evaluated patient and elicited key findings of acute reso failure, cardiac arrest likely due to mucus related asphyxiation. Asystole. Await family meet with palliative care to decide goals of care given lack of conssensus within family.   Rest per NP/medical resident whose note is outlined above and that I agree with  The patient is critically ill with multiple organ systems failure and requires high complexity decision making for assessment and support, frequent evaluation and titration of therapies, application of advanced monitoring technologies and  extensive interpretation of multiple databases.   Critical Care Time devoted to patient care services described in this note is  35  Minutes.  Dr. Brand Males, M.D., [facility]C.P Pulmonary and Critical Care Medicine Staff Physician [place] [facility] Pager: [phone], If no answer or between  15:00h - 7:00h: call [phone]  02/26/2014 11:19 AM

## 2014-02-26 NOTE — Progress Notes (Addendum)
Chaplain follow up d/t impending vent wean, anticipatory grief, end of life  Provided emotional support with family at bedside.  Family supporting one another appropriately and recounting valuable lessons learned in Mrs. Divirgilio's life.  Chaplain will be present for ongoing support as needed.   Burnis KingfisherStalnaker, Tilley Faeth [place] MDiv  [phone]

## 2014-02-26 NOTE — Procedures (Signed)
Extubation Procedure Note  Patient Details:   Name: Meagan [name] DOB: November 03, 1935 MRN: [id number]   Airway Documentation:   Pt extubated per MD order. Withdrawal of life guidelines followed. RN & family at bedside.  Evaluation  O2 sats: stable throughout Complications: No apparent complications Patient did tolerate procedure well. Bilateral Breath Sounds: Diminished Suctioning: Airway;Oral No  Kendall FlackJackson, Lyncoln Maskell Royal 02/26/2014, 3:23 PM

## 2014-02-27 NOTE — Plan of Care (Signed)
Problem: Phase I Progression Outcomes Goal: Pain controlled with appropriate interventions Outcome: Completed/Met Date Met:  02/27/14 Pt on Morphine and Ativan drips and is resting well. Goal: OOB as tolerated unless otherwise ordered Outcome: Not Progressing Pt is a DNR.

## 2014-02-27 NOTE — Progress Notes (Signed)
Nutrition Brief Note  Pt extubated yesterday afternoon as part of terminal wean. Pt now full comfort care. Nutrition signing off.   Levon HedgerHeather Baron MS, RD, LDN [phone] Pager [phone] After Hours Pager

## 2014-02-27 NOTE — Progress Notes (Signed)
PULMONARY  / CRITICAL CARE MEDICINE  Name: Meagan [name] MRN: [id number] DOB: Feb 02, 1935    ADMISSION DATE:  02/19/2014 CONSULTATION DATE:  3/22  REFERRING MD :  Internal Medicine PRIMARY SERVICE: Internal Medicine  CHIEF COMPLAINT:  S/P Cardiac Arrest  BRIEF PATIENT DESCRIPTION: 79 yo female with parkinson's disease, severe dementia, hypertension who was found on the floor in asystole.  Underwent 18 minutes ACLS with ROSC following multiple rounds of epi, and once patient converted to Ventricular tachycardia, received amiodarone bolus followed by 2 [facility] with ROSC.  SIGNIFICANT EVENTS / STUDIES:  UTI with Enterococcus 3/22 Switched from Ceftriaxone to Vanc 3/22 Cardiac arrest 3/22 3/24 full comfort care with mso4 and ativan drip. LINES / TUBES:  ET Tube 3/22>>3/24  CULTURES: No blood cultures Urine cultures : 3/18 Enterococcus Non-VRE  ANTIBIOTICS: Vancomcyin 3/22>>3/24 zoysn 3/22>>3/24  HISTORY OF PRESENT ILLNESS:  79 yo female with parkinson's disease, severe dementia, hypertension who was found on the floor in asystole.  Underwent 18 minutes ACLS with ROSC following multiple rounds of epi, and once patient converted to Ventricular tachycardia, received amiodarone bolus followed by 2 [facility] with ROSC.  Transferred to ICU for further stabilization and CCM service consulted.  Discussed case with hospitalist, question of progressive sepsis versus aspiration as history with patient being unable to clear secretions due to severity of illness, progressive dementia, and severe parkinson's disease.  Patient with failure to thrive at home, limited ability to perform any ADL's.  On bedside exam, patient with significant shock on levophed gtt, not breathing over the vent, tongue fasciculations, and mid fixed pupils (no history of atropine administration).  GCS at this time is 3.  Discussed case with family, a this time they are requesting DRN, no escalation of care, no heroic measures,  and shifting of goals to focus on comfort.  At this time they would like to continue current care, as son will be arriving on Tuesday.  Family understands patient's grave prognosis and request no invasive procedures at this time.  POA (Son) is in agreement.    Initially admitted on 3/18 with fever, AMS, from SNF due to fevers, poor oral intake and UTI.  Stable hemodynamics at this time.  Persistent fevers during admission, continued poor po intake, but no changes in hemodynamics.  Palliative care has been involved, discussing goals of care with family, but previously family wanted to continue aggressive measures.    SUBJECTIVE:  Full comfort care  VITAL SIGNS: Temp:  [97.7 F (36.5 C)-99 F (37.2 C)] 99 F (37.2 C) (03/24 1500) Pulse Rate:  [58-116] 116 (03/25 0800) Resp:  [17-26] 26 (03/25 0800) BP: (101-108)/(42-51) 108/51 mmHg (03/24 1400) SpO2:  [87 %-100 %] 87 % (03/25 0800) HEMODYNAMICS:   VENTILATOR SETTINGS:   INTAKE / OUTPUT: Intake/Output     03/24 0701 - 03/25 0700 03/25 0701 - 03/26 0700   I.V. (mL/kg) 90 (1.6)    Other 330 20   IV Piggyback 210    Total Intake(mL/kg) 630 (11.1) 20 (0.4)   Urine (mL/kg/hr)  5 (0)   Total Output   5   Net +630 +15          PHYSICAL EXAMINATION: General:  Unresponsive Neuro:  GCS 3, pupils fixed and non reactive, fasciculations in tongue  HEENT:  Noted severe JVD,  Cardiovascular:  Tachycardic, no MRG,  Lungs: decrease bs, some accessory muscle use Abdomen:  Non tender, normal bowel sounds, noted abdominal scars Musculoskeletal:  No LE, no movement, thin and  cachectic  Skin:  No breakdown, warm and dry  LABS:  PULMONARY  Recent Labs Lab 02/24/14 2005 02/24/14 2118 02/25/14 0200  PHART 7.181* 7.354 7.520*  PCO2ART 43.8 32.4* 27.8*  PO2ART 276.0* 491.0* 311.0*  HCO3 15.7* 17.6* 22.5  TCO2 15.6 16.4 20.4  O2SAT 98.7 99.4 99.4    CBC  Recent Labs Lab 02/04/2014 1447 02/21/14 0305 02/24/14 2020  HGB 12.2 11.5*  8.4*  HCT 37.1 36.6 27.0*  WBC 9.0 10.5 9.3  PLT 242 238 179    COAGULATION No results found for this basename: INR,  in the last 168 hours  CARDIAC    Recent Labs Lab 02/24/14 2020  TROPONINI <0.30   No results found for this basename: PROBNP,  in the last 168 hours   CHEMISTRY  Recent Labs Lab 02/09/2014 1447 02/21/14 0305 02/23/14 1243 02/24/14 2020  NA 149* 150* 148* 150*  K 4.0 3.6* 3.5* 3.8  CL 107 108 110 109  CO2 25 26 23  15*  GLUCOSE 132* 129* 127* 230*  BUN 47* 47* 22 22  CREATININE 0.61 0.56 0.42* 0.62  CALCIUM 9.9 9.4 8.7 8.0*   Estimated Creatinine Clearance: 53.7 ml/min (by C-G formula based on Cr of 0.62).   LIVER  Recent Labs Lab 02/21/14 0305 02/24/14 2020  AST 26 205*  ALT 17 100*  ALKPHOS 72 139*  BILITOT 0.8 0.8  PROT 7.0 4.9*  ALBUMIN 2.5* 1.3*     INFECTIOUS  Recent Labs Lab 02/10/2014 1456 02/24/14 2020 02/25/14 0335  LATICACIDVEN 1.37 12.0* 7.4*     ENDOCRINE CBG (last 3)   Recent Labs  02/25/14 2343 02/26/14 0739 02/26/14 1149  GLUCAP 100* 101* 108*         IMAGING x48h  No results found.    ASSESSMENT / PLAN:  PULMONARY A:  Respiratory failure Patient with history of inability to clear secretions, possible aspiration with multiple risk factors, developing left lower infiltrate. Hypoxia likely precipitated arrest.  Significant acidosis likely due to metabolic requirements, unable to protect airway.  P: 3/24 terminal extubation  CARDIOVASCULAR A:   Undifferentiated Shock s/p asystolic cardiac arrest Aspiration/hypoxic induced arrest, post cardiac arrest reperfusion syndrome, evolving sepsis Again goals of care as per family are to focus on comfort and not heroic measures  P:  Patient DNR,  Comfort care A:  Vtach During ACLS, patient with Vtach, converted with DCC  P:   Comfort care  RENAL A:  Electrolytes  P:   Comfort care  GASTROINTESTINAL A:  Stress ulcer prophylaxis P:    Comfort care  HEMATOLOGIC A:  DVT prophylaxis  P:  Comfort care  INFECTIOUS A:  Severe Sepsis Patient with Enterococcus UTI Possible aspiration injury P:   Comfort care  ENDOCRINE A:  Glycemic Control   P:   Subcutaneous insuline  NEUROLOGIC A:  Anoxic brain injury Patient with tongue fasciculations, fixed pupils Baseline line dementia, unable to perform ADLs P:   Cont Keppra to avoid clonus or seizures Comfort care   Brett CanalesSteve Minor ACNP [facility] PCCM Pager [phone] till 3 pm If no answer page [phone] 02/27/2014, 9:17 AM  Levy Pupaobert Bell Carbo, MD, PhD 02/27/2014, 11:50 AM  Pulmonary and Critical Care [phone] or if no answer [phone]

## 2014-03-06 NOTE — Progress Notes (Signed)
70ml wasted from 100ml bag of Morphine 1mg /ml bag. 5ml wasted from 50ml bag of Ativan 1mg /ml. Both medications wasted in sink and witnessed by Grenada[place]ittany Napier,RN.

## 2014-03-06 NOTE — Discharge Summary (Signed)
DISCHARGE SUMMARY    Date of admit: March 08, 2014  1:45 PM Date of discharge: 02/16/2014  1:18 AM Length of Stay: 8 days  PCP is BAITY, REGINA, NP   PROBLEM LIST Principal Problem:   UTI (lower urinary tract infection) Active Problems:   Parkinson disease   HTN (hypertension)   Hypernatremia   Protein-calorie malnutrition, severe   Cardiac arrest   DNAR (do not attempt resuscitation)   Palliative care encounter   Acute respiratory failure with hypoxia   Shock circulatory    SUMMARY Meagan [name] was 79 y.o. patient with    has a past medical history of Hypertension; Tremor; History of gallstones; H/O urinary tract infection; Urine incontinence; and Parkinson disease.   has past surgical history that includes Shoulder surgery; Knee surgery; and Colonoscopy.   Admitted on March 08, 2014 with    79 yo female with parkinson's disease, severe dementia, hypertension who was found on the floor in asystole. Underwent 18 minutes ACLS with ROSC following multiple rounds of epi, and once patient converted to Ventricular tachycardia, [facility]   SIGNIFICANT EVENTS / STUDIES:  UTI with Enterococcus 3/22  Switched from Ceftriaxone to Vanc 3/22  Cardiac arrest 3/22  3/24 full comfort care with mso4 and ativan drip     Patient expired 02/23/2014          SIGNED Dr. Kalman ShanMurali Charlesia Canaday, M.D., F.C.C.P Pulmonary and Critical Care Medicine Staff Physician [facility] and Critical Care Pager: [phone], If no answer or between  15:00h - 7:00h: call [phone]  03/06/2014 6:50 AM

## 2014-03-06 NOTE — Progress Notes (Signed)
Care of pt assumed at this time.  Pt appears to be resting comfortably in bed without signs of distress.  Morphine and Ativan drips continuing per orders.  Family at bedside.  Pt turned to R side for comfort.  Will continue to monitor.  Ardyth GalAnderson, Quinzell Malcomb Ann, RN 02/15/2014

## 2014-03-06 NOTE — Progress Notes (Signed)
Staff nurse asked Chaplain to visit with patient and daughter.  Spent about hour and a half with both.  Patient was not responding to sounds or touch and eventually died while Chaplain was in the room.  Daughter was all over the place in her conversation, from doting daughter to embittered sibling at odds with the rest of her family, to mother missing her children and grandmother missing her grandchildren.  She all but admitted to a drinking problem.    She did not want to lose her mother, and possibly finalize the loss (disenfranchisement) of her siblings (they live in the area; she in [place]).  There was a lot of voicing of this bitterness toward her siblings, and they toward her.  The Chaplain believes the actual death will be mourned within a normative process, but she has so many other compounding issues and burdens.  Stayed for almost an hour after the death.  While the patient was still alive, Chaplain did pray for/with the patient.  Rema Jasmineichard Doralee Kocak, Chaplain Pager: [phone]

## 2014-03-06 NOTE — Progress Notes (Signed)
Called to room by room by pt's daughter and the Chaplain of the hospital due the pt's respiratory status declining. Upon assessment, pt was not breathing and no heart sounds or lung sounds auscultated. No pulse felt upon palpitation. Cardiac monitor shows asystole. Becky,RN called to room to verify. Time of death called at 0118.

## 2014-03-06 DEATH — deceased

## 2014-03-20 ENCOUNTER — Ambulatory Visit: Payer: Medicare Other | Admitting: Neurology

## 2014-03-22 ENCOUNTER — Ambulatory Visit: Payer: Medicare Other | Admitting: Neurology

## 2015-01-21 IMAGING — CR DG CHEST 2V
2 series · 2 of 2 positions shown · non-contrast
Comparison: 01/21/2014

CLINICAL DATA: Pain.  Hypertension.  Dementia.

EXAM:
CHEST  2 VIEW

[w chest lat]
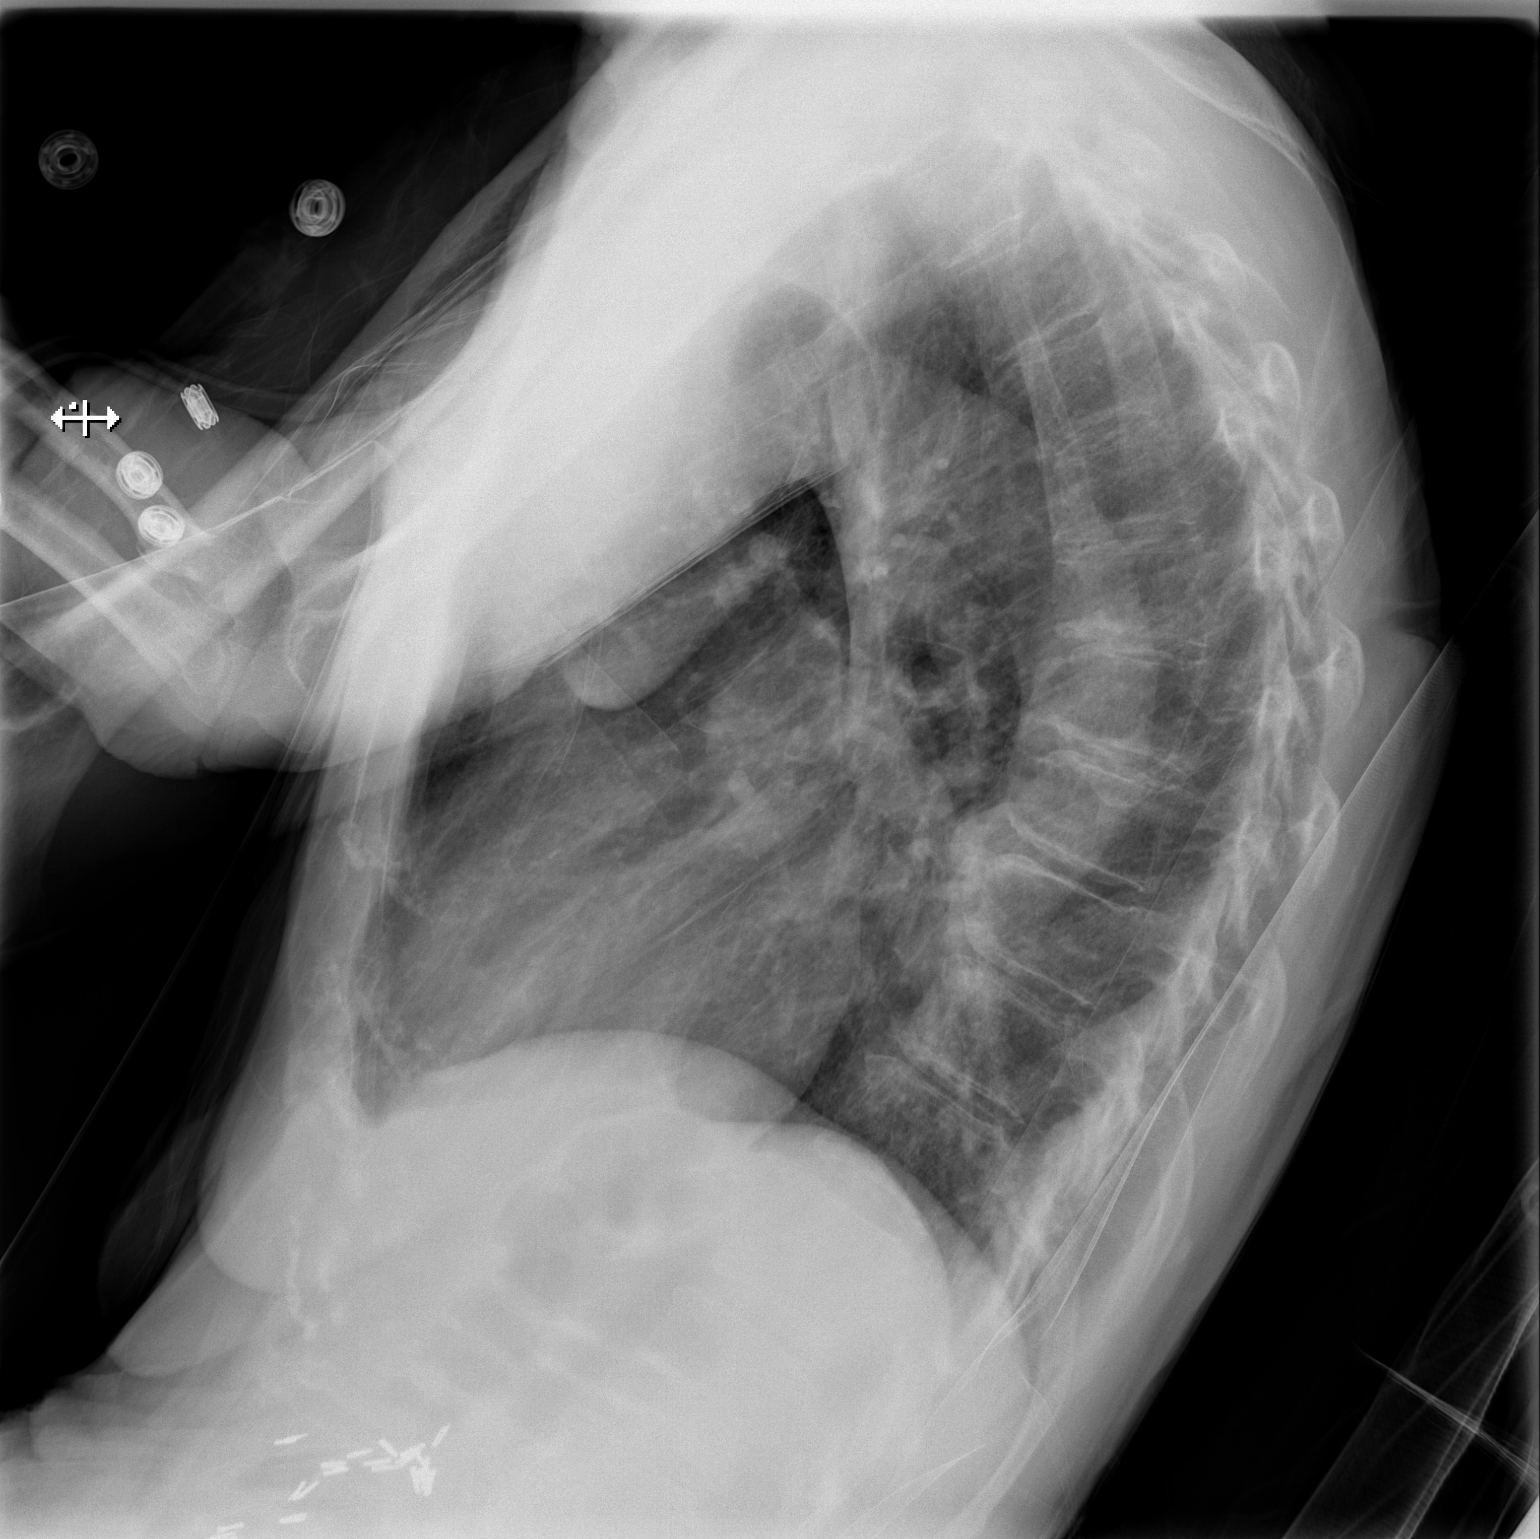

[x chest ap]
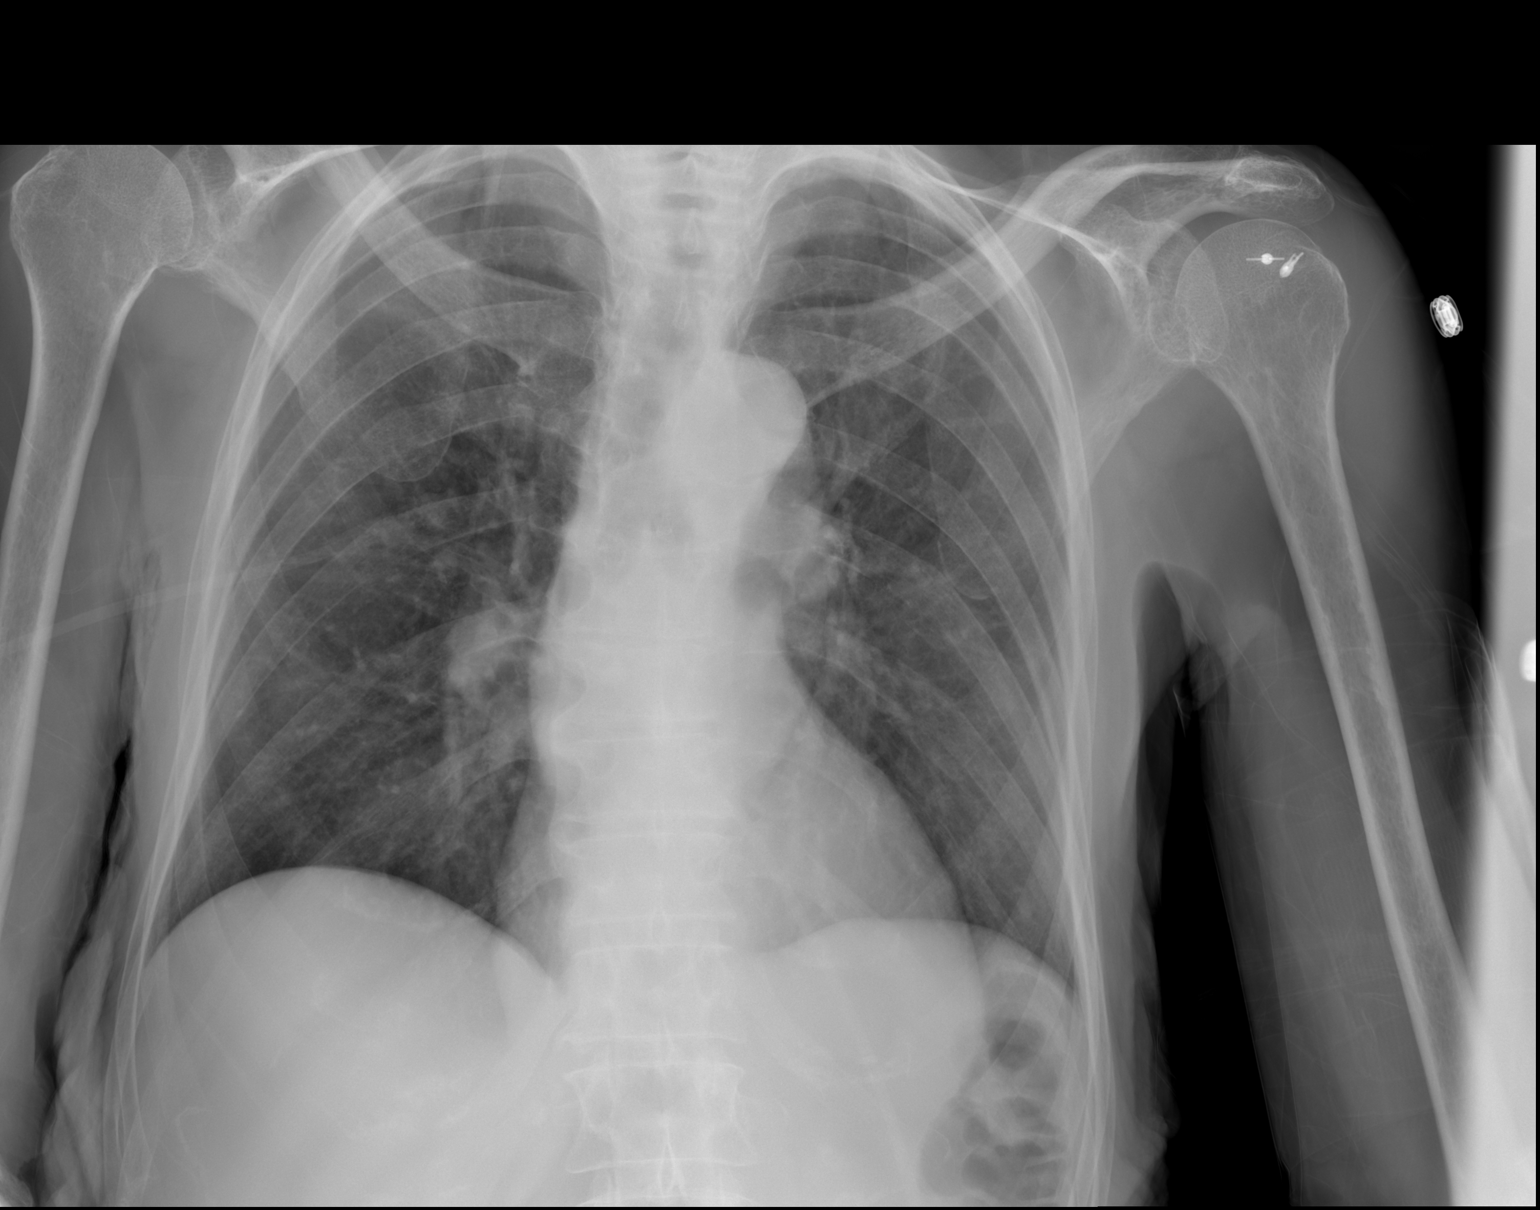

[2 of 2 positions shown; findings below may reference images not displayed]

FINDINGS: Heart size and pulmonary vascularity are normal. There is tortuosity
of the thoracic aorta. Slight accentuation of the upper thoracic
kyphosis, stable. No effusions.
IMPRESSION: No acute abnormality.

## 2015-02-14 IMAGING — CR DG CHEST 2V
3 series · 3 of 3 positions shown · non-contrast
Comparison: DG CHEST 2 VIEW dated 01/27/2014

CLINICAL DATA: SOB

EXAM:
CHEST  2 VIEW

[w chest lat (1 of 2)]
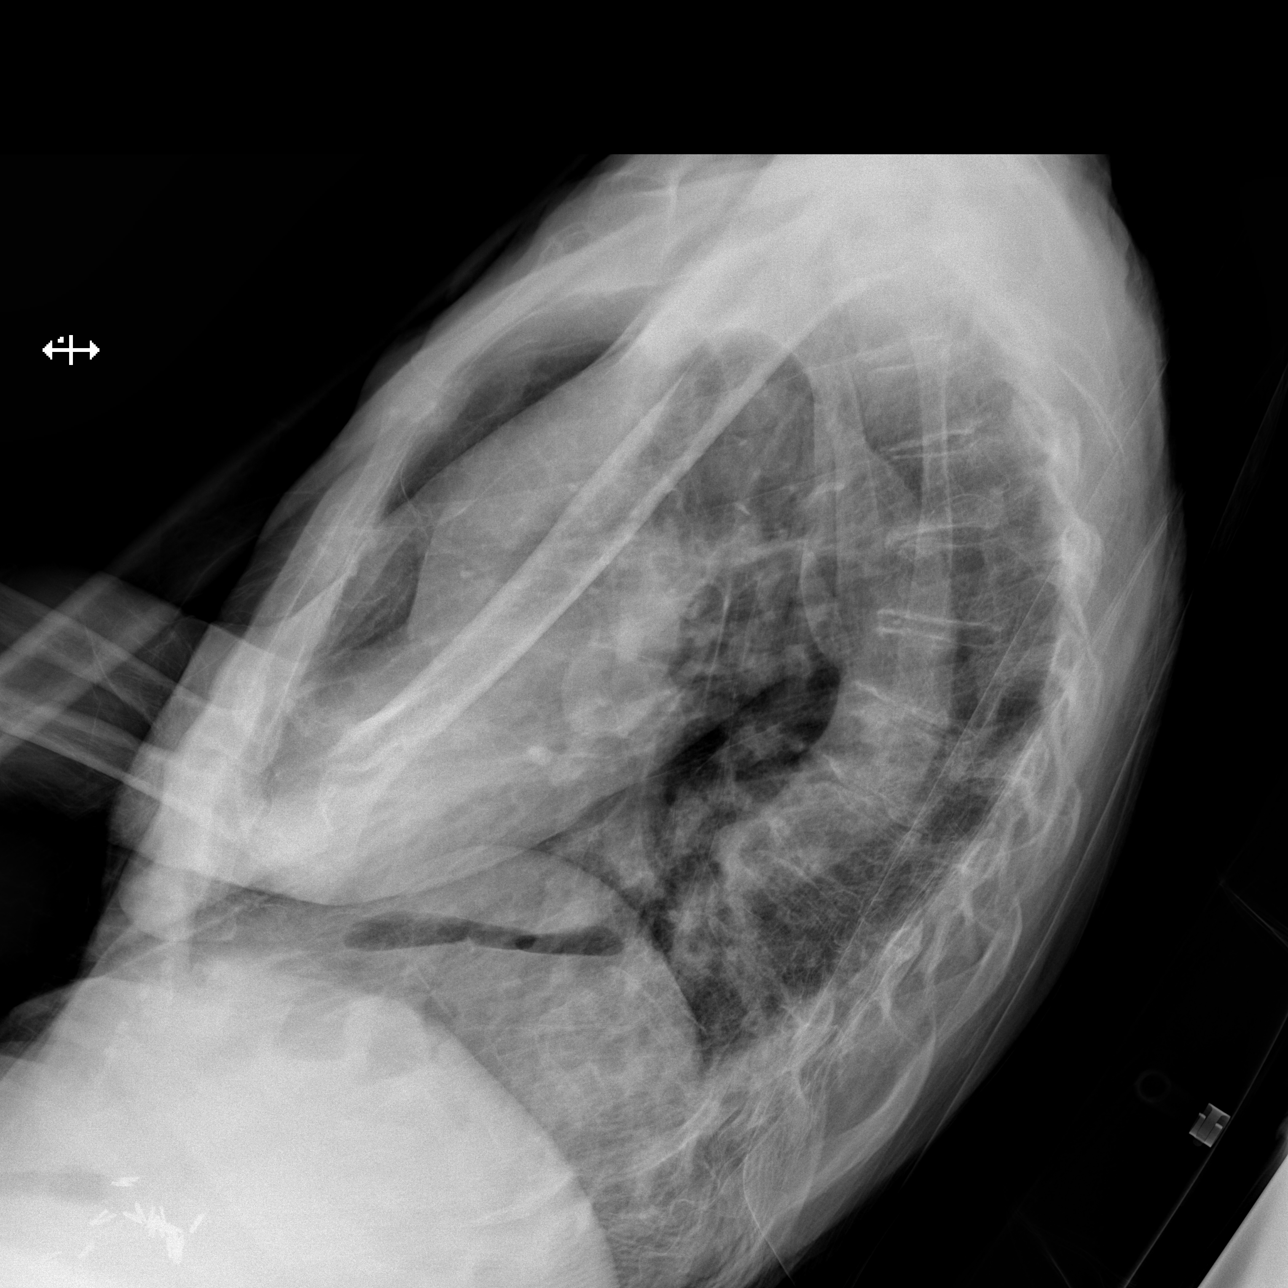

[w chest lat (2 of 2)]
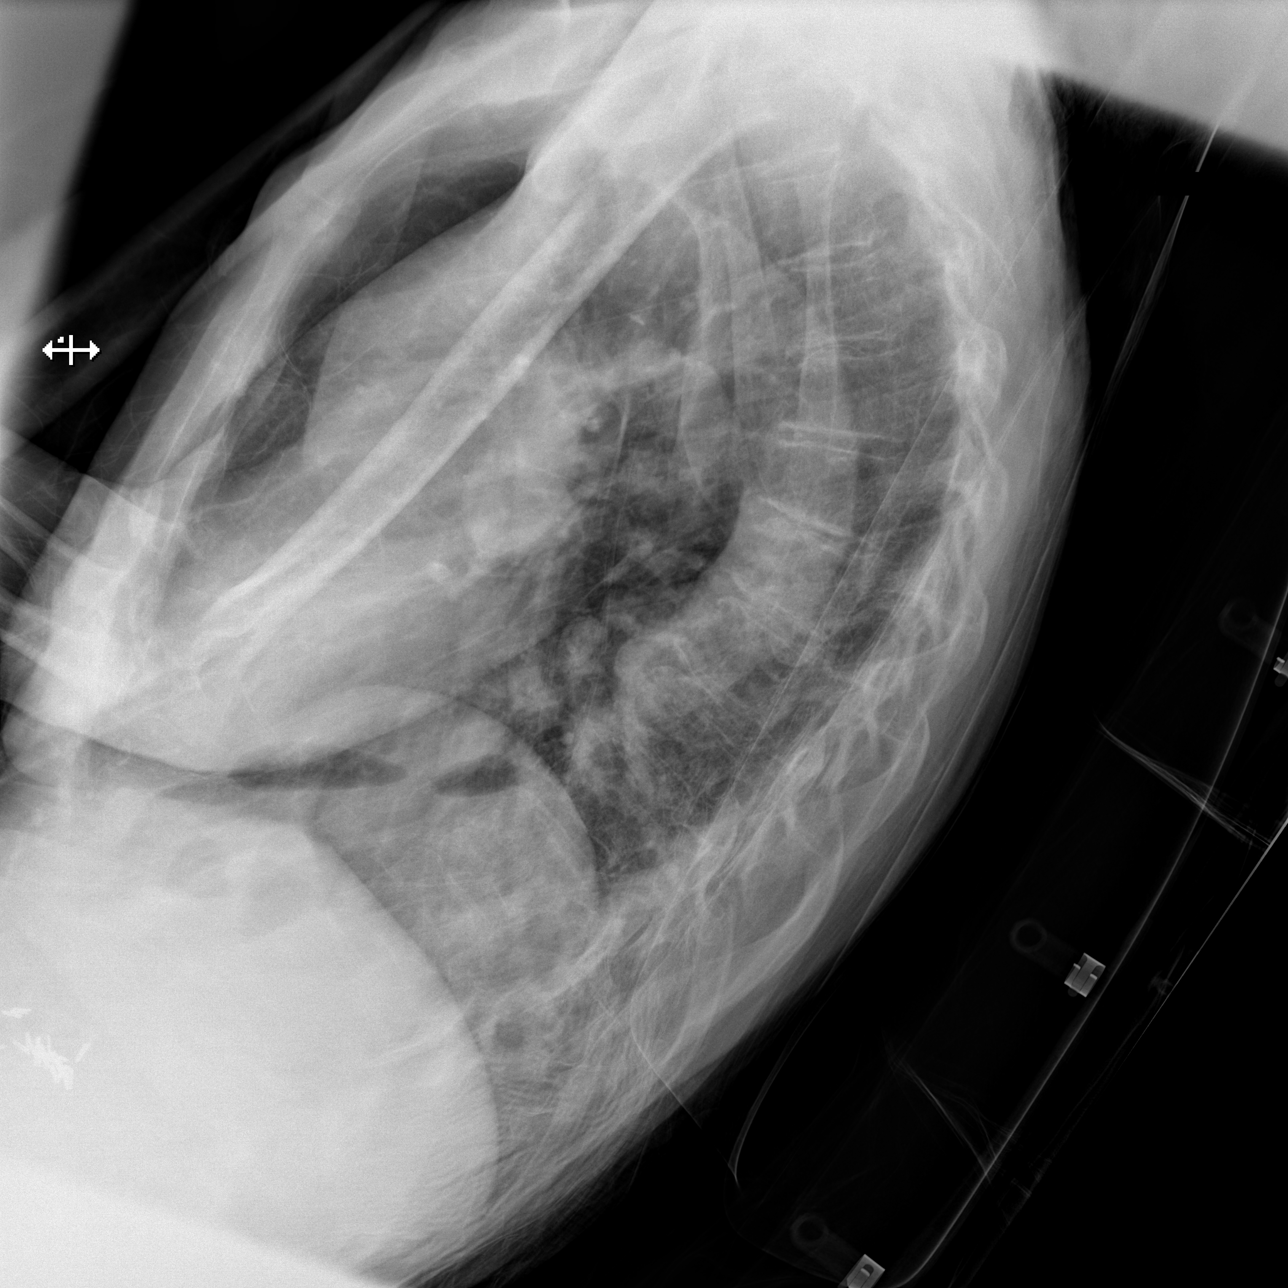

[x chest ap]
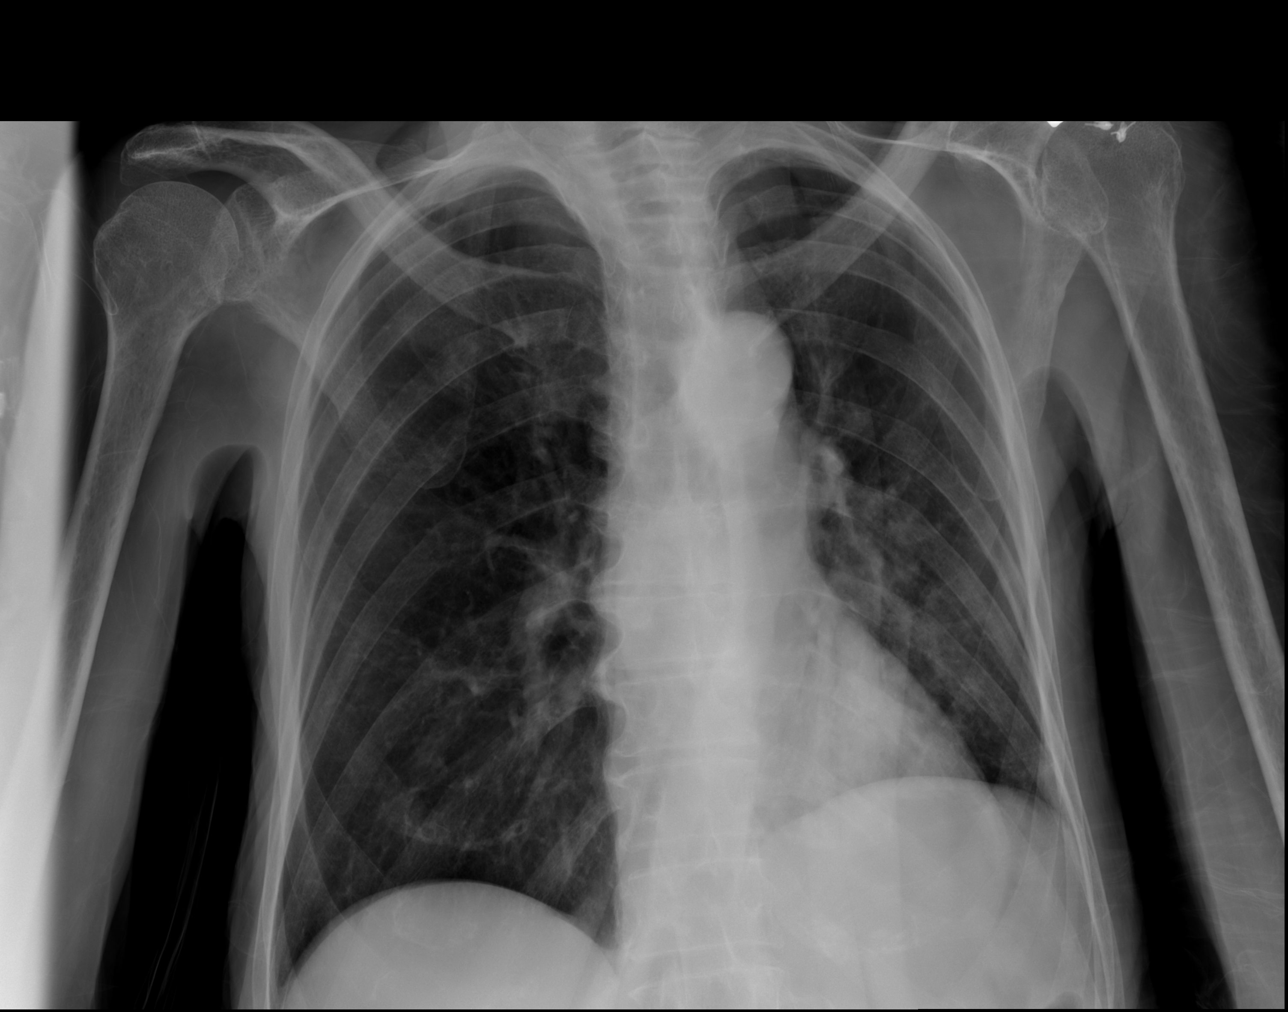

[3 of 3 positions shown; findings below may reference images not displayed]

FINDINGS: There is elevation of the left hemidiaphragm. Cardiac silhouette is
within normal limits. There is no evidence of focal infiltrates,
effusions or edema. Degenerative changes appreciated involving the
left shoulder.
IMPRESSION: No evidence of acute cardiopulmonary disease.

## 2015-02-14 IMAGING — CT CT HEAD W/O CM
2 series · 17 of 30 positions shown, 20 images · non-contrast
Comparison: None.

CLINICAL DATA: Severe headache

EXAM:
CT HEAD WITHOUT CONTRAST
TECHNIQUE: Contiguous axial images were obtained from the base of the skull
through the vertex without intravenous contrast.

[Series 2: head w/o · axial · non-contrast · 0.48mm/px · z∈[-112,+8]mm · 9 of 31 slices shown, 12 images]
[im 4/31  brain]
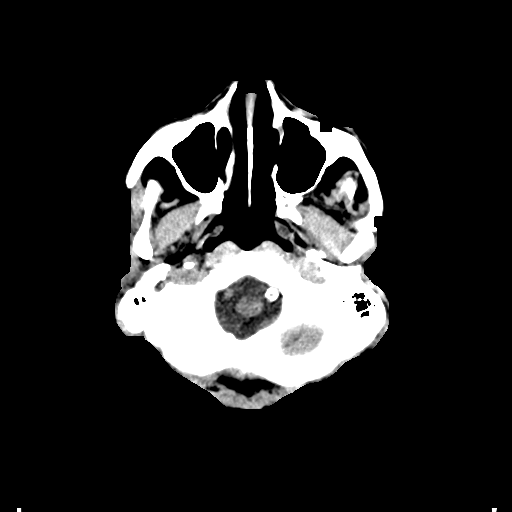
[im 4/31  bone]
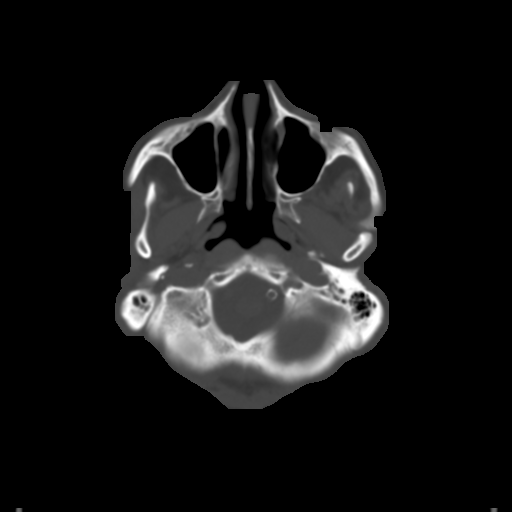
[im 7/31  brain]
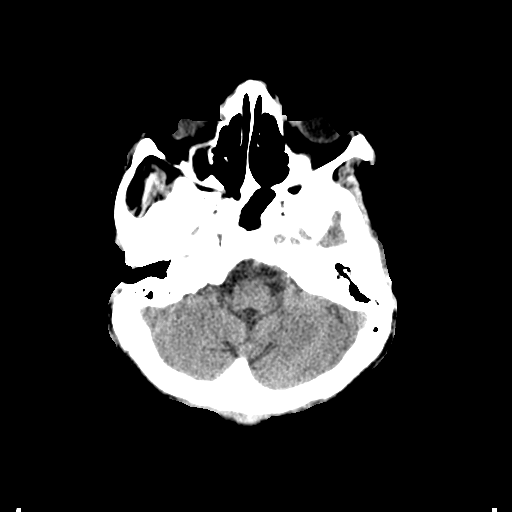
[im 10/31  brain]
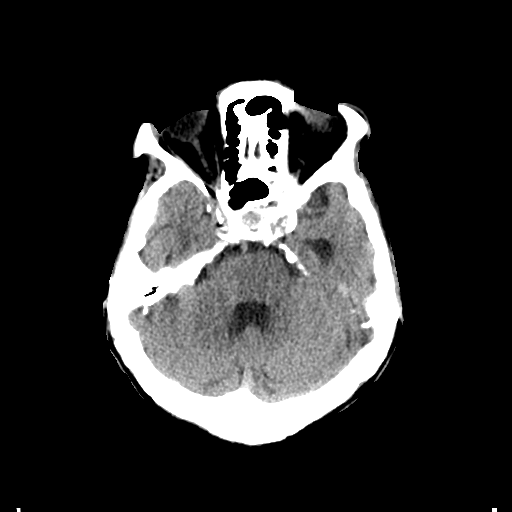
[im 13/31  brain]
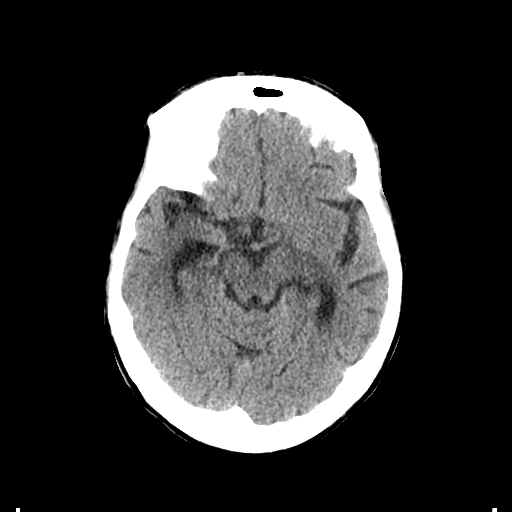
[im 16/31  brain]
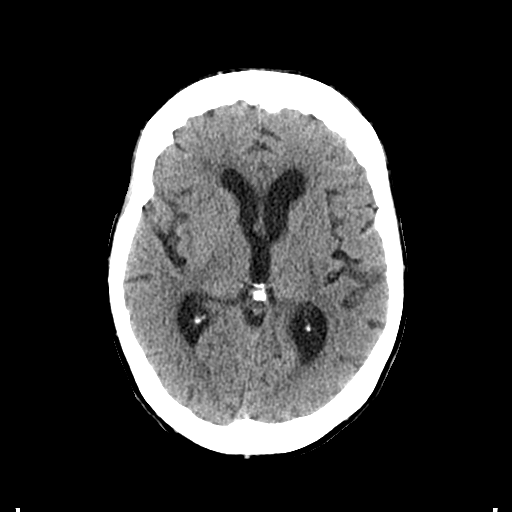
[im 16/31  bone]
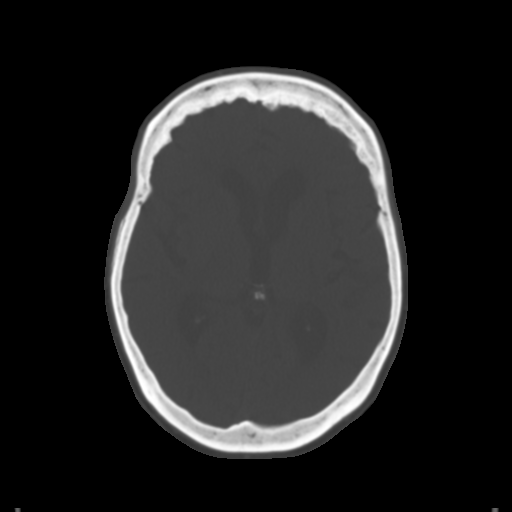
[im 19/31  brain]
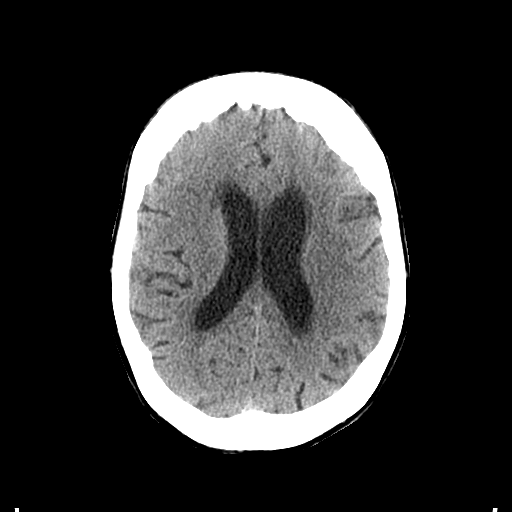
[im 22/31  brain]
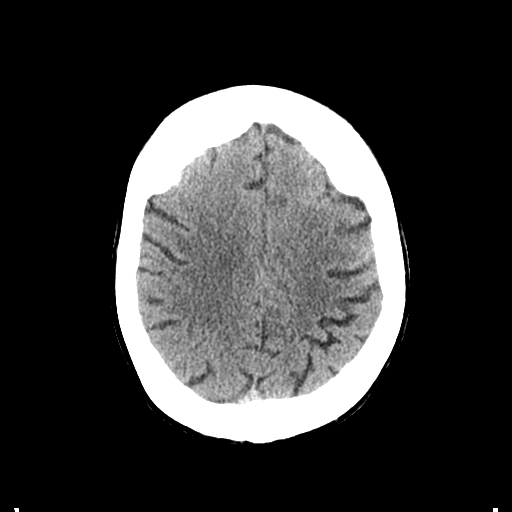
[im 25/31  brain]
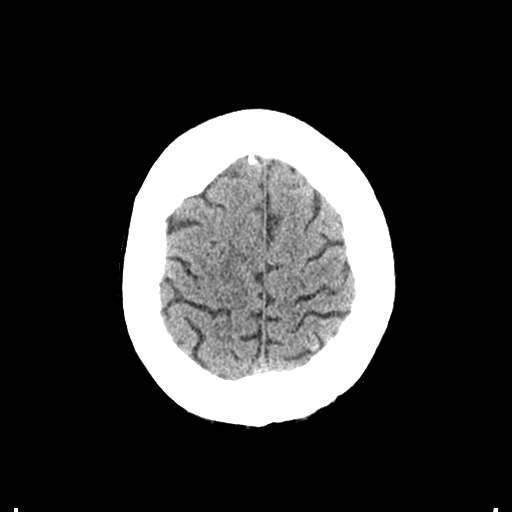
[im 28/31  brain]
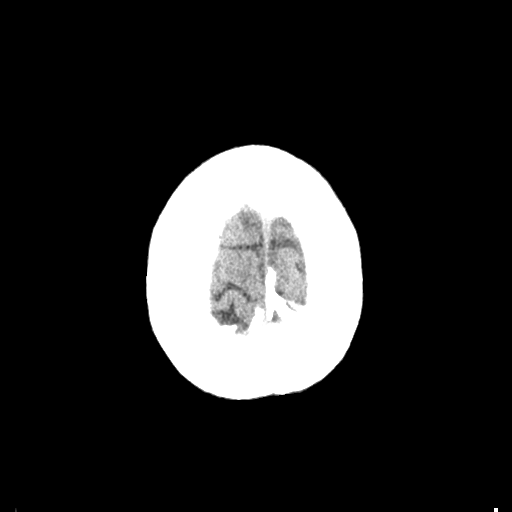
[im 28/31  bone]
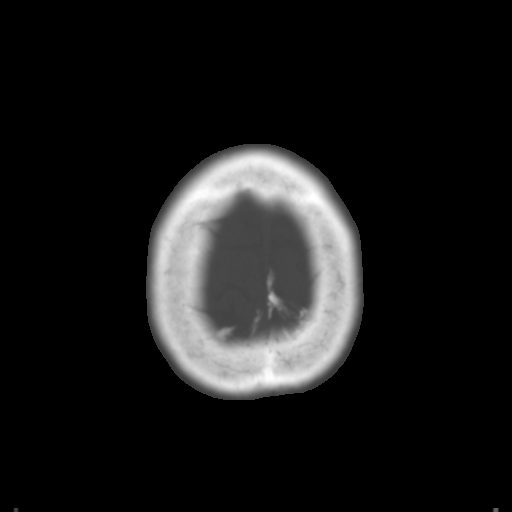

[Series 3: bone windows · axial · 0.48mm/px · z∈[-112,+5]mm · 8 of 51 slices shown]
[im 6/51  bone]
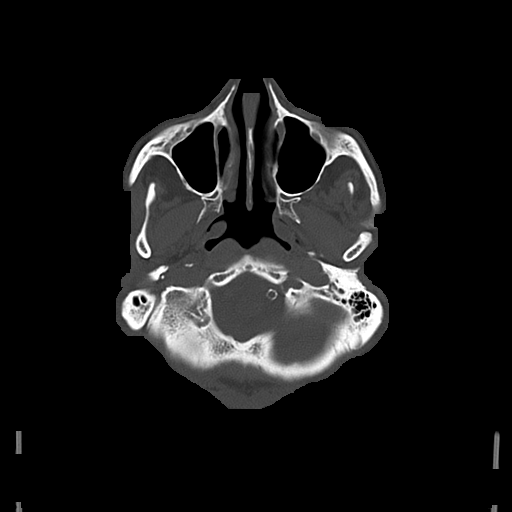
[im 12/51  bone]
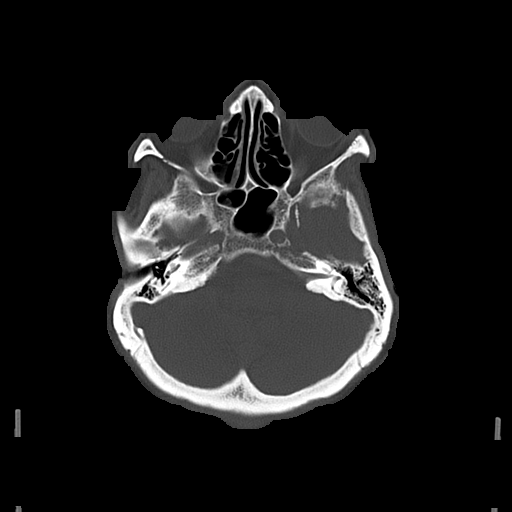
[im 17/51  bone]
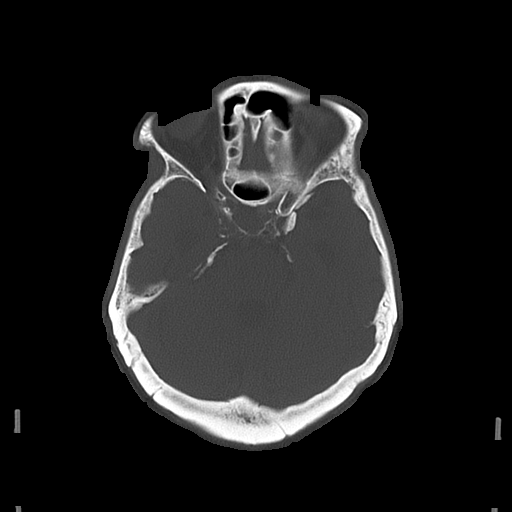
[im 23/51  bone]
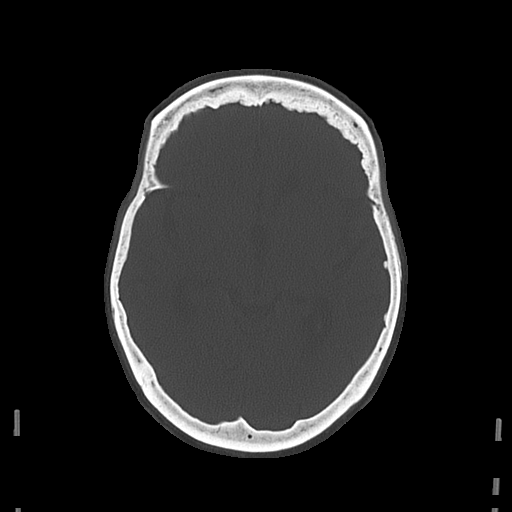
[im 28/51  bone]
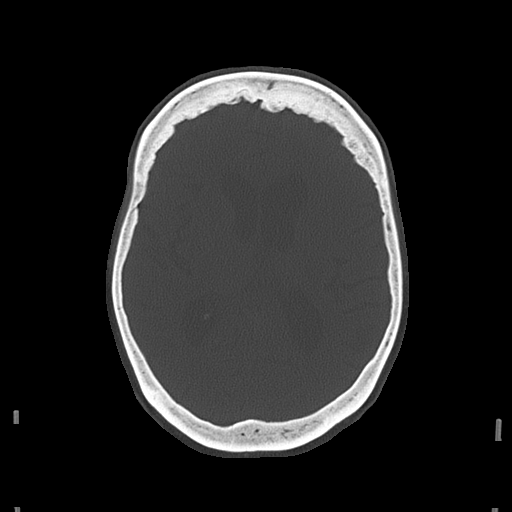
[im 34/51  bone]
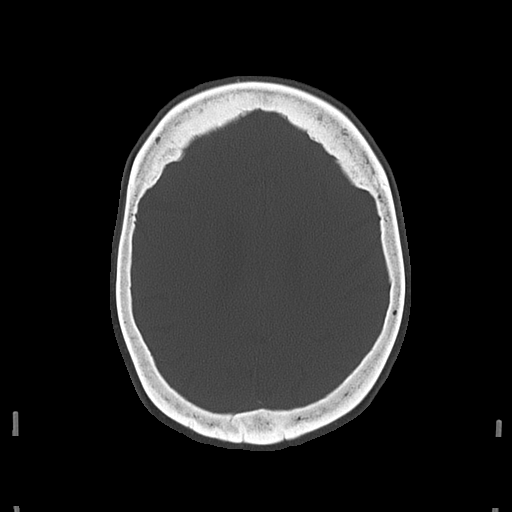
[im 39/51  bone]
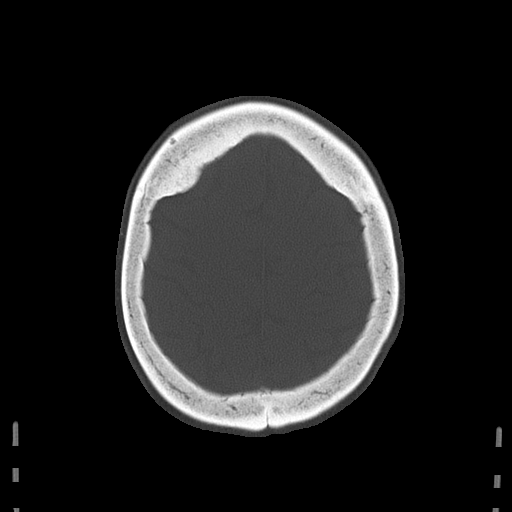
[im 45/51  bone]
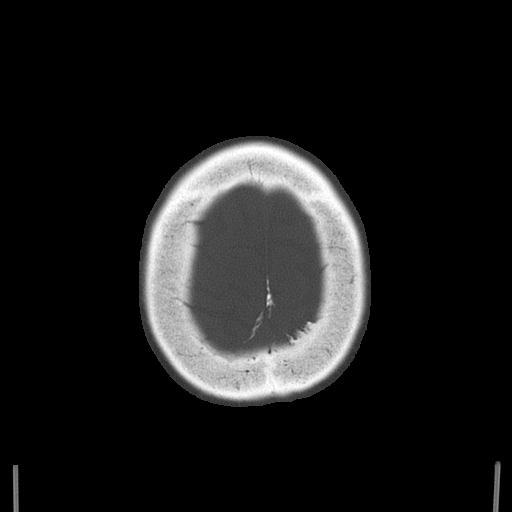

[17 of 30 positions shown; findings below may reference images not displayed]

FINDINGS: Mild atrophy. Mild ventricular enlargement consistent with atrophy.
Mild chronic microvascular ischemia. No acute infarct. Negative for
hemorrhage or mass. Visualized sinuses are clear. Left vertebral
artery calcification. Cavernous carotid calcification.
IMPRESSION: Atrophy and chronic microvascular ischemia.  No acute abnormality.

## 2015-02-18 IMAGING — DX DG CHEST 1V PORT
1 series · 1 of 1 positions shown · non-contrast
Comparison: 02/20/2014

CLINICAL DATA: Acute respiratory failure. Intubation. Urinary tract
infection. Parkinson's disease.

EXAM:
PORTABLE CHEST - 1 VIEW

[AP]
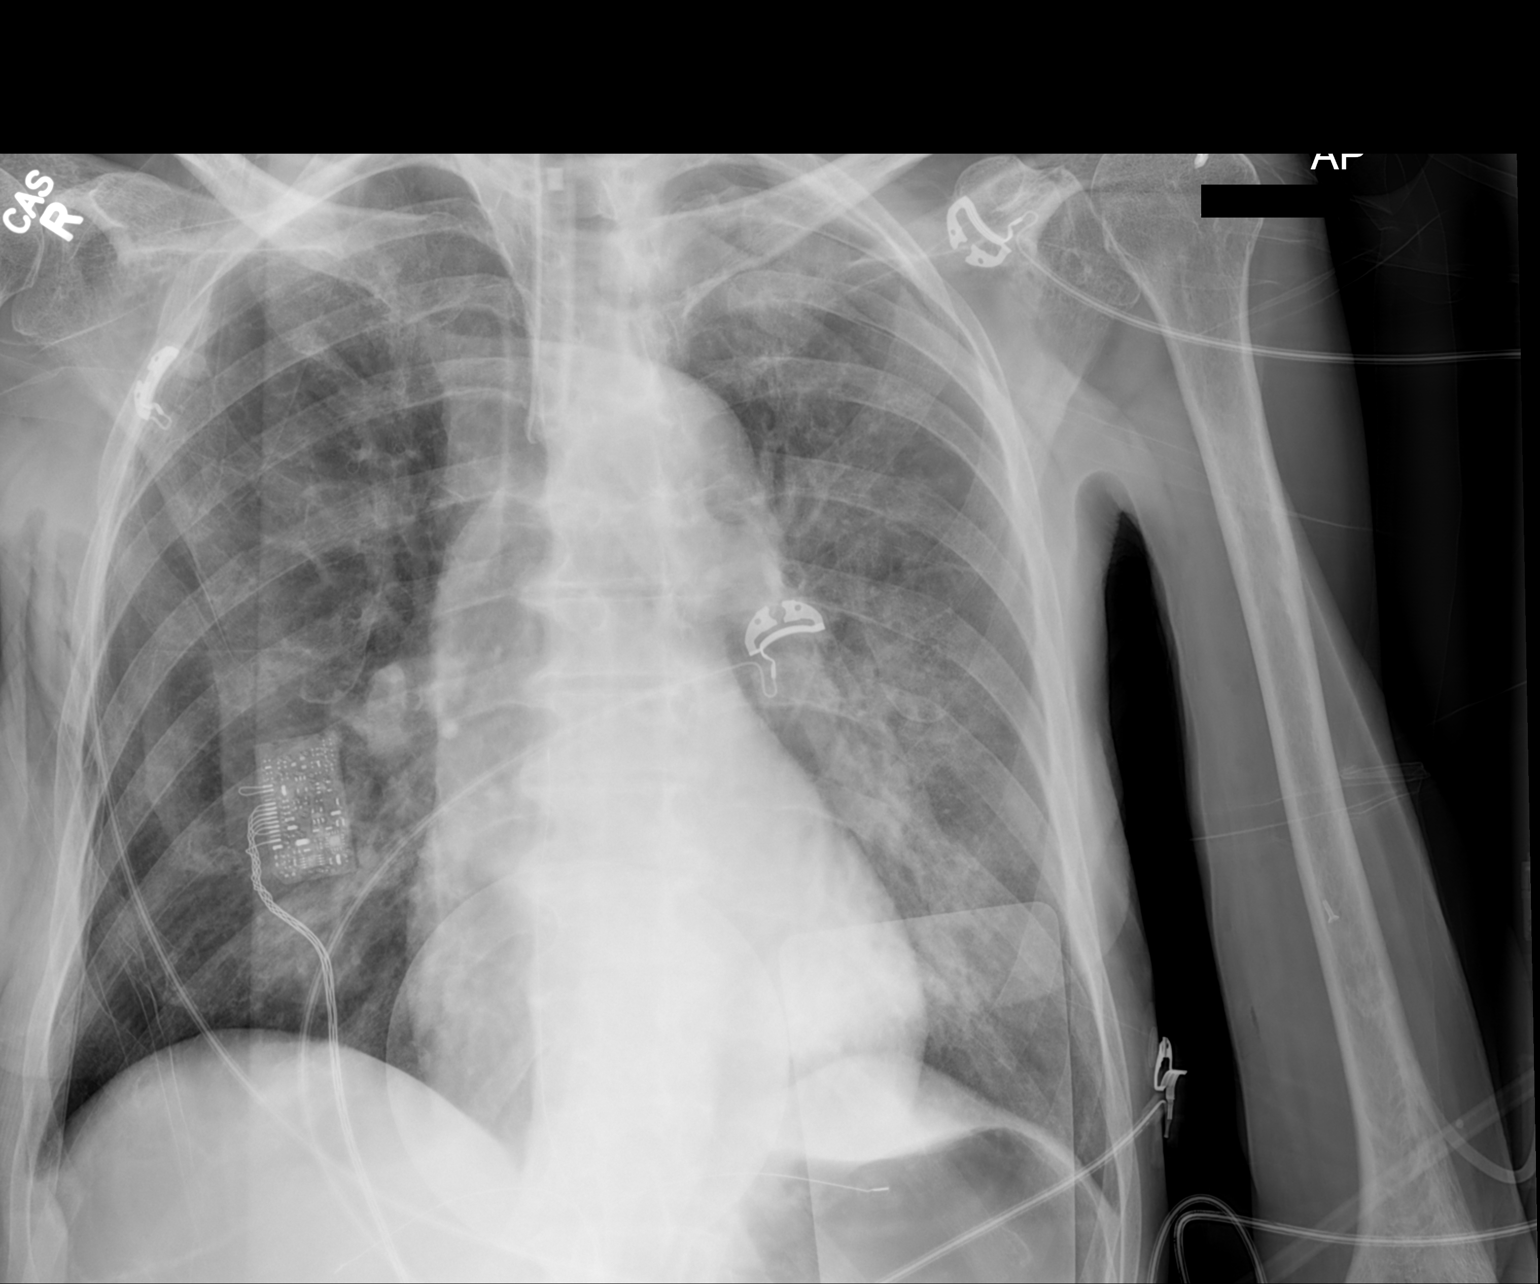

[1 of 1 positions shown; findings below may reference images not displayed]

FINDINGS: Endotracheal tube is seen in appropriate position with tip
approximately 3 cm above the carina.

External objects are seen overlying the left lower hemi thorax,
however asymmetric opacity is noted in the left lower lung,
suspicious for left lower lobe infiltrate. Right lung is clear. No
evidence of pleural effusion. Heart size is within normal limits.
Ectasia of the thoracic aorta remains stable.
IMPRESSION: Endotracheal tube in appropriate position.

Suspect left lower lobe infiltrate. Pneumonia cannot be excluded.
Radiographic followup is recommended.
